# Patient Record
Sex: Male | Born: 1935 | Race: White | Hispanic: No | Marital: Married | Smoking: Former smoker
Health system: Southern US, Community
[De-identification: ages and names within clinical notes are randomized; demographics above are authoritative.]

## PROBLEM LIST (undated history)

## (undated) DIAGNOSIS — R1310 Dysphagia, unspecified: Secondary | ICD-10-CM

## (undated) DIAGNOSIS — I639 Cerebral infarction, unspecified: Secondary | ICD-10-CM

## (undated) DIAGNOSIS — J309 Allergic rhinitis, unspecified: Secondary | ICD-10-CM

## (undated) DIAGNOSIS — C801 Malignant (primary) neoplasm, unspecified: Secondary | ICD-10-CM

## (undated) DIAGNOSIS — Z87442 Personal history of urinary calculi: Secondary | ICD-10-CM

## (undated) DIAGNOSIS — I10 Essential (primary) hypertension: Secondary | ICD-10-CM

## (undated) DIAGNOSIS — K5790 Diverticulosis of intestine, part unspecified, without perforation or abscess without bleeding: Secondary | ICD-10-CM

## (undated) DIAGNOSIS — E119 Type 2 diabetes mellitus without complications: Secondary | ICD-10-CM

## (undated) DIAGNOSIS — M1990 Unspecified osteoarthritis, unspecified site: Secondary | ICD-10-CM

## (undated) DIAGNOSIS — K219 Gastro-esophageal reflux disease without esophagitis: Secondary | ICD-10-CM

## (undated) HISTORY — PX: TONSILLECTOMY: SUR1361

## (undated) HISTORY — DX: Cerebral infarction, unspecified: I63.9

## (undated) HISTORY — PX: LITHOTRIPSY: SUR834

## (undated) HISTORY — PX: STAPEDES SURGERY: SHX789

---

## 2004-11-25 DIAGNOSIS — I639 Cerebral infarction, unspecified: Secondary | ICD-10-CM

## 2004-11-25 HISTORY — DX: Cerebral infarction, unspecified: I63.9

## 2005-06-25 ENCOUNTER — Ambulatory Visit: Payer: Self-pay | Admitting: Gastroenterology

## 2006-07-15 ENCOUNTER — Ambulatory Visit: Payer: Self-pay | Admitting: Gastroenterology

## 2006-11-25 HISTORY — PX: SINUS SURGERY WITH INSTATRAK: SHX5215

## 2009-09-19 ENCOUNTER — Ambulatory Visit: Payer: Self-pay | Admitting: Urology

## 2010-03-13 ENCOUNTER — Ambulatory Visit: Payer: Self-pay | Admitting: General Practice

## 2010-09-14 ENCOUNTER — Ambulatory Visit: Payer: Self-pay | Admitting: Urology

## 2012-01-15 ENCOUNTER — Ambulatory Visit: Payer: Self-pay | Admitting: Gastroenterology

## 2012-01-29 ENCOUNTER — Ambulatory Visit: Payer: Self-pay | Admitting: Urology

## 2012-02-03 ENCOUNTER — Ambulatory Visit: Payer: Self-pay | Admitting: Urology

## 2012-02-05 ENCOUNTER — Ambulatory Visit: Payer: Self-pay | Admitting: Urology

## 2012-02-05 LAB — CREATININE, SERUM
Creatinine: 0.79 mg/dL (ref 0.60–1.30)
EGFR (Non-African Amer.): >60

## 2012-02-11 ENCOUNTER — Ambulatory Visit: Payer: Self-pay | Admitting: Urology

## 2012-02-14 LAB — PATHOLOGY REPORT

## 2012-05-29 ENCOUNTER — Ambulatory Visit: Payer: Self-pay | Admitting: Urology

## 2012-06-04 ENCOUNTER — Ambulatory Visit: Payer: Self-pay | Admitting: Urology

## 2012-06-18 ENCOUNTER — Ambulatory Visit: Payer: Self-pay | Admitting: Urology

## 2012-09-09 ENCOUNTER — Ambulatory Visit: Payer: Self-pay | Admitting: Urology

## 2012-09-09 DIAGNOSIS — N200 Calculus of kidney: Secondary | ICD-10-CM | POA: Insufficient documentation

## 2012-09-09 DIAGNOSIS — C672 Malignant neoplasm of lateral wall of bladder: Secondary | ICD-10-CM | POA: Insufficient documentation

## 2012-09-09 DIAGNOSIS — N401 Enlarged prostate with lower urinary tract symptoms: Secondary | ICD-10-CM | POA: Insufficient documentation

## 2012-09-09 DIAGNOSIS — D075 Carcinoma in situ of prostate: Secondary | ICD-10-CM | POA: Insufficient documentation

## 2012-09-09 DIAGNOSIS — N411 Chronic prostatitis: Secondary | ICD-10-CM | POA: Insufficient documentation

## 2012-09-09 DIAGNOSIS — R339 Retention of urine, unspecified: Secondary | ICD-10-CM | POA: Insufficient documentation

## 2012-09-09 DIAGNOSIS — R972 Elevated prostate specific antigen [PSA]: Secondary | ICD-10-CM | POA: Insufficient documentation

## 2012-12-09 ENCOUNTER — Ambulatory Visit: Payer: Self-pay | Admitting: Urology

## 2013-07-30 ENCOUNTER — Ambulatory Visit: Payer: Self-pay | Admitting: Urology

## 2013-12-29 DIAGNOSIS — N3941 Urge incontinence: Secondary | ICD-10-CM | POA: Insufficient documentation

## 2014-07-04 DIAGNOSIS — C679 Malignant neoplasm of bladder, unspecified: Secondary | ICD-10-CM | POA: Insufficient documentation

## 2014-07-04 DIAGNOSIS — E119 Type 2 diabetes mellitus without complications: Secondary | ICD-10-CM | POA: Insufficient documentation

## 2014-07-04 DIAGNOSIS — I10 Essential (primary) hypertension: Secondary | ICD-10-CM | POA: Insufficient documentation

## 2014-07-04 DIAGNOSIS — E1129 Type 2 diabetes mellitus with other diabetic kidney complication: Secondary | ICD-10-CM | POA: Insufficient documentation

## 2014-07-20 DIAGNOSIS — N029 Recurrent and persistent hematuria with unspecified morphologic changes: Secondary | ICD-10-CM | POA: Insufficient documentation

## 2014-08-23 DIAGNOSIS — K5730 Diverticulosis of large intestine without perforation or abscess without bleeding: Secondary | ICD-10-CM | POA: Insufficient documentation

## 2014-08-23 DIAGNOSIS — Z86010 Personal history of colonic polyps: Secondary | ICD-10-CM | POA: Insufficient documentation

## 2014-09-16 DIAGNOSIS — Z85828 Personal history of other malignant neoplasm of skin: Secondary | ICD-10-CM | POA: Insufficient documentation

## 2015-04-11 DIAGNOSIS — D075 Carcinoma in situ of prostate: Secondary | ICD-10-CM | POA: Insufficient documentation

## 2015-04-20 ENCOUNTER — Encounter: Payer: Self-pay | Admitting: *Deleted

## 2015-04-21 ENCOUNTER — Ambulatory Visit: Payer: Medicare Other | Admitting: Anesthesiology

## 2015-04-21 ENCOUNTER — Encounter
Admission: RE | Disposition: A | Discharge status: Home / Self Care | Payer: Self-pay | Source: Ambulatory Visit | Attending: Gastroenterology

## 2015-04-21 ENCOUNTER — Encounter: Payer: Self-pay | Admitting: *Deleted

## 2015-04-21 ENCOUNTER — Ambulatory Visit
Admission: RE | Admit: 2015-04-21 | Discharge: 2015-04-21 | Disposition: A | Discharge status: Home / Self Care | Payer: Medicare Other | Source: Ambulatory Visit | Attending: Gastroenterology | Admitting: Gastroenterology

## 2015-04-21 DIAGNOSIS — Z79899 Other long term (current) drug therapy: Secondary | ICD-10-CM | POA: Insufficient documentation

## 2015-04-21 DIAGNOSIS — Z8551 Personal history of malignant neoplasm of bladder: Secondary | ICD-10-CM | POA: Diagnosis not present

## 2015-04-21 DIAGNOSIS — Z8546 Personal history of malignant neoplasm of prostate: Secondary | ICD-10-CM | POA: Insufficient documentation

## 2015-04-21 DIAGNOSIS — E119 Type 2 diabetes mellitus without complications: Secondary | ICD-10-CM | POA: Insufficient documentation

## 2015-04-21 DIAGNOSIS — K3189 Other diseases of stomach and duodenum: Secondary | ICD-10-CM | POA: Diagnosis not present

## 2015-04-21 DIAGNOSIS — K5790 Diverticulosis of intestine, part unspecified, without perforation or abscess without bleeding: Secondary | ICD-10-CM | POA: Insufficient documentation

## 2015-04-21 DIAGNOSIS — I10 Essential (primary) hypertension: Secondary | ICD-10-CM | POA: Insufficient documentation

## 2015-04-21 DIAGNOSIS — Z87891 Personal history of nicotine dependence: Secondary | ICD-10-CM | POA: Insufficient documentation

## 2015-04-21 DIAGNOSIS — K219 Gastro-esophageal reflux disease without esophagitis: Secondary | ICD-10-CM | POA: Diagnosis not present

## 2015-04-21 DIAGNOSIS — R1310 Dysphagia, unspecified: Secondary | ICD-10-CM | POA: Insufficient documentation

## 2015-04-21 HISTORY — DX: Allergic rhinitis, unspecified: J30.9

## 2015-04-21 HISTORY — DX: Dysphagia, unspecified: R13.10

## 2015-04-21 HISTORY — PX: ESOPHAGOGASTRODUODENOSCOPY: SHX5428

## 2015-04-21 HISTORY — DX: Malignant (primary) neoplasm, unspecified: C80.1

## 2015-04-21 HISTORY — DX: Type 2 diabetes mellitus without complications: E11.9

## 2015-04-21 HISTORY — DX: Gastro-esophageal reflux disease without esophagitis: K21.9

## 2015-04-21 HISTORY — DX: Diverticulosis of intestine, part unspecified, without perforation or abscess without bleeding: K57.90

## 2015-04-21 HISTORY — DX: Essential (primary) hypertension: I10

## 2015-04-21 LAB — GLUCOSE, CAPILLARY: Glucose-Capillary: 169 mg/dL — ABNORMAL HIGH (ref 65–99)

## 2015-04-21 SURGERY — EGD (ESOPHAGOGASTRODUODENOSCOPY)
Anesthesia: General

## 2015-04-21 MED ORDER — FENTANYL CITRATE (PF) 100 MCG/2ML IJ SOLN
INTRAMUSCULAR | Status: DC | PRN
Start: 2015-04-21 — End: 2015-04-21
  Administered 2015-04-21: 50 ug via INTRAVENOUS

## 2015-04-21 MED ORDER — SODIUM CHLORIDE 0.9 % IV SOLN: INTRAVENOUS | Status: DC

## 2015-04-21 MED ORDER — SODIUM CHLORIDE 0.9 % IV SOLN
INTRAVENOUS | Status: DC
  Administered 2015-04-21: 08:00:00 via INTRAVENOUS

## 2015-04-21 MED ORDER — PROPOFOL INFUSION 10 MG/ML OPTIME
INTRAVENOUS | Status: DC | PRN
  Administered 2015-04-21: 100 ug/kg/min via INTRAVENOUS

## 2015-04-21 NOTE — Anesthesia Procedure Notes (Signed)
Performed by: Jonna Clark Pre-anesthesia Checklist: Patient identified, Emergency Drugs available, Suction available, Patient being monitored and Timeout performed Patient Re-evaluated:Patient Re-evaluated prior to inductionOxygen Delivery Method: Nasal cannula

## 2015-04-21 NOTE — Op Note (Signed)
[facility] Gastroenterology Patient Name: Casey Adkins Procedure Date: 04/21/2015 8:00 AM MRN: [id number] Account #: [id number] Date of Birth: 25-Apr-1934 Admit Type: Outpatient Age: 81 Room: [facility] ENDO ROOM 4 Gender: Male Note Status: Finalized Procedure:         Upper GI endoscopy Indications:       Dysphagia Providers:         Lupita Dawn. Candace Cruise, MD Referring MD:      Rusty Aus, MD (Referring MD) Medicines:         Monitored Anesthesia Care Complications:     No immediate complications. Procedure:         Pre-Anesthesia Assessment:                    - Prior to the procedure, a History and Physical was                     performed, and patient medications, allergies and                     sensitivities were reviewed. The patient's tolerance of                     previous anesthesia was reviewed.                    - The risks and benefits of the procedure and the sedation                     options and risks were discussed with the patient. All                     questions were answered and informed consent was obtained.                    - After reviewing the risks and benefits, the patient was                     deemed in satisfactory condition to undergo the procedure.                    After obtaining informed consent, the endoscope was passed                     under direct vision. Throughout the procedure, the                     patient's blood pressure, pulse, and oxygen saturations                     were monitored continuously. The Olympus GIF-160 endoscope                     (S#. [id number]) was introduced through the mouth, and                     advanced to the second part of duodenum. The upper GI                     endoscopy was accomplished without difficulty. The patient                     tolerated the procedure well. Findings:      No endoscopic abnormality was evident in the esophagus to explain the  patient's complaint of  dysphagia. It was decided, however, to proceed       with dilation of the entire esophagus. The scope was withdrawn. Dilation       was performed with a Maloney dilator with mild resistance at 79 Fr.      A single umbilicated lesion measuring small in diameter was found in the       gastric antrum. Biopsies were taken with a cold forceps for histology.      The exam was otherwise without abnormality.      The examined duodenum was normal. Impression:        - No endoscopic esophageal abnormality to explain                     patient's dysphagia. Esophagus dilated. Dilated.                    - A single lesion suspicious for aberrant pancreas was                     found in the stomach. Biopsied.                    - The examination was otherwise normal.                    - Normal examined duodenum. Recommendation:    - Discharge patient to home.                    - Observe patient's clinical course.                    - Await pathology results.                    - The findings and recommendations were discussed with the                     patient. Procedure Code(s): --- Professional ---                    [phone], Esophagogastroduodenoscopy, flexible, transoral;                     with biopsy, single or multiple                    43450, Dilation of esophagus, by unguided sound or bougie,                     single or multiple passes Diagnosis Code(s): --- Professional ---                    R13.10, Dysphagia, unspecified                    K31.9, Disease of stomach and duodenum, unspecified CPT copyright 2014 American Medical Association. All rights reserved. The codes documented in this report are preliminary and upon coder review may  be revised to meet current compliance requirements. Hulen Luster, MD 04/21/2015 8:22:25 AM This report has been signed electronically. Number of Addenda: 0 Note Initiated On: 04/21/2015 8:00 AM      [facility]

## 2015-04-21 NOTE — H&P (Signed)
Primary Care Physician:  Rusty Aus., MD Primary Gastroenterologist:  Dr. Candace Cruise  Pre-Procedure History & Physical: HPI:  Casey Adkins is a 81 y.o. male is here for an EGD with dilation.   Past Medical History  Diagnosis Date  . Hypertension   . Diabetes mellitus without complication   . GERD (gastroesophageal reflux disease)   . Diverticulosis   . Cancer     Bladder Cancer  . Cancer     Prostate Cancer  . Dysphagia   . Allergic rhinitis     Past Surgical History  Procedure Laterality Date  . Tonsillectomy      Prior to Admission medications   Medication Sig Start Date End Date Taking? Authorizing Provider  acetaminophen (TYLENOL) 500 MG tablet Take 500 mg by mouth every 6 (six) hours as needed for moderate pain.   Yes Historical Provider, MD  Calcium Carbonate-Vitamin D (CALCIUM-D) 600-400 MG-UNIT TABS Take 1 tablet by mouth daily.   Yes Historical Provider, MD  finasteride (PROSCAR) 5 MG tablet Take 1 tablet by mouth daily. 02/16/15  Yes Historical Provider, MD  glimepiride (AMARYL) 4 MG tablet Take 1 tablet by mouth daily before breakfast. 03/24/15  Yes Historical Provider, MD  lisinopril (PRINIVIL,ZESTRIL) 10 MG tablet Take 10 mg by mouth 2 (two) times daily.   Yes Historical Provider, MD  magnesium gluconate (MAGONATE) 500 MG tablet Take 500 mg by mouth daily.   Yes Historical Provider, MD  metFORMIN (GLUCOPHAGE) 500 MG tablet Take 1,000 mg by mouth 2 (two) times daily. 02/10/15  Yes Historical Provider, MD  Multiple Vitamins-Minerals (PRESERVISION AREDS PO) Take 1 tablet by mouth 2 (two) times daily.   Yes Historical Provider, MD  tamsulosin (FLOMAX) 0.4 MG CAPS capsule Take 1 capsule by mouth daily. 01/25/15  Yes Historical Provider, MD  fluticasone (FLONASE) 50 MCG/ACT nasal spray Place 2 sprays into both nostrils daily.    Historical Provider, MD  propranolol (INDERAL) 20 MG tablet Take 1 tablet by mouth 2 (two) times daily. 01/06/15   Historical Provider, MD     Allergies as of 04/07/2015 - Review Complete 04/07/2015  Allergen Reaction Noted  . Sulfur Swelling 04/07/2015    History reviewed. No pertinent family history.  History   Social History  . Marital Status: Married    Spouse Name: N/A  . Number of Children: N/A  . Years of Education: N/A   Occupational History  . Not on file.   Social History Main Topics  . Smoking status: Former Research scientist (life sciences)  . Smokeless tobacco: Never Used  . Alcohol Use: No  . Drug Use: No  . Sexual Activity: Not on file   Other Topics Concern  . Not on file   Social History Narrative    Review of Systems: See HPI, otherwise negative ROS  Physical Exam: BP 178/98 mmHg  Pulse 64  Temp(Src) 98.9 F (37.2 C) (Tympanic)  Resp 20  Ht 5\' 10"  (1.778 m)  Wt 95.255 kg (210 lb)  BMI 30.13 kg/m2  SpO2 98% General:   Alert,  pleasant and cooperative in NAD Head:  Normocephalic and atraumatic. Neck:  Supple; no masses or thyromegaly. Lungs:  Clear throughout to auscultation.    Heart:  Regular rate and rhythm. Abdomen:  Soft, nontender and nondistended. Normal bowel sounds, without guarding, and without rebound.   Neurologic:  Alert and  oriented x4;  grossly normal neurologically.  Impression/Plan: Casey Adkins is here for an EGD to be performed for dysphagia.  Risks,  benefits, limitations, and alternatives regarding EGD have been reviewed with the patient.  Questions have been answered.  All parties agreeable.   Kemontae Dunklee, Lupita Dawn, MD  04/21/2015, 8:02 AM

## 2015-04-21 NOTE — Transfer of Care (Signed)
Immediate Anesthesia Transfer of Care Note  Patient: Casey Adkins  Procedure(s) Performed: Procedure(s): ESOPHAGOGASTRODUODENOSCOPY (EGD) (N/A)  Patient Location: PACU and Endoscopy Unit  Anesthesia Type:General  Level of Consciousness: awake, alert  and oriented  Airway & Oxygen Therapy: Patient Spontanous Breathing and Patient connected to nasal cannula oxygen  Post-op Assessment: Report given to RN and Post -op Vital signs reviewed and stable  Post vital signs: Reviewed and stable  Last Vitals:  Filed Vitals:   04/21/15 0825  BP: 123/68  Pulse: 67  Temp: 36.8 C  Resp: 13    Complications: No apparent anesthesia complications

## 2015-04-21 NOTE — Anesthesia Postprocedure Evaluation (Signed)
  Anesthesia Post-op Note  Patient: Casey Adkins  Procedure(s) Performed: Procedure(s): ESOPHAGOGASTRODUODENOSCOPY (EGD) (N/A)  Anesthesia type:General  Patient location: PACU  Post pain: Pain level controlled  Post assessment: Post-op Vital signs reviewed, Patient's Cardiovascular Status Stable, Respiratory Function Stable, Patent Airway and No signs of Nausea or vomiting  Post vital signs: Reviewed and stable  Last Vitals:  Filed Vitals:   04/21/15 0825  BP: 123/68  Pulse: 67  Temp: 36.8 C  Resp: 13    Level of consciousness: awake, alert  and patient cooperative  Complications: No apparent anesthesia complications

## 2015-04-21 NOTE — Anesthesia Preprocedure Evaluation (Signed)
Anesthesia Evaluation  Patient identified by MRN, date of birth, ID band Patient awake    Reviewed: Allergy & Precautions, H&P , NPO status , Patient's Chart, lab work & pertinent test results, reviewed documented beta blocker date and time   Airway Mallampati: III  TM Distance: >3 FB Neck ROM: full    Dental no notable dental hx.    Pulmonary neg pulmonary ROS, former smoker,  breath sounds clear to auscultation  Pulmonary exam normal       Cardiovascular Exercise Tolerance: Good hypertension, negative cardio ROS  Rhythm:regular Rate:Normal     Neuro/Psych negative neurological ROS  negative psych ROS   GI/Hepatic negative GI ROS, Neg liver ROS, GERD-  ,  Endo/Other  negative endocrine ROSdiabetes  Renal/GU negative Renal ROS  negative genitourinary   Musculoskeletal   Abdominal   Peds  Hematology negative hematology ROS (+)   Anesthesia Other Findings   Reproductive/Obstetrics negative OB ROS                             Anesthesia Physical Anesthesia Plan  ASA: III  Anesthesia Plan: General   Post-op Pain Management:    Induction:   Airway Management Planned:   Additional Equipment:   Intra-op Plan:   Post-operative Plan:   Informed Consent: I have reviewed the patients History and Physical, chart, labs and discussed the procedure including the risks, benefits and alternatives for the proposed anesthesia with the patient or authorized representative who has indicated his/her understanding and acceptance.   Dental Advisory Given  Plan Discussed with: CRNA  Anesthesia Plan Comments:         Anesthesia Quick Evaluation

## 2015-04-25 ENCOUNTER — Encounter: Payer: Self-pay | Admitting: Gastroenterology

## 2015-04-25 LAB — SURGICAL PATHOLOGY

## 2016-05-10 ENCOUNTER — Encounter: Payer: Self-pay | Admitting: Emergency Medicine

## 2016-05-10 ENCOUNTER — Emergency Department
Admission: EM | Admit: 2016-05-10 | Discharge: 2016-05-10 | Disposition: A | Discharge status: Home / Self Care | Payer: Medicare Other | Attending: Emergency Medicine | Admitting: Emergency Medicine

## 2016-05-10 ENCOUNTER — Emergency Department: Payer: Medicare Other

## 2016-05-10 DIAGNOSIS — Z79899 Other long term (current) drug therapy: Secondary | ICD-10-CM | POA: Diagnosis not present

## 2016-05-10 DIAGNOSIS — E119 Type 2 diabetes mellitus without complications: Secondary | ICD-10-CM | POA: Diagnosis not present

## 2016-05-10 DIAGNOSIS — Z7984 Long term (current) use of oral hypoglycemic drugs: Secondary | ICD-10-CM | POA: Insufficient documentation

## 2016-05-10 DIAGNOSIS — M6289 Other specified disorders of muscle: Secondary | ICD-10-CM

## 2016-05-10 DIAGNOSIS — Z8551 Personal history of malignant neoplasm of bladder: Secondary | ICD-10-CM | POA: Diagnosis not present

## 2016-05-10 DIAGNOSIS — I10 Essential (primary) hypertension: Secondary | ICD-10-CM | POA: Insufficient documentation

## 2016-05-10 DIAGNOSIS — Z5181 Encounter for therapeutic drug level monitoring: Secondary | ICD-10-CM | POA: Diagnosis not present

## 2016-05-10 DIAGNOSIS — Z8546 Personal history of malignant neoplasm of prostate: Secondary | ICD-10-CM | POA: Diagnosis not present

## 2016-05-10 DIAGNOSIS — Z87891 Personal history of nicotine dependence: Secondary | ICD-10-CM | POA: Insufficient documentation

## 2016-05-10 DIAGNOSIS — R202 Paresthesia of skin: Secondary | ICD-10-CM | POA: Diagnosis present

## 2016-05-10 DIAGNOSIS — R531 Weakness: Secondary | ICD-10-CM | POA: Diagnosis not present

## 2016-05-10 DIAGNOSIS — R29898 Other symptoms and signs involving the musculoskeletal system: Secondary | ICD-10-CM

## 2016-05-10 LAB — CBC
HEMATOCRIT: 42.5 % (ref 40.0–52.0)
HEMOGLOBIN: 14.3 g/dL (ref 13.0–18.0)
MCH: 31.1 pg (ref 26.0–34.0)
MCHC: 33.7 g/dL (ref 32.0–36.0)
MCV: 92.2 fL (ref 80.0–100.0)
Platelets: 150 10*3/uL (ref 150–440)
RBC: 4.61 MIL/uL (ref 4.40–5.90)
RDW: 14.7 % — ABNORMAL HIGH (ref 11.5–14.5)
WBC: 7.4 10*3/uL (ref 3.8–10.6)

## 2016-05-10 LAB — DIFFERENTIAL
BASOS ABS: 0.1 10*3/uL (ref 0–0.1)
Basophils Relative: 1 %
EOS ABS: 0.2 10*3/uL (ref 0–0.7)
Eosinophils Relative: 3 %
LYMPHS ABS: 1.9 10*3/uL (ref 1.0–3.6)
LYMPHS PCT: 26 %
MONOS PCT: 5 %
Monocytes Absolute: 0.4 10*3/uL (ref 0.2–1.0)
NEUTROS ABS: 4.8 10*3/uL (ref 1.4–6.5)
NEUTROS PCT: 65 %

## 2016-05-10 LAB — COMPREHENSIVE METABOLIC PANEL
ALBUMIN: 3.9 g/dL (ref 3.5–5.0)
ALT: 23 U/L (ref 17–63)
AST: 32 U/L (ref 15–41)
Alkaline Phosphatase: 53 U/L (ref 38–126)
Anion gap: 10 (ref 5–15)
BILIRUBIN TOTAL: 0.7 mg/dL (ref 0.3–1.2)
BUN: 15 mg/dL (ref 6–20)
CO2: 29 mmol/L (ref 22–32)
CREATININE: 0.89 mg/dL (ref 0.61–1.24)
Calcium: 9.2 mg/dL (ref 8.9–10.3)
Chloride: 100 mmol/L — ABNORMAL LOW (ref 101–111)
GFR calc Af Amer: >60 mL/min (ref >60)
GLUCOSE: 278 mg/dL — AB (ref 65–99)
POTASSIUM: 4.9 mmol/L (ref 3.5–5.1)
Sodium: 139 mmol/L (ref 135–145)
TOTAL PROTEIN: 7.6 g/dL (ref 6.5–8.1)

## 2016-05-10 LAB — APTT: APTT: 31 s (ref 24–36)

## 2016-05-10 LAB — URINE DRUG SCREEN, QUALITATIVE (ARMC ONLY)
AMPHETAMINES, UR SCREEN: NOT DETECTED
BENZODIAZEPINE, UR SCRN: NOT DETECTED
Barbiturates, Ur Screen: NOT DETECTED
CANNABINOID 50 NG, UR ~~LOC~~: NOT DETECTED
Cocaine Metabolite,Ur ~~LOC~~: NOT DETECTED
MDMA (ECSTASY) UR SCREEN: NOT DETECTED
Methadone Scn, Ur: NOT DETECTED
OPIATE, UR SCREEN: NOT DETECTED
PHENCYCLIDINE (PCP) UR S: NOT DETECTED
Tricyclic, Ur Screen: NOT DETECTED

## 2016-05-10 LAB — PROTIME-INR
INR: 1
Prothrombin Time: 13.4 seconds (ref 11.4–15.0)

## 2016-05-10 LAB — TROPONIN I

## 2016-05-10 MED ORDER — ASPIRIN 81 MG PO CHEW
324.0000 mg | CHEWABLE_TABLET | Freq: Once | ORAL | Status: AC
  Administered 2016-05-10: 324 mg via ORAL
  Filled 2016-05-10: qty 4

## 2016-05-10 NOTE — ED Notes (Signed)
NAD noted at time of D/C. Pt denies questions or concerns. Pt ambulatory to the lobby at this time.  

## 2016-05-10 NOTE — ED Provider Notes (Signed)
[facility] Emergency Department Provider Note  ____________________________________________  Time seen: Approximately 10:42 AM  I have reviewed the triage vital signs and the nursing notes.   HISTORY  Chief Complaint Numbness    HPI Casey Adkins is a 82 y.o. male presents for evaluation of weakness in his left hand. This reports yesterday and possibly on Wednesday he noticed that his left hand fingers felt weak. Driving the car felt very strange, as though he can't fully use his left hand and wrist. He has not noticed any numbness, but he does report that the hand is not working correctly. He is also felt a little "off balance" the last couple of days. No headache or neck pain. No chest pain or shortness of breath. He otherwise feels okay, but is concerned because his wife told him she thinks she may have had a "stroke".  He is a history of high blood pressure and diabetes. He does not take any blood thinners or aspirin.   Past Medical History  Diagnosis Date  . Hypertension   . Diabetes mellitus without complication ([place])   . GERD (gastroesophageal reflux disease)   . Diverticulosis   . Cancer [facility])     Bladder Cancer  . Cancer [facility])     Prostate Cancer  . Dysphagia   . Allergic rhinitis     There are no active problems to display for this patient.   Past Surgical History  Procedure Laterality Date  . Tonsillectomy    . Esophagogastroduodenoscopy N/A 04/21/2015    Procedure: ESOPHAGOGASTRODUODENOSCOPY (EGD);  Surgeon: Hulen Luster, MD;  Location: [facility] ENDOSCOPY;  Service: Gastroenterology;  Laterality: N/A;    Current Outpatient Rx  Name  Route  Sig  Dispense  Refill  . acetaminophen (TYLENOL) 500 MG tablet   Oral   Take 500 mg by mouth every 6 (six) hours as needed for moderate pain.         . Calcium Carbonate-Vitamin D (CALCIUM-D) 600-400 MG-UNIT TABS   Oral   Take 1 tablet by mouth daily.         . finasteride (PROSCAR) 5 MG  tablet   Oral   Take 1 tablet by mouth daily.      3   . fluticasone (FLONASE) 50 MCG/ACT nasal spray   Each Nare   Place 2 sprays into both nostrils daily.         Marland Kitchen glimepiride (AMARYL) 4 MG tablet   Oral   Take 1 tablet by mouth daily before breakfast.      3   . lisinopril (PRINIVIL,ZESTRIL) 10 MG tablet   Oral   Take 10 mg by mouth 2 (two) times daily.         . magnesium gluconate (MAGONATE) 500 MG tablet   Oral   Take 500 mg by mouth daily.         . metFORMIN (GLUCOPHAGE) 500 MG tablet   Oral   Take 1,000 mg by mouth 2 (two) times daily.      12   . Multiple Vitamins-Minerals (PRESERVISION AREDS PO)   Oral   Take 1 tablet by mouth 2 (two) times daily.         . propranolol (INDERAL) 20 MG tablet   Oral   Take 1 tablet by mouth 2 (two) times daily.      3   . tamsulosin (FLOMAX) 0.4 MG CAPS capsule   Oral   Take 1 capsule by mouth daily.  3     Allergies Prilosec and Sulfur  History reviewed. No pertinent family history.  Social History Social History  Substance Use Topics  . Smoking status: Former Research scientist (life sciences)  . Smokeless tobacco: Never Used  . Alcohol Use: No    Review of Systems Constitutional: No fever/chills Eyes: No visual changes. ENT: No sore throat. Cardiovascular: Denies chest pain. Respiratory: Denies shortness of breath. Gastrointestinal: No abdominal pain.  No nausea, no vomiting.  No diarrhea.  No constipation. Genitourinary: Negative for dysuria. Musculoskeletal: Negative for back pain.See history of present illness regarding left arm. Skin: Negative for rash. Neurological: Negative for headaches or numbness.  10-point ROS otherwise negative.  ____________________________________________   PHYSICAL EXAM:  VITAL SIGNS: ED Triage Vitals  Enc Vitals Group     BP 05/10/16 1022 150/70 mmHg     Pulse Rate 05/10/16 1022 64     Resp 05/10/16 1022 18     Temp --      Temp src --      SpO2 05/10/16 1022 95 %      Weight 05/10/16 1022 215 lb (97.523 kg)     Height 05/10/16 1022 5\' 10"  (1.778 m)     Head Cir --      Peak Flow --      Pain Score 05/10/16 1023 0     Pain Loc --      Pain Edu? --      Excl. in [place]? --    Constitutional: Alert and oriented. Well appearing and in no acute distress. Eyes: Conjunctivae are normal. PERRL. EOMI. Head: Atraumatic. Nose: No congestion/rhinnorhea. Mouth/Throat: Mucous membranes are moist.  Oropharynx non-erythematous. Neck: No stridor.   Cardiovascular: Normal rate, regular rhythm. Grossly normal heart sounds.  Good peripheral circulation. Respiratory: Normal respiratory effort.  No retractions. Lungs CTAB. Gastrointestinal: Soft and nontender. No distention. No abdominal bruits. No CVA tenderness. Musculoskeletal: No lower extremity tenderness nor edema.  No joint effusions. Neurologic:  Normal speech and language.   NIH score equals 2, performed by me at bedside. The patient has mild left pronator drift. The patient has normal cranial nerve exam. Extraocular movements are normal. Visual fields are normal. Patient has 5 out of 5 strength in all extremities except minimal weakness in the left hand. There is no numbness or gross, acute sensory abnormality in the extremities bilaterally. No speech disturbance. No dysarthria. No aphasia. Mild left upper extremity ataxia.. Normal finger nose finger bilat. Patient speaking in full and clear sentences.   Skin:  Skin is warm, dry and intact. No rash noted. Psychiatric: Mood and affect are normal. Speech and behavior are normal.  ____________________________________________   LABS (all labs ordered are listed, but only abnormal results are displayed)  Labs Reviewed  CBC - Abnormal; Notable for the following:    RDW 14.7 (*)    All other components within normal limits  COMPREHENSIVE METABOLIC PANEL - Abnormal; Notable for the following:    Chloride 100 (*)    Glucose, Bld 278 (*)    All other  components within normal limits  PROTIME-INR  APTT  DIFFERENTIAL  TROPONIN I  URINE DRUG SCREEN, QUALITATIVE ([facility] ONLY)   ____________________________________________  EKG  ED ECG REPORT I, Mylin Hirano, the attending physician, personally viewed and interpreted this ECG.  Date: 05/10/2016 EKG Time: 10:30 Rate: 70 Rhythm: normal sinus rhythm QRS Axis: normal Intervals: normal ST/T Wave abnormalities: There is J-point elevation in V2 with no evidence of convexity Conduction Disturbances: none Narrative Interpretation:  unremarkable  Repeat EKG performed at 1430 Normal sinus rhythm, heart rate 70 Care is 100 PR 175 QTC 420 Again seen J-point elevation in V2, stable from previous. Patient complains of no chest pain. He has negative troponin and symptoms of numbness and weakness in the left handed been ongoing for greater than 2 days. I doubt cardiac disease is acute cause of today's symptoms  ____________________________________________  RADIOLOGY  CT Head Wo Contrast (Final result) Result time: 05/10/16 11:06:29   Final result by Rad Results In Interface (05/10/16 11:06:29)   Narrative:   CLINICAL DATA: Left hand numbness for 2 days, initial encounter  EXAM: CT HEAD WITHOUT CONTRAST  TECHNIQUE: Contiguous axial images were obtained from the base of the skull through the vertex without intravenous contrast.  COMPARISON: None.  FINDINGS: Bony calvarium is intact. Mild atrophic changes and chronic white matter ischemic change is noted. No findings to suggest acute hemorrhage, acute infarction or space occupying mass lesion are seen.  IMPRESSION: Chronic atrophic and ischemic changes. No acute abnormality noted.   Electronically Signed By: Inez Catalina M.D. On: 05/10/2016 11:06      DG Chest 2 View (Final result) Result time: 05/10/16 13:24:35   Final result by Rad Results In Interface (05/10/16 13:24:35)   Narrative:   CLINICAL DATA: Left  hand weakness for 2-3 days. History of bladder or prostate cancer. Evaluate for mass in left upper chest.  EXAM: CHEST 2 VIEW  COMPARISON: None.  FINDINGS: Lungs are clear without focal airspace disease or pulmonary edema. No evidence for a large mass. Heart and mediastinum are within normal limits. Atherosclerotic calcifications at the aortic arch. Multilevel degenerative changes in the thoracic spine. No large pleural effusions.  IMPRESSION: No active cardiopulmonary disease.   Electronically Signed By: Markus Daft M.D. On: 05/10/2016 13:24        ____________________________________________   PROCEDURES  Procedure(s) performed: None  Critical Care performed: No  Patient's presentation is well outside of TPA window for possible stroke. ____________________________________________   INITIAL IMPRESSION / ASSESSMENT AND PLAN / ED COURSE  Pertinent labs & imaging results that were available during my care of the patient were reviewed by me and considered in my medical decision making (see chart for details).  ----------------------------------------- 11:50 AM on 05/10/2016 -----------------------------------------  Consult placed to Dr. Doy Mince. Dr. Doy Mince advises that the patient could be discharged on baby aspirin to follow up closely in the clinic. The patient was offered admission for further evaluation versus discharge, and he strongly indicated that he wishes to follow-up with Dr. Sabra Heck and we'll start a baby aspirin daily. It is still unclear if he may have had a small infarct which is the present working diagnosis, or as the other possibility being perhaps a peripheral neuropathy of some type.  Patient is wife are both agreeable with the plan. He will not be driving, and he'll be following up closely with Dr. Sabra Heck and also provide neurology follow-up information.  Return precautions and treatment recommendations and follow-up discussed with the  patient who is agreeable with the plan.  ____________________________________________   FINAL CLINICAL IMPRESSION(S) / ED DIAGNOSES  Final diagnoses:  Weakness of left hand      Delman Kitten, MD 05/10/16 [phone]

## 2016-05-10 NOTE — ED Notes (Signed)
Pt reports feeling numbness in his left hand states that started yesterday.  Pt states he has feeling in his hand but sometimes feels like he has trouble grabbing things and using his hand.  Pt also states on Wednesday he wasn't feeling good and was little off balance.

## 2016-05-10 NOTE — ED Notes (Signed)
Dr. Doy Mince at bedside. Pt sitting up on end up bed, NAD noted.

## 2016-05-10 NOTE — Consult Note (Addendum)
Referring Physician: Quale    Chief Complaint: Left arm weakness  HPI: Casey Adkins is an 82 y.o. male with a history of DM, HTN who reports that on Wednesday his left hand just did not feel right.  By yesterday he noted that he had difficulty putting on his pants and using his left hand for fine motor movements.  Has been stable since that time.  Reports only minor sensory changes in the hand.  With no improvement today presented for evaluation.  Initial NIHSS of 1.    Date last known well: 05/07/2016 Time last known well: Unable to determine tPA Given: No: Outside time window  Past Medical History  Diagnosis Date  . Hypertension   . Diabetes mellitus without complication ([place])   . GERD (gastroesophageal reflux disease)   . Diverticulosis   . Cancer [facility])     Bladder Cancer  . Cancer [facility])     Prostate Cancer  . Dysphagia   . Allergic rhinitis     Past Surgical History  Procedure Laterality Date  . Tonsillectomy    . Esophagogastroduodenoscopy N/A 04/21/2015    Procedure: ESOPHAGOGASTRODUODENOSCOPY (EGD);  Surgeon: Hulen Luster, MD;  Location: [facility] ENDOSCOPY;  Service: Gastroenterology;  Laterality: N/A;    Family history:  Father with alcohol abuse.  Mother with uterine cancer.  Social History:  reports that he has quit smoking. He has never used smokeless tobacco. He reports that he does not drink alcohol or use illicit drugs.  Allergies:  Allergies  Allergen Reactions  . Prilosec [Omeprazole]     GI sxs  . Sulfur Swelling    In throat and tongue     Medications: I have reviewed the patient's current medications. Prior to Admission:  Prior to Admission medications   Medication Sig Start Date End Date Taking? Authorizing Provider  acetaminophen (TYLENOL) 500 MG tablet Take 500 mg by mouth every 6 (six) hours as needed for moderate pain.    Historical Provider, MD  Calcium Carbonate-Vitamin D (CALCIUM-D) 600-400 MG-UNIT TABS Take 1 tablet by mouth daily.    Historical  Provider, MD  finasteride (PROSCAR) 5 MG tablet Take 1 tablet by mouth daily. 02/16/15   Historical Provider, MD  fluticasone (FLONASE) 50 MCG/ACT nasal spray Place 2 sprays into both nostrils daily.    Historical Provider, MD  glimepiride (AMARYL) 4 MG tablet Take 1 tablet by mouth daily before breakfast. 03/24/15   Historical Provider, MD  lisinopril (PRINIVIL,ZESTRIL) 10 MG tablet Take 10 mg by mouth 2 (two) times daily.    Historical Provider, MD  magnesium gluconate (MAGONATE) 500 MG tablet Take 500 mg by mouth daily.    Historical Provider, MD  metFORMIN (GLUCOPHAGE) 500 MG tablet Take 1,000 mg by mouth 2 (two) times daily. 02/10/15   Historical Provider, MD  Multiple Vitamins-Minerals (PRESERVISION AREDS PO) Take 1 tablet by mouth 2 (two) times daily.    Historical Provider, MD  propranolol (INDERAL) 20 MG tablet Take 1 tablet by mouth 2 (two) times daily. 01/06/15   Historical Provider, MD  tamsulosin (FLOMAX) 0.4 MG CAPS capsule Take 1 capsule by mouth daily. 01/25/15   Historical Provider, MD    ROS: History obtained from the patient  General ROS:  fatigue Psychological ROS: negative for - behavioral disorder, hallucinations, memory difficulties, mood swings or suicidal ideation Ophthalmic ROS: negative for - blurry vision, double vision, eye pain or loss of vision ENT ROS: negative for - epistaxis, nasal discharge, oral lesions, sore throat, tinnitus or  vertigo Allergy and Immunology ROS: negative for - hives or itchy/watery eyes Hematological and Lymphatic ROS: negative for - bleeding problems, bruising or swollen lymph nodes Endocrine ROS: negative for - galactorrhea, hair pattern changes, polydipsia/polyuria or temperature intolerance Respiratory ROS: negative for - cough, hemoptysis, shortness of breath or wheezing Cardiovascular ROS: negative for - chest pain, dyspnea on exertion, edema or irregular heartbeat Gastrointestinal ROS: negative for - abdominal pain, diarrhea,  hematemesis, nausea/vomiting or stool incontinence Genito-Urinary ROS: negative for - dysuria, hematuria, incontinence or urinary frequency/urgency Musculoskeletal ROS: negative for - joint swelling or muscular weakness Neurological ROS: as noted in HPI Dermatological ROS: negative for rash and skin lesion changes  Physical Examination: Blood pressure 150/70, pulse 72, resp. rate 18, height 5\' 10"  (1.778 m), weight 97.523 kg (215 lb), SpO2 96 %.  HEENT-  Normocephalic, no lesions, without obvious abnormality.  Normal external eye and conjunctiva.  Normal TM's bilaterally.  Normal auditory canals and external ears. Normal external nose, mucus membranes and septum.  Normal pharynx. Cardiovascular- S1, S2 normal, pulses palpable throughout   Lungs- chest clear, no wheezing, rales, normal symmetric air entry Abdomen- soft, non-tender; bowel sounds normal; no masses,  no organomegaly Extremities- no edema Lymph-no adenopathy palpable Musculoskeletal-no joint tenderness, deformity or swelling Skin-warm and dry, no hyperpigmentation, vitiligo, or suspicious lesions  Neurological Examination Mental Status: Alert, oriented, thought content appropriate.  Speech fluent without evidence of aphasia.  Able to follow 3 step commands without difficulty. Cranial Nerves: II: Discs flat bilaterally; Visual fields grossly normal, pupils equal, round, reactive to light and accommodation III,IV, VI: ptosis not present, extra-ocular motions intact bilaterally V,VII: smile symmetric, facial light touch sensation normal bilaterally VIII: hearing normal bilaterally IX,X: gag reflex present XI: bilateral shoulder shrug XII: midline tongue extension Motor: Patient with 5-/5 strength in the deltoids bilaterally.  5/5 otherwise in the RUE.  5/5 in the LUE otherwise excluding the finger extensors at 3-4/5.    Sensory: Pinprick and light touch intact throughout, bilaterally Deep Tendon Reflexes: 1+ in the upper  extremities and absent in the lower extremities.   Plantars: Right: mute   Left: mute Cerebellar: Normal finger-to-nose and normal heel-to-shin testing bilaterally   Laboratory Studies:  Basic Metabolic Panel:  Recent Labs Lab 05/10/16 1043  NA 139  K 4.9  CL 100*  CO2 29  GLUCOSE 278*  BUN 15  CREATININE 0.89  CALCIUM 9.2    Liver Function Tests:  Recent Labs Lab 05/10/16 1043  AST 32  ALT 23  ALKPHOS 53  BILITOT 0.7  PROT 7.6  ALBUMIN 3.9   No results for input(s): LIPASE, AMYLASE in the last 168 hours. No results for input(s): AMMONIA in the last 168 hours.  CBC:  Recent Labs Lab 05/10/16 1043  WBC 7.4  NEUTROABS 4.8  HGB 14.3  HCT 42.5  MCV 92.2  PLT 150    Cardiac Enzymes:  Recent Labs Lab 05/10/16 1043  TROPONINI <0.03    BNP: Invalid input(s): POCBNP  CBG: No results for input(s): GLUCAP in the last 168 hours.  Microbiology: No results found for this or any previous visit.  Coagulation Studies:  Recent Labs  05/10/16 1043  LABPROT 13.4  INR 1.00    Urinalysis: No results for input(s): COLORURINE, LABSPEC, PHURINE, GLUCOSEU, HGBUR, BILIRUBINUR, KETONESUR, PROTEINUR, UROBILINOGEN, NITRITE, LEUKOCYTESUR in the last 168 hours.  Invalid input(s): APPERANCEUR  Lipid Panel: No results found for: CHOL, TRIG, HDL, CHOLHDL, VLDL, LDLCALC  HgbA1C: No results found for: HGBA1C  Urine  Drug Screen:     Component Value Date/Time   LABOPIA NONE DETECTED 05/10/2016 1043   COCAINSCRNUR NONE DETECTED 05/10/2016 1043   LABBENZ NONE DETECTED 05/10/2016 1043   AMPHETMU NONE DETECTED 05/10/2016 1043   THCU NONE DETECTED 05/10/2016 1043   LABBARB NONE DETECTED 05/10/2016 1043    Alcohol Level: No results for input(s): ETH in the last 168 hours.  Imaging: Ct Head Wo Contrast  05/10/2016  CLINICAL DATA:  Left hand numbness for 2 days, initial encounter EXAM: CT HEAD WITHOUT CONTRAST TECHNIQUE: Contiguous axial images were obtained from  the base of the skull through the vertex without intravenous contrast. COMPARISON:  None. FINDINGS: Bony calvarium is intact. Mild atrophic changes and chronic white matter ischemic change is noted. No findings to suggest acute hemorrhage, acute infarction or space occupying mass lesion are seen. IMPRESSION: Chronic atrophic and ischemic changes.  No acute abnormality noted. Electronically Signed   By: Inez Catalina M.D.   On: 05/10/2016 11:06    Assessment: 82 y.o. male presenting with left hand weakness and mild numbness.  Head CT personally reviewed and shows no acute changes.  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  Differential includes small infarct versus compressive nerve injury.  Patient with severe claustrophobia and reports that he is even unable to lie down on the stretcher for prolonged periods of time.  Option given for inpatient or outpatient work up and patient prefers this option.  On no antiplatelet therapy at home.   Last A1c (01/10/2016) was 9.5.  Stroke Risk Factors - diabetes mellitus and hypertension  Plan: 1. Patient to start ASA 325mg  daily 2. OT evaluation as an outpatient.  Patient to keep pressure off forearms. 3. Echocardiogram and carotid dopplers as an outpatient 4. Blood sugar management 5. Patient to return if worsening of symptoms Case discussed with Dr. Fortino Sic, MD Neurology [phone] 05/10/2016, 12:17 PM

## 2016-05-10 NOTE — Discharge Instructions (Signed)
Please start a baby aspirin (81mg  )daily.  Follow-up closely with Dr. Sabra Heck next week, as you may have had a small stroke. We also recommend he follow up with neurology. Please call both the schedule upcoming appointments.  Call your doctor or return to the ED if you have a headache, sudden and severe headache, confusion, slurred speech, facial droop, weakness or numbness in any arm or leg, extreme fatigue, vision problems, or other symptoms that concern you.

## 2016-05-14 ENCOUNTER — Other Ambulatory Visit: Payer: Self-pay | Admitting: Internal Medicine

## 2016-05-14 DIAGNOSIS — I639 Cerebral infarction, unspecified: Secondary | ICD-10-CM

## 2016-05-14 DIAGNOSIS — Z8673 Personal history of transient ischemic attack (TIA), and cerebral infarction without residual deficits: Secondary | ICD-10-CM | POA: Insufficient documentation

## 2016-05-17 ENCOUNTER — Ambulatory Visit: Payer: Medicare Other

## 2016-05-17 ENCOUNTER — Ambulatory Visit
Admission: RE | Admit: 2016-05-17 | Discharge: 2016-05-17 | Disposition: A | Discharge status: Home / Self Care | Payer: Medicare Other | Source: Ambulatory Visit | Attending: Internal Medicine | Admitting: Internal Medicine

## 2016-05-17 DIAGNOSIS — I639 Cerebral infarction, unspecified: Secondary | ICD-10-CM | POA: Diagnosis not present

## 2016-07-17 DIAGNOSIS — E538 Deficiency of other specified B group vitamins: Secondary | ICD-10-CM | POA: Insufficient documentation

## 2016-07-17 DIAGNOSIS — Z0000 Encounter for general adult medical examination without abnormal findings: Secondary | ICD-10-CM | POA: Insufficient documentation

## 2016-11-20 DIAGNOSIS — D099 Carcinoma in situ, unspecified: Secondary | ICD-10-CM | POA: Insufficient documentation

## 2016-12-25 DIAGNOSIS — R8281 Pyuria: Secondary | ICD-10-CM | POA: Insufficient documentation

## 2016-12-31 ENCOUNTER — Encounter: Payer: Self-pay | Admitting: Urology

## 2016-12-31 ENCOUNTER — Ambulatory Visit: Payer: Medicare Other | Admitting: Urology

## 2016-12-31 VITALS — BP 153/74 | HR 75 | Ht 70.0 in | Wt 220.8 lb

## 2016-12-31 DIAGNOSIS — Z87442 Personal history of urinary calculi: Secondary | ICD-10-CM

## 2016-12-31 DIAGNOSIS — N401 Enlarged prostate with lower urinary tract symptoms: Secondary | ICD-10-CM | POA: Diagnosis not present

## 2016-12-31 DIAGNOSIS — N3000 Acute cystitis without hematuria: Secondary | ICD-10-CM

## 2016-12-31 DIAGNOSIS — D090 Carcinoma in situ of bladder: Secondary | ICD-10-CM

## 2016-12-31 DIAGNOSIS — R350 Frequency of micturition: Secondary | ICD-10-CM

## 2016-12-31 LAB — URINALYSIS, COMPLETE
BILIRUBIN UA: NEGATIVE
Glucose, UA: NEGATIVE
Nitrite, UA: NEGATIVE
SPEC GRAV UA: 1.025 (ref 1.005–1.030)
UUROB: 0.2 mg/dL (ref 0.2–1.0)
pH, UA: 6.5 (ref 5.0–7.5)

## 2016-12-31 LAB — MICROSCOPIC EXAMINATION

## 2016-12-31 NOTE — Patient Instructions (Signed)
Bacillus Calmette-Guerin Live, BCG intravesical solution What is this medicine? BACILLUS CALMETTE-GUERIN LIVE, BCG (ba SIL us KAL met gay RAYN) is a bacteria solution. This medicine stimulates the immune system to ward off cancer cells. It is used to treat bladder cancer. This medicine may be used for other purposes; ask your health care provider or pharmacist if you have questions. COMMON BRAND NAME(S): Theracys, TICE BCG What should I tell my health care provider before I take this medicine? They need to know if you have any of these conditions: -aneurysm -blood in the urine -bladder biopsy within 2 weeks -fever or infection -immune system problems -leukemia -lymphoma -myasthenia gravis -need organ transplant -prosthetic device like arterial graft, artificial joint, prosthetic heart valve -recent or ongoing radiation therapy -tuberculosis -an unusual or allergic reaction to Bacillus Calmette-Guerin Live, BCG, latex, other medicines, foods, dyes, or preservatives -pregnant or trying to get pregnant -breast-feeding How should I use this medicine? This drug is given as a catheter infusion into the bladder. It is administered in a hospital or clinic by a specially trained health care professional. You will be given directions to follow before the treatment. Follow your doctor's directions carefully. Try to hold this medicine in your bladder for 2 hours after treatment. Talk to your pediatrician regarding the use of this medicine in children. Special care may be needed. Overdosage: If you think you have taken too much of this medicine contact a poison control center or emergency room at once. NOTE: This medicine is only for you. Do not share this medicine with others. What if I miss a dose? It is important not to miss your dose. Call your doctor or health care professional if you are unable to keep an appointment. What may interact with this medicine? -antibiotics -medicines to suppress  your immune system like chemotherapy agents or corticosteroids -medicine to treat tuberculosis This list may not describe all possible interactions. Give your health care provider a list of all the medicines, herbs, non-prescription drugs, or dietary supplements you use. Also tell them if you smoke, drink alcohol, or use illegal drugs. Some items may interact with your medicine. What should I watch for while using this medicine? Visit your doctor for checks on your progress. This drug may make you feel generally unwell. Contact your doctor if your symptoms last more than 2 days or if they get worse. Call your doctor right away if you have a severe or unusual symptom. Infection can be spread to others through contact with this medicine. To prevent the spread of infection follow your doctor's directions carefully after treatment. For the first 6 hours after each treatment, sit down on the toilet to urinate. After urinating, add 2 cups of bleach to the toilet bowl and let set for 15 minutes before flushing. Wash your hands before and after using the restroom. Drink water or other fluids as directed after treatment with this medicine. Do not become pregnant while taking this medicine. Women should inform their doctor if they wish to become pregnant or think they might be pregnant. There is a potential for serious side effects to an unborn child. Talk to your health care professional or pharmacist for more information. Do not breast-feed an infant while taking this medicine. What side effects may I notice from receiving this medicine? Side effects that you should report to your doctor or health care professional as soon as possible: -allergic reactions like skin rash, itching or hives, swelling of the face, lips, or tongue -signs of   infection - fever or chills, cough, sore throat, pain or difficulty passing urine -signs of decreased red blood cells - unusually weak or tired, fainting spells,  lightheadedness -blood in urine -breathing problems -cough -eye pain, redness -flu-like symptoms -joint pain -bladder-area pain for more than 2 days after treatment -trouble passing urine or change in the amount of urine -vomiting -yellowing of the eyes or skin Side effects that usually do not require medical attention (report to your doctor or health care professional if they continue or are bothersome): -bladder spasm -burning when passing urine within 2 days of treatment -feel need to pass urine often or wake up at night to pass urine -loss of appetite This list may not describe all possible side effects. Call your doctor for medical advice about side effects. You may report side effects to FDA at 1-800-FDA-1088. Where should I keep my medicine? This drug is given in a hospital or clinic and will not be stored at home. NOTE: This sheet is a summary. It may not cover all possible information. If you have questions about this medicine, talk to your doctor, pharmacist, or health care provider.  2017 Elsevier/Gold Standard (2015-12-14 10:33:35)

## 2016-12-31 NOTE — Progress Notes (Signed)
12/31/2016 5:50 PM   Casey Adkins 20-Sep-1934 [id number]  Referring provider: Rusty Aus, MD [place] [facility] [place], [place] [address]  Chief Complaint  Patient presents with  . New Patient (Initial Visit)    Bladder cancer    HPI: 83 year old male with a history of bladder cancer who presents today transferring his care from Dr. Jacqlyn Larsen due to transportation issues.    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  Most recent recurrence of CIS diagnosed on office biopsy on 09/24/2016. He underwent follow-up fulguration of this lesion in the office on 11/20/2016 with mitomycin.    He has since started BCG and has had 2 instillations thus far.  This course was started on 12/04/2016 and a second dose was administered on 12/18/2016.  His subsequent dose was held in the setting of urinary frequency, burning with urination, temp to 99, and malaise.  He was treated for urinary tract infection and further treatments were deferred.  He is currently on Cipro started on 12/30/2016.  Despite being in the middle of a course of BCG, he is interested in transferring his care to [facility]. He reports that he is frustrated with atrophic and driving on the highways and no longer willing to drive to [place].  No recent upper tract imaging.  History of kidney stones ESWL x 1 several years ago.   No recent flank pain or stone episodes.    BPH Most recent PSA 1.08 on 2015.   Managed on Flomax/finasteride  Symptoms primarily urgency/frequency.   PMH: Past Medical History:  Diagnosis Date  . Allergic rhinitis   . Cancer [facility])    Bladder Cancer  . Cancer [facility])    Prostate Cancer  . Diabetes mellitus without complication ([place])   . Diverticulosis   . Dysphagia   . GERD (gastroesophageal reflux disease)   . Hypertension   . Stroke [facility])      Surgical History: Past Surgical History:  Procedure Laterality Date  . ESOPHAGOGASTRODUODENOSCOPY N/A 04/21/2015   Procedure: ESOPHAGOGASTRODUODENOSCOPY (EGD);  Surgeon: Hulen Luster, MD;  Location: [facility] ENDOSCOPY;  Service: Gastroenterology;  Laterality: N/A;  . TONSILLECTOMY      Home Medications:  Allergies as of 12/31/2016      Reactions   Prilosec [omeprazole]    GI sxs   Sulfur Swelling   In throat and tongue       Medication List       Accurate as of 12/31/16 11:59 PM. Always use your most recent med list.          acetaminophen 500 MG tablet Commonly known as:  TYLENOL Take 500 mg by mouth every 6 (six) hours as needed for moderate pain.   Calcium-D 600-400 MG-UNIT Tabs Take 1 tablet by mouth daily.   ciprofloxacin 500 MG tablet Commonly known as:  CIPRO Take 500 mg by mouth 2 (two) times daily.   finasteride 5 MG tablet Commonly known as:  PROSCAR Take 1 tablet by mouth daily.   fluticasone 50 MCG/ACT nasal spray Commonly known as:  FLONASE Place 2 sprays into both nostrils daily.   glimepiride 4 MG tablet Commonly known as:  AMARYL Take 1 tablet by mouth daily before breakfast.   lisinopril 10 MG tablet Commonly known as:  PRINIVIL,ZESTRIL Take 10 mg by mouth 2 (two) times daily.   magnesium gluconate 500 MG tablet Commonly known as:  MAGONATE  Take 500 mg by mouth daily.   metFORMIN 500 MG tablet Commonly known as:  GLUCOPHAGE Take 1,000 mg by mouth 2 (two) times daily.   PRESERVISION AREDS PO Take 1 tablet by mouth 2 (two) times daily.   propranolol 20 MG tablet Commonly known as:  INDERAL Take 1 tablet by mouth 2 (two) times daily.   tamsulosin 0.4 MG Caps capsule Commonly known as:  FLOMAX Take 1 capsule by mouth daily.   VITAMIN B 12 PO Take by mouth.       Allergies:  Allergies  Allergen Reactions  . Prilosec [Omeprazole]     GI sxs  . Sulfur Swelling    In throat and tongue     Family History: Family History   Problem Relation Age of Onset  . Prostate cancer Neg Hx   . Bladder Cancer Neg Hx   . Kidney cancer Neg Hx     Social History:  reports that he has quit smoking. He has never used smokeless tobacco. He reports that he does not drink alcohol or use drugs.  ROS: UROLOGY Frequent Urination?: Yes Hard to postpone urination?: Yes Burning/pain with urination?: No Get up at night to urinate?: Yes Leakage of urine?: No Urine stream starts and stops?: No Trouble starting stream?: No Do you have to strain to urinate?: No Blood in urine?: No Urinary tract infection?: No Sexually transmitted disease?: No Injury to kidneys or bladder?: No Painful intercourse?: No Weak stream?: No Erection problems?: Yes Penile pain?: No  Gastrointestinal Nausea?: No Vomiting?: No Indigestion/heartburn?: No Diarrhea?: No Constipation?: No  Constitutional Fever: No Night sweats?: No Weight loss?: No Fatigue?: No  Skin Skin rash/lesions?: No Itching?: No  Eyes Blurred vision?: No Double vision?: No  Ears/Nose/Throat Sore throat?: No Sinus problems?: Yes  Hematologic/Lymphatic Swollen glands?: No Easy bruising?: No  Cardiovascular Leg swelling?: No Chest pain?: No  Respiratory Cough?: No Shortness of breath?: No  Endocrine Excessive thirst?: No  Musculoskeletal Back pain?: No Joint pain?: Yes  Neurological Headaches?: No Dizziness?: No  Psychologic Depression?: No Anxiety?: No  Physical Exam: BP (!) 153/74 (BP Location: Left Arm, Patient Position: Sitting, Cuff Size: Large)   Pulse 75   Ht 5\' 10"  (1.778 m)   Wt 220 lb 12.8 oz (100.2 kg)   BMI 31.68 kg/m   Constitutional:  Alert and oriented, No acute distress.  Accompanied by wife today. Mildly agitated. HEENT: [place] AT, moist mucus membranes.  Trachea midline, no masses.  Severely hard of hearing. Cardiovascular: No clubbing, cyanosis, or edema. Respiratory: Normal respiratory effort, no increased work of  breathing. GI: Abdomen is soft, nontender, nondistended, no abdominal masses GU: No CVA tenderness. Skin: No rashes, bruises or suspicious lesions. Neurologic: Grossly intact, no focal deficits, moving all 4 extremities. Psychiatric: Normal mood and affect.  Laboratory Data: Lab Results  Component Value Date   WBC 7.4 05/10/2016   HGB 14.3 05/10/2016   HCT 42.5 05/10/2016   MCV 92.2 05/10/2016   PLT 150 05/10/2016    Lab Results  Component Value Date   CREATININE 0.89 05/10/2016   Urinalysis Results for orders placed or performed in visit on 12/31/16  Microscopic Examination  Result Value Ref Range   WBC, UA 11-30 (A) 0 - 5 /hpf   RBC, UA 11-30 (A) 0 - 2 /hpf   Epithelial Cells (non renal) 0-10 0 - 10 /hpf   Bacteria, UA Few None seen/Few  Urinalysis, Complete  Result Value Ref Range   Specific Gravity, UA  1.025 1.005 - 1.030   pH, UA 6.5 5.0 - 7.5   Color, UA Yellow Yellow   Appearance Ur Cloudy (A) Clear   Leukocytes, UA Trace (A) Negative   Protein, UA 1+ (A) Negative/Trace   Glucose, UA Negative Negative   Ketones, UA Trace (A) Negative   RBC, UA 1+ (A) Negative   Bilirubin, UA Negative Negative   Urobilinogen, Ur 0.2 0.2 - 1.0 mg/dL   Nitrite, UA Negative Negative   Microscopic Examination See below:     Pertinent Imaging: No recent imaging  Assessment & Plan:    1. CIS (carcinoma in situ of bladder) Pathology was reviewed today along with the patient's history. Ideally, the patient would complete his current course of BCG with his current urologist but the patient adamantly refuses. In order to not further delay his induction course, I agreed to give him his 4 remaining treatments. He be placed on maintenance thereafter in this course was discussed. BCG risk reviewed in detail, info given. After his treatments, we will follow-up with office cystoscopy, cytology and upper tract imaging. - Urinalysis, Complete - CULTURE, URINE COMPREHENSIVE  2. History  of kidney stones Currently asymptomatic, no recent stone episodes We'll continue to follow  3. Benign prostatic hyperplasia with urinary frequency Continue Flomax/finasteride  4. Acute cystitis without hematuria Recent UTI, currently on Cipro UA appears improved but not completely resolve. Urine culture repeated today Tentatively plan on initiating BCG on Friday pending UA/ UCx   Return for BCG x 4 then cysto in 3 months after course complete.  Hollice Espy, MD  [facility] [address], [place], [place] [address] [phone]

## 2017-01-01 ENCOUNTER — Telehealth: Payer: Self-pay

## 2017-01-01 NOTE — Telephone Encounter (Signed)
Spoke with pt wife in reference to urine being sent for culture and calling prior to appt on Friday. Pt voiced understanding.

## 2017-01-01 NOTE — Telephone Encounter (Signed)
-----   Message from Hollice Espy, MD sent at 12/31/2016  8:40 PM EST ----- Please let this patient know that his urinalysis was mildly suspicious with a small amount of red blood cells and white blood cells which may be a sign of infection versus irritation from previous BCG. As such, we have sent a urine culture which should be back by Friday before BCG.  I would recommend having him call on Friday morning before he drives in for his urine culture results to ensure that we would be willing to administer BCG.  Hollice Espy, MD

## 2017-01-03 ENCOUNTER — Encounter: Payer: Self-pay | Admitting: Urology

## 2017-01-03 ENCOUNTER — Telehealth: Payer: Self-pay

## 2017-01-03 ENCOUNTER — Ambulatory Visit: Payer: Medicare Other | Admitting: Urology

## 2017-01-03 VITALS — BP 172/80 | HR 68 | Ht 70.0 in | Wt 221.4 lb

## 2017-01-03 DIAGNOSIS — D090 Carcinoma in situ of bladder: Secondary | ICD-10-CM | POA: Diagnosis not present

## 2017-01-03 LAB — CULTURE, URINE COMPREHENSIVE

## 2017-01-03 LAB — MICROSCOPIC EXAMINATION: Bacteria, UA: NONE SEEN

## 2017-01-03 LAB — URINALYSIS, COMPLETE
Bilirubin, UA: NEGATIVE
KETONES UA: NEGATIVE
LEUKOCYTES UA: NEGATIVE
Nitrite, UA: NEGATIVE
Urobilinogen, Ur: 0.2 mg/dL (ref 0.2–1.0)
pH, UA: 5 (ref 5.0–7.5)

## 2017-01-03 MED ORDER — BCG LIVE 50 MG IS SUSR
3.2400 mL | Freq: Once | INTRAVESICAL | Status: AC
  Administered 2017-01-03: 81 mg via INTRAVESICAL

## 2017-01-03 NOTE — Telephone Encounter (Signed)
Pt wife called requesting urine culture results because the her husband have an appt scheduled for today for a BCG treatment. I informed her to keep the appt because urine culture was negative.

## 2017-01-03 NOTE — Progress Notes (Signed)
UA reviewed, 6-10 WBC, 11-30 RBC, nitrite negative, no bacteria.  UCx from 12/31/16 negative.  Currently on cipro.  No gross hematuria, asymptomatic.    OK to give BCG today, dose #3/6.  Instructions reviewed with patient today.   Assisted in installation of BCG with catheter placement and injection of medication.  Hollice Espy, MD

## 2017-01-03 NOTE — Progress Notes (Signed)
BCG Bladder Instillation  BCG #3  Due to Bladder Cancer patient is present today for a BCG treatment. Patient was cleaned and prepped in a sterile fashion with betadine and lidocaine 2% jelly was instilled into the urethra.  A 14 FR catheter was inserted, urine return was noted 20 ml, urine was yellow in color.  71ml of reconstituted BCG was instilled into the bladder. The catheter was then removed. Patient tolerated well, no complications were noted  Preformed by: [place]   Follow up/ Additional notes: 1 week

## 2017-01-09 ENCOUNTER — Ambulatory Visit: Payer: Medicare Other | Admitting: Urology

## 2017-01-09 ENCOUNTER — Encounter: Payer: Self-pay | Admitting: Urology

## 2017-01-09 VITALS — BP 144/78 | HR 65 | Ht 70.0 in | Wt 220.5 lb

## 2017-01-09 DIAGNOSIS — N401 Enlarged prostate with lower urinary tract symptoms: Secondary | ICD-10-CM

## 2017-01-09 DIAGNOSIS — R350 Frequency of micturition: Secondary | ICD-10-CM | POA: Diagnosis not present

## 2017-01-09 DIAGNOSIS — C679 Malignant neoplasm of bladder, unspecified: Secondary | ICD-10-CM | POA: Diagnosis not present

## 2017-01-09 DIAGNOSIS — Z87442 Personal history of urinary calculi: Secondary | ICD-10-CM

## 2017-01-09 LAB — URINALYSIS, COMPLETE
BILIRUBIN UA: NEGATIVE
KETONES UA: NEGATIVE
NITRITE UA: NEGATIVE
PH UA: 5 (ref 5.0–7.5)
Protein, UA: NEGATIVE
Specific Gravity, UA: 1.025 (ref 1.005–1.030)
UUROB: 0.2 mg/dL (ref 0.2–1.0)

## 2017-01-09 LAB — MICROSCOPIC EXAMINATION

## 2017-01-09 MED ORDER — LIDOCAINE HCL 2 % EX GEL
1.0000 "application " | Freq: Once | CUTANEOUS | Status: AC
  Administered 2017-01-09: 1 via URETHRAL

## 2017-01-09 MED ORDER — BCG LIVE 50 MG IS SUSR
3.2400 mL | Freq: Once | INTRAVESICAL | Status: AC
  Administered 2017-01-09: 81 mg via INTRAVESICAL

## 2017-01-09 NOTE — Progress Notes (Signed)
BCG Bladder Instillation  BCG # 4  Due to Bladder Cancer patient is present today for a BCG treatment. Patient was cleaned and prepped in a sterile fashion with betadine and lidocaine 2% jelly was instilled into the urethra.  A 14FR catheter was inserted, urine return was noted 20ml, urine was yellow in color.  50ml of reconstituted BCG was instilled into the bladder. The catheter was then removed. Patient tolerated well, no complications were noted.  Preformed by: Shannon McGowan PA-C and Ramona Williams CMA  Follow up/ Additional notes: One week   

## 2017-01-09 NOTE — Progress Notes (Signed)
01/09/2017 9:23 PM   Casey Adkins 1934-01-31 [id number]  Referring provider: Rusty Aus, MD [facility] [place], [place] [address]  Chief Complaint  Patient presents with  . Bladder Cancer    BCG  # 71    HPI: 83 year old male with a history of bladder cancer who presents for # 4/6 BCG treatment.  Bladder cancer Patient is transferring his care from Dr. Jacqlyn Larsen due to transportation issues.     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  Most recent recurrence of CIS diagnosed on office biopsy on 09/24/2016. He underwent follow-up fulguration of this lesion in the office on 11/20/2016 with mitomycin.    He has since started BCG and has had 2 instillations thus far.  This course was started on 12/04/2016 and a second dose was administered on 12/18/2016.  His subsequent dose was held in the setting of urinary frequency, burning with urination, temp to 99, and malaise.  He was treated for urinary tract infection and further treatments were deferred.  He is currently on Cipro started on 12/30/2016.  Despite being in the middle of a course of BCG, he is interested in transferring his care to [facility]. He reports that he is frustrated with traffic and driving on the highways and no longer willing to drive to [place].  No recent upper tract imaging.  History of kidney stones ESWL x 1 several years ago.   No recent flank pain or stone episodes.    BPH Most recent PSA 1.08 on 2015.   Managed on Flomax/finasteride  Symptoms primarily urgency/frequency.  Today, he states he is having urinary frequency, urgency and nocturia.  He held his last instillation for one hour and one half.  He denied a new cough. He denied fevers, chills, nausea or vomiting. His UA today was positive for 6-10 WBCs and 3-10 rbc's per high-power field.  PMH: Past Medical  History:  Diagnosis Date  . Allergic rhinitis   . Cancer [facility])    Bladder Cancer  . Cancer [facility])    Prostate Cancer  . Diabetes mellitus without complication ([place])   . Diverticulosis   . Dysphagia   . GERD (gastroesophageal reflux disease)   . Hypertension   . Stroke [facility])     Surgical History: Past Surgical History:  Procedure Laterality Date  . ESOPHAGOGASTRODUODENOSCOPY N/A 04/21/2015   Procedure: ESOPHAGOGASTRODUODENOSCOPY (EGD);  Surgeon: Hulen Luster, MD;  Location: [facility] ENDOSCOPY;  Service: Gastroenterology;  Laterality: N/A;  . SINUS SURGERY WITH INSTATRAK  2008  . STAPEDES SURGERY    . TONSILLECTOMY      Home Medications:  Allergies as of 01/09/2017      Reactions   Prilosec [omeprazole]    GI sxs   Sulfur Swelling   In throat and tongue       Medication List       Accurate as of 01/09/17 11:59 PM. Always use your most recent med list.          acetaminophen 500 MG tablet Commonly known as:  TYLENOL Take 500 mg by mouth every 6 (six) hours as needed for moderate pain.   Calcium-D 600-400 MG-UNIT Tabs Take 1 tablet by mouth daily.   ciprofloxacin 500 MG tablet Commonly known as:  CIPRO Take 500 mg by mouth 2 (two) times daily.   finasteride 5 MG tablet Commonly known as:  PROSCAR Take 1 tablet by  mouth daily.   fluticasone 50 MCG/ACT nasal spray Commonly known as:  FLONASE Place 2 sprays into both nostrils daily.   glimepiride 4 MG tablet Commonly known as:  AMARYL Take 1 tablet by mouth daily before breakfast.   lisinopril 10 MG tablet Commonly known as:  PRINIVIL,ZESTRIL Take 10 mg by mouth 2 (two) times daily.   magnesium gluconate 500 MG tablet Commonly known as:  MAGONATE Take 500 mg by mouth daily.   metFORMIN 500 MG tablet Commonly known as:  GLUCOPHAGE Take 1,000 mg by mouth 2 (two) times daily.   PRESERVISION AREDS PO Take 1 tablet by mouth 2 (two) times daily.   propranolol 20 MG tablet Commonly known as:  INDERAL Take 1  tablet by mouth 2 (two) times daily.   tamsulosin 0.4 MG Caps capsule Commonly known as:  FLOMAX Take 1 capsule by mouth daily.   VITAMIN B 12 PO Take by mouth.       Allergies:  Allergies  Allergen Reactions  . Prilosec [Omeprazole]     GI sxs  . Sulfur Swelling    In throat and tongue     Family History: Family History  Problem Relation Age of Onset  . Prostate cancer Neg Hx   . Bladder Cancer Neg Hx   . Kidney cancer Neg Hx     Social History:  reports that he has quit smoking. He has never used smokeless tobacco. He reports that he does not drink alcohol or use drugs.  ROS: UROLOGY Frequent Urination?: Yes Hard to postpone urination?: Yes Burning/pain with urination?: No Get up at night to urinate?: Yes Leakage of urine?: No Urine stream starts and stops?: No Trouble starting stream?: No Do you have to strain to urinate?: No Blood in urine?: No Urinary tract infection?: No Sexually transmitted disease?: No Injury to kidneys or bladder?: No Painful intercourse?: No Weak stream?: No Erection problems?: Yes Penile pain?: No  Gastrointestinal Nausea?: No Vomiting?: No Indigestion/heartburn?: No Diarrhea?: No Constipation?: No  Constitutional Fever: No Night sweats?: No Weight loss?: No Fatigue?: No  Skin Skin rash/lesions?: No Itching?: No  Eyes Blurred vision?: No Double vision?: No  Ears/Nose/Throat Sore throat?: No Sinus problems?: Yes  Hematologic/Lymphatic Swollen glands?: No Easy bruising?: No  Cardiovascular Leg swelling?: No Chest pain?: No  Respiratory Cough?: No Shortness of breath?: No  Endocrine Excessive thirst?: No  Musculoskeletal Back pain?: No Joint pain?: No  Neurological Headaches?: No Dizziness?: No  Psychologic Depression?: No Anxiety?: No  Physical Exam: BP (!) 144/78   Pulse 65   Ht 5\' 10"  (1.778 m)   Wt 220 lb 8 oz (100 kg)   BMI 31.64 kg/m   Constitutional:  Alert and oriented, No  acute distress.  Accompanied by wife today. Mildly agitated. HEENT: [place] AT, moist mucus membranes.  Trachea midline, no masses.  Severely hard of hearing. Cardiovascular: No clubbing, cyanosis, or edema. Respiratory: Normal respiratory effort, no increased work of breathing. GI: Abdomen is soft, nontender, nondistended, no abdominal masses GU: No CVA tenderness. Skin: No rashes, bruises or suspicious lesions. Neurologic: Grossly intact, no focal deficits, moving all 4 extremities. Psychiatric: Normal mood and affect.  Laboratory Data: Lab Results  Component Value Date   WBC 7.4 05/10/2016   HGB 14.3 05/10/2016   HCT 42.5 05/10/2016   MCV 92.2 05/10/2016   PLT 150 05/10/2016    Lab Results  Component Value Date   CREATININE 0.89 05/10/2016   Urinalysis Results for orders placed or performed in  visit on 01/09/17  Microscopic Examination  Result Value Ref Range   WBC, UA 6-10 (A) 0 - 5 /hpf   RBC, UA 3-10 (A) 0 - 2 /hpf   Epithelial Cells (non renal) >10 (A) 0 - 10 /hpf   Bacteria, UA Few None seen/Few  Urinalysis, Complete  Result Value Ref Range   Specific Gravity, UA 1.025 1.005 - 1.030   pH, UA 5.0 5.0 - 7.5   Color, UA Yellow Yellow   Appearance Ur Hazy (A) Clear   Leukocytes, UA Trace (A) Negative   Protein, UA Negative Negative/Trace   Glucose, UA Trace (A) Negative   Ketones, UA Negative Negative   RBC, UA Trace (A) Negative   Bilirubin, UA Negative Negative   Urobilinogen, Ur 0.2 0.2 - 1.0 mg/dL   Nitrite, UA Negative Negative   Microscopic Examination See below:     Pertinent Imaging: No recent imaging  Assessment & Plan:    1. CIS (carcinoma in situ of bladder) Pathology was reviewed today along with the patient's history. Ideally, the patient would complete his current course of BCG with his current urologist but the patient adamantly refuses. In order to not further delay his induction course, I agreed to give him his 4 remaining treatments. He be  placed on maintenance thereafter in this course was discussed. BCG risk reviewed in detail, info given. After his treatments, we will follow-up with office cystoscopy, cytology and upper tract imaging. - Urinalysis, Complete - CULTURE, URINE COMPREHENSIVE  - Reviewed BCG treatment course, possible side effects including BCG sepsis, bladder irritation, worsening of her urinary symptoms  - # 4 of 6 BCG installed today  - Patient was instructed to pour bleach down his toilet for the next 6 hours  -  Instructed to call the office if she should experience fevers greater than 102, chills/rigors, onset of a new cough, night sweats or further bladder spasms or inability to urinate   - RTC in one week for # 5 BCG  - Surveillance protocol also discussed today including cystoscopy every 3 months for at least 2 years and then spread out thereafter  - bcg vaccine injection 81 mg; Instill 3.24 mLs (81 mg total) into the bladder once.  - lidocaine (XYLOCAINE) 2 % jelly 1 application; Place 1 application into the urethra once.  2. History of kidney stones Currently asymptomatic, no recent stone episodes We'll continue to follow  3. Benign prostatic hyperplasia with urinary frequency Continue Flomax/finasteride    Return in about 1 week (around 01/16/2017) for # 5 BCG.  Zara Council, [facility] [address], [place], [place] [address] [phone]

## 2017-01-16 ENCOUNTER — Ambulatory Visit (INDEPENDENT_AMBULATORY_CARE_PROVIDER_SITE_OTHER): Payer: Medicare Other | Admitting: Urology

## 2017-01-16 ENCOUNTER — Encounter: Payer: Self-pay | Admitting: Urology

## 2017-01-16 VITALS — BP 148/74 | HR 64 | Ht 70.0 in | Wt 219.7 lb

## 2017-01-16 DIAGNOSIS — Z87442 Personal history of urinary calculi: Secondary | ICD-10-CM | POA: Diagnosis not present

## 2017-01-16 DIAGNOSIS — N401 Enlarged prostate with lower urinary tract symptoms: Secondary | ICD-10-CM | POA: Diagnosis not present

## 2017-01-16 DIAGNOSIS — D0001 Carcinoma in situ of labial mucosa and vermilion border: Secondary | ICD-10-CM

## 2017-01-16 DIAGNOSIS — R350 Frequency of micturition: Secondary | ICD-10-CM | POA: Diagnosis not present

## 2017-01-16 LAB — URINALYSIS, COMPLETE
Bilirubin, UA: NEGATIVE
KETONES UA: NEGATIVE
LEUKOCYTES UA: NEGATIVE
Nitrite, UA: NEGATIVE
Protein, UA: NEGATIVE
SPEC GRAV UA: 1.025 (ref 1.005–1.030)
Urobilinogen, Ur: 0.2 mg/dL (ref 0.2–1.0)
pH, UA: 5 (ref 5.0–7.5)

## 2017-01-16 LAB — MICROSCOPIC EXAMINATION
Bacteria, UA: NONE SEEN
Epithelial Cells (non renal): >10 /hpf — AB (ref 0–10)

## 2017-01-16 MED ORDER — BCG LIVE 50 MG IS SUSR
3.2400 mL | Freq: Once | INTRAVESICAL | Status: AC
  Administered 2017-01-16: 81 mg via INTRAVESICAL

## 2017-01-16 MED ORDER — LIDOCAINE HCL 2 % EX GEL
1.0000 "application " | Freq: Once | CUTANEOUS | Status: AC
  Administered 2017-01-16: 1 via URETHRAL

## 2017-01-16 NOTE — Addendum Note (Signed)
Addended by: Orlene Erm on: 01/16/2017 11:20 AM   Modules accepted: Orders

## 2017-01-16 NOTE — Progress Notes (Signed)
01/16/2017 10:36 AM   Casey Adkins 31-Aug-1934 [id number]  Referring provider: Rusty Aus, MD [place] [facility] [place] [id number]  Chief Complaint  Patient presents with  . Bladder Cancer    BCG # 5     HPI: 83 year old male with a history of bladder cancer who presents for # 5/6 BCG treatment.  Bladder cancer Patient is transferring his care from Dr. Jacqlyn Larsen due to transportation issues.     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  Most recent recurrence of CIS diagnosed on office biopsy on 09/24/2016. He underwent follow-up fulguration of this lesion in the office on 11/20/2016 with mitomycin.    He has since started BCG and has had 2 instillations thus far.  This course was started on 12/04/2016 and a second dose was administered on 12/18/2016.  His subsequent dose was held in the setting of urinary frequency, burning with urination, temp to 99, and malaise.  He was treated for urinary tract infection and further treatments were deferred.  He is currently on Cipro started on 12/30/2016.  Despite being in the middle of a course of BCG, he is interested in transferring his care to [facility]. He reports that he is frustrated with traffic and driving on the highways and no longer willing to drive to [place].  No recent upper tract imaging.  History of kidney stones ESWL x 1 several years ago.   No recent flank pain or stone episodes.    BPH Most recent PSA 1.08 on 2015.   Managed on Flomax/finasteride  Symptoms primarily urgency/frequency.  Today, he states he is having urinary frequency, urgency and nocturia.  He held his last instillation for one hour and one half.  He denied a new cough. He denied fevers, chills, nausea or vomiting. His UA today was positive for 0-5 WBCs and 3-10 rbc's per high-power field.  PMH: Past Medical  History:  Diagnosis Date  . Allergic rhinitis   . Cancer [facility])    Bladder Cancer  . Cancer [facility])    Prostate Cancer  . Diabetes mellitus without complication ([place])   . Diverticulosis   . Dysphagia   . GERD (gastroesophageal reflux disease)   . Hypertension   . Stroke [facility])     Surgical History: Past Surgical History:  Procedure Laterality Date  . ESOPHAGOGASTRODUODENOSCOPY N/A 04/21/2015   Procedure: ESOPHAGOGASTRODUODENOSCOPY (EGD);  Surgeon: Hulen Luster, MD;  Location: [facility] ENDOSCOPY;  Service: Gastroenterology;  Laterality: N/A;  . SINUS SURGERY WITH INSTATRAK  2008  . STAPEDES SURGERY    . TONSILLECTOMY      Home Medications:  Allergies as of 01/16/2017      Reactions   Prilosec [omeprazole]    GI sxs   Sulfur Swelling   In throat and tongue       Medication List       Accurate as of 01/16/17 10:36 AM. Always use your most recent med list.          acetaminophen 500 MG tablet Commonly known as:  TYLENOL Take 500 mg by mouth every 6 (six) hours as needed for moderate pain.   Calcium Carbonate-Vitamin D3 600-400 MG-UNIT Tabs Take by mouth.   Calcium-D 600-400 MG-UNIT Tabs Take 1 tablet by mouth daily.   ciprofloxacin 500 MG tablet Commonly known as:  CIPRO Take 500 mg by mouth 2 (two) times daily.   fluticasone 50  MCG/ACT nasal spray Commonly known as:  FLONASE Place 2 sprays into both nostrils daily.   glimepiride 4 MG tablet Commonly known as:  AMARYL Take 1 tablet by mouth daily before breakfast.   lisinopril 10 MG tablet Commonly known as:  PRINIVIL,ZESTRIL Take 10 mg by mouth 2 (two) times daily.   magnesium gluconate 500 MG tablet Commonly known as:  MAGONATE Take 500 mg by mouth daily.   Magnesium Oxide 500 MG Tabs Take by mouth.   metFORMIN 500 MG tablet Commonly known as:  GLUCOPHAGE Take 1,000 mg by mouth 2 (two) times daily.   PRESERVISION AREDS PO Take 1 tablet by mouth 2 (two) times daily.   PRESERVISION/LUTEIN PO Take by  mouth.   propranolol 20 MG tablet Commonly known as:  INDERAL Take 1 tablet by mouth 2 (two) times daily.   tamsulosin 0.4 MG Caps capsule Commonly known as:  FLOMAX Take 1 capsule by mouth daily.   VITAMIN B 12 PO Take by mouth.       Allergies:  Allergies  Allergen Reactions  . Prilosec [Omeprazole]     GI sxs  . Sulfur Swelling    In throat and tongue     Family History: Family History  Problem Relation Age of Onset  . Prostate cancer Neg Hx   . Bladder Cancer Neg Hx   . Kidney cancer Neg Hx     Social History:  reports that he has quit smoking. He has never used smokeless tobacco. He reports that he does not drink alcohol or use drugs.  ROS: UROLOGY Frequent Urination?: Yes Hard to postpone urination?: Yes Burning/pain with urination?: No Get up at night to urinate?: Yes Leakage of urine?: No Urine stream starts and stops?: No Trouble starting stream?: No Do you have to strain to urinate?: No Blood in urine?: No Urinary tract infection?: No Sexually transmitted disease?: No Injury to kidneys or bladder?: No Painful intercourse?: No Weak stream?: No Erection problems?: Yes Penile pain?: No  Gastrointestinal Nausea?: No Vomiting?: No Indigestion/heartburn?: No Diarrhea?: No Constipation?: No  Constitutional Fever: No Night sweats?: No Weight loss?: No Fatigue?: No  Skin Skin rash/lesions?: No Itching?: No  Eyes Blurred vision?: No Double vision?: No  Ears/Nose/Throat Sore throat?: No Sinus problems?: Yes  Hematologic/Lymphatic Swollen glands?: No Easy bruising?: No  Cardiovascular Leg swelling?: No Chest pain?: No  Respiratory Cough?: No Shortness of breath?: No  Endocrine Excessive thirst?: No  Musculoskeletal Back pain?: No Joint pain?: No  Neurological Headaches?: No Dizziness?: No  Psychologic Depression?: No Anxiety?: No  Physical Exam: BP (!) 148/74   Pulse 64   Ht 5\' 10"  (1.778 m)   Wt 219 lb 11.2  oz (99.7 kg)   BMI 31.52 kg/m   Constitutional:  Alert and oriented, No acute distress.  Accompanied by wife today. Mildly agitated. HEENT: [place] Ad[place] AT, moist mucus membranes.  Trachea midline, no masses.  Severely hard of hearing. Cardiovascular: No clubbing, cyanosis, or edema. Respiratory: Normal respiratory effort, no increased work of breathing. GI: Abdomen is soft, nontender, nondistended, no abdominal masses GU: No CVA tenderness. Skin: No rashes, bruises or suspicious lesions. Neurologic: Grossly intact, no focal deficits, moving all 4 extremities. Psychiatric: Normal mood and affect.  Laboratory Data: Lab Results  Component Value Date   WBC 7.4 05/10/2016   HGB 14.3 05/10/2016   HCT 42.5 05/10/2016   MCV 92.2 05/10/2016   PLT 150 05/10/2016    Lab Results  Component Value Date   CREATININE 0.89  05/10/2016   Urinalysis 0-5 WBC.  3-10 RBC.  See EPIC.    Pertinent Imaging:  No recent imaging  Assessment & Plan:    1. CIS (carcinoma in situ of bladder) Pathology was reviewed today along with the patient's history. Ideally, the patient would complete his current course of BCG with his current urologist but the patient adamantly refuses. In order to not further delay his induction course, I agreed to give him his 4 remaining treatments. He be placed on maintenance thereafter in this course was discussed. BCG risk reviewed in detail, info given. After his treatments, we will follow-up with office cystoscopy, cytology and upper tract imaging. - Urinalysis, Complete - CULTURE, URINE COMPREHENSIVE  - Reviewed BCG treatment course, possible side effects including BCG sepsis, bladder irritation, worsening of her urinary symptoms  - # 5 of 6 BCG installed today  - Patient was instructed to pour bleach down his toilet for the next 6 hours  -  Instructed to call the office if she should experience fevers greater than 102, chills/rigors, onset of a new cough, night sweats or further  bladder spasms or inability to urinate   - RTC in one week for # 6 BCG  - Surveillance protocol also discussed today including cystoscopy every 3 months for at least 2 years and then spread out thereafter  - bcg vaccine injection 81 mg; Instill 3.24 mLs (81 mg total) into the bladder once.  - lidocaine (XYLOCAINE) 2 % jelly 1 application; Place 1 application into the urethra once.  2. History of kidney stones Currently asymptomatic, no recent stone episodes We'll continue to follow  3. Benign prostatic hyperplasia with urinary frequency Continue Flomax/finasteride  Return in about 1 week (around 01/23/2017) for # 6 BCG.  Zara Council, [facility] [address], [place], [place] [address] [phone]

## 2017-01-16 NOTE — Progress Notes (Signed)
BCG Bladder Instillation  BCG # 5  Due to Bladder Cancer patient is present today for a BCG treatment. Patient was cleaned and prepped in a sterile fashion with betadine and lidocaine 2% jelly was instilled into the urethra.  A 14FR catheter was inserted, urine return was noted 41ml, urine was yellow in color.  86ml of reconstituted BCG was instilled into the bladder. The catheter was then removed. Patient tolerated well, no complications were noted.  Preformed by: Zara Council PA-C and Lyndee Hensen CMA  Follow up/ Additional notes: One week

## 2017-01-22 ENCOUNTER — Encounter: Payer: Self-pay | Admitting: Urology

## 2017-01-22 ENCOUNTER — Ambulatory Visit: Payer: Medicare Other | Admitting: Urology

## 2017-01-22 VITALS — BP 147/75 | HR 73 | Ht 70.0 in | Wt 219.5 lb

## 2017-01-22 DIAGNOSIS — Z87442 Personal history of urinary calculi: Secondary | ICD-10-CM | POA: Diagnosis not present

## 2017-01-22 DIAGNOSIS — R350 Frequency of micturition: Secondary | ICD-10-CM | POA: Diagnosis not present

## 2017-01-22 DIAGNOSIS — N401 Enlarged prostate with lower urinary tract symptoms: Secondary | ICD-10-CM | POA: Diagnosis not present

## 2017-01-22 DIAGNOSIS — C679 Malignant neoplasm of bladder, unspecified: Secondary | ICD-10-CM | POA: Diagnosis not present

## 2017-01-22 LAB — URINALYSIS, COMPLETE
BILIRUBIN UA: NEGATIVE
Glucose, UA: NEGATIVE
Ketones, UA: NEGATIVE
LEUKOCYTES UA: NEGATIVE
NITRITE UA: NEGATIVE
PH UA: 5 (ref 5.0–7.5)
Protein, UA: NEGATIVE
Specific Gravity, UA: >1.03 — ABNORMAL HIGH (ref 1.005–1.030)
Urobilinogen, Ur: 0.2 mg/dL (ref 0.2–1.0)

## 2017-01-22 LAB — MICROSCOPIC EXAMINATION: BACTERIA UA: NONE SEEN

## 2017-01-22 MED ORDER — TAMSULOSIN HCL 0.4 MG PO CAPS: 0.4000 mg | ORAL_CAPSULE | Freq: Every day | ORAL | 3 refills | Status: DC

## 2017-01-22 MED ORDER — BCG LIVE 50 MG IS SUSR
3.2400 mL | Freq: Once | INTRAVESICAL | Status: AC
  Administered 2017-01-22: 81 mg via INTRAVESICAL

## 2017-01-22 MED ORDER — LIDOCAINE HCL 2 % EX GEL
1.0000 "application " | Freq: Once | CUTANEOUS | Status: AC
  Administered 2017-01-22: 1 via URETHRAL

## 2017-01-22 MED ORDER — FINASTERIDE 5 MG PO TABS: 5.0000 mg | ORAL_TABLET | Freq: Every day | ORAL | 3 refills | Status: DC

## 2017-01-22 NOTE — Progress Notes (Signed)
BCG Bladder Instillation  BCG # 6  Due to Bladder Cancer patient is present today for a BCG treatment. Patient was cleaned and prepped in a sterile fashion with betadine and lidocaine 2% jelly was instilled into the urethra.  A 14FR catheter was inserted, urine return was noted 24ml, urine was yellow in color.  8ml of reconstituted BCG was instilled into the bladder. The catheter was then removed. Patient tolerated well, no complications were noted.  Preformed [id number] McGowan PA-C and Ramona Williams CMA  Follow up/ Additional notes: 3 months for Cysto

## 2017-01-22 NOTE — Progress Notes (Signed)
01/22/2017 2:33 PM   Cherre Blanc May 19, 1934 [id number]  Referring provider: Rusty Aus, MD [facility] [place], [place] [address]  Chief Complaint  Patient presents with  . Bladder Cancer    BCG  6     HPI: 83 year old male with a history of bladder cancer who presents for # 6/6 BCG treatment.  Bladder cancer Patient is transferring his care from Dr. Jacqlyn Larsen due to transportation issues.     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  Most recent recurrence of CIS diagnosed on office biopsy on 09/24/2016. He underwent follow-up fulguration of this lesion in the office on 11/20/2016 with mitomycin.    He has since started BCG and has had 2 instillations thus far.  This course was started on 12/04/2016 and a second dose was administered on 12/18/2016.  His subsequent dose was held in the setting of urinary frequency, burning with urination, temp to 99, and malaise.  He was treated for urinary tract infection and further treatments were deferred.  He is currently on Cipro started on 12/30/2016.  Despite being in the middle of a course of BCG, he is interested in transferring his care to [facility]. He reports that he is frustrated with traffic and driving on the highways and no longer willing to drive to [place].  No recent upper tract imaging.  History of kidney stones ESWL x 1 several years ago.   No recent flank pain or stone episodes.    BPH Most recent PSA 1.08 on 2015.   Managed on Flomax/finasteride  Symptoms primarily urgency/frequency.  Today, he states he is having urinary frequency, urgency and nocturia.  He held his last instillation for one hour and one half.  He denied a new cough. He denied fevers, chills, nausea or vomiting. His UA today was positive for 0-5 WBCs and 11/30 rbc's per high-power field.  PMH: Past Medical  History:  Diagnosis Date  . Allergic rhinitis   . Cancer [facility])    Bladder Cancer  . Cancer [facility])    Prostate Cancer  . Diabetes mellitus without complication ([place])   . Diverticulosis   . Dysphagia   . GERD (gastroesophageal reflux disease)   . Hypertension   . Stroke [facility])     Surgical History: Past Surgical History:  Procedure Laterality Date  . ESOPHAGOGASTRODUODENOSCOPY N/A 04/21/2015   Procedure: ESOPHAGOGASTRODUODENOSCOPY (EGD);  Surgeon: Hulen Luster, MD;  Location: [facility] ENDOSCOPY;  Service: Gastroenterology;  Laterality: N/A;  . SINUS SURGERY WITH INSTATRAK  2008  . STAPEDES SURGERY    . TONSILLECTOMY      Home Medications:  Allergies as of 01/22/2017      Reactions   Prilosec [omeprazole]    GI sxs   Sulfur Swelling   In throat and tongue       Medication List       Accurate as of 01/22/17  2:33 PM. Always use your most recent med list.          acetaminophen 500 MG tablet Commonly known as:  TYLENOL Take 500 mg by mouth every 6 (six) hours as needed for moderate pain.   Calcium Carbonate-Vitamin D3 600-400 MG-UNIT Tabs Take by mouth.   Calcium-D 600-400 MG-UNIT Tabs Take 1 tablet by mouth daily.   ciprofloxacin 500 MG tablet Commonly known as:  CIPRO Take 500 mg by mouth 2 (two) times daily.   finasteride  5 MG tablet Commonly known as:  PROSCAR Take 1 tablet (5 mg total) by mouth daily.   fluticasone 50 MCG/ACT nasal spray Commonly known as:  FLONASE Place 2 sprays into both nostrils daily.   glimepiride 4 MG tablet Commonly known as:  AMARYL Take 1 tablet by mouth daily before breakfast.   lisinopril 10 MG tablet Commonly known as:  PRINIVIL,ZESTRIL Take 10 mg by mouth 2 (two) times daily.   magnesium gluconate 500 MG tablet Commonly known as:  MAGONATE Take 500 mg by mouth daily.   Magnesium Oxide 500 MG Tabs Take by mouth.   metFORMIN 500 MG tablet Commonly known as:  GLUCOPHAGE Take 1,000 mg by mouth 2 (two) times daily.     PRESERVISION AREDS PO Take 1 tablet by mouth 2 (two) times daily.   PRESERVISION/LUTEIN PO Take by mouth.   propranolol 20 MG tablet Commonly known as:  INDERAL Take 1 tablet by mouth 2 (two) times daily.   tamsulosin 0.4 MG Caps capsule Commonly known as:  FLOMAX Take 1 capsule (0.4 mg total) by mouth daily.   VITAMIN B 12 PO Take by mouth.       Allergies:  Allergies  Allergen Reactions  . Prilosec [Omeprazole]     GI sxs  . Sulfur Swelling    In throat and tongue     Family History: Family History  Problem Relation Age of Onset  . Prostate cancer Neg Hx   . Bladder Cancer Neg Hx   . Kidney cancer Neg Hx     Social History:  reports that he has quit smoking. He has never used smokeless tobacco. He reports that he does not drink alcohol or use drugs.  ROS: UROLOGY Frequent Urination?: Yes Hard to postpone urination?: Yes Burning/pain with urination?: No Get up at night to urinate?: Yes Leakage of urine?: No Urine stream starts and stops?: No Trouble starting stream?: No Do you have to strain to urinate?: No Blood in urine?: No Urinary tract infection?: No Sexually transmitted disease?: No Injury to kidneys or bladder?: No Painful intercourse?: No Weak stream?: No Erection problems?: Yes Penile pain?: No  Gastrointestinal Nausea?: No Vomiting?: No Indigestion/heartburn?: No Diarrhea?: No Constipation?: No  Constitutional Fever: No Night sweats?: No Weight loss?: No Fatigue?: No  Skin Skin rash/lesions?: No Itching?: No  Eyes Blurred vision?: No Double vision?: No  Ears/Nose/Throat Sore throat?: No Sinus problems?: Yes  Hematologic/Lymphatic Swollen glands?: No Easy bruising?: No  Cardiovascular Leg swelling?: No Chest pain?: No  Respiratory Cough?: No Shortness of breath?: No  Endocrine Excessive thirst?: No  Musculoskeletal Back pain?: No Joint pain?: No  Neurological Headaches?: No Dizziness?:  No  Psychologic Depression?: No Anxiety?: No  Physical Exam: BP (!) 147/75   Pulse 73   Ht 5\' 10"  (1.778 m)   Wt 219 lb 8 oz (99.6 kg)   BMI 31.49 kg/m   Constitutional:  Alert and oriented, No acute distress.  Accompanied by wife today. Mildly agitated. HEENT: [place] AT, moist mucus membranes.  Trachea midline, no masses.  Severely hard of hearing. Cardiovascular: No clubbing, cyanosis, or edema. Respiratory: Normal respiratory effort, no increased work of breathing. GI: Abdomen is soft, nontender, nondistended, no abdominal masses GU: No CVA tenderness. Skin: No rashes, bruises or suspicious lesions. Neurologic: Grossly intact, no focal deficits, moving all 4 extremities. Psychiatric: Normal mood and affect.  Laboratory Data: Lab Results  Component Value Date   WBC 7.4 05/10/2016   HGB 14.3 05/10/2016   HCT  42.5 05/10/2016   MCV 92.2 05/10/2016   PLT 150 05/10/2016    Lab Results  Component Value Date   CREATININE 0.89 05/10/2016   Urinalysis 0-5 WBC.  11-30 RBC.  See EPIC.    Pertinent Imaging:  No recent imaging  Assessment & Plan:    1. CIS (carcinoma in situ of bladder) Pathology was reviewed today along with the patient's history. Ideally, the patient would complete his current course of BCG with his current urologist but the patient adamantly refuses. In order to not further delay his induction course, I agreed to give him his 4 remaining treatments. He be placed on maintenance thereafter in this course was discussed. BCG risk reviewed in detail, info given. After his treatments, we will follow-up with office cystoscopy, cytology and upper tract imaging. - Urinalysis, Complete - CULTURE, URINE COMPREHENSIVE  - Reviewed BCG treatment course, possible side effects including BCG sepsis, bladder irritation, worsening of her urinary symptoms  - # 6 of 6 BCG installed today  - Patient was instructed to pour bleach down his toilet for the next 6 hours  -   Instructed to call the office if she should experience fevers greater than 102, chills/rigors, onset of a new cough, night sweats or further bladder spasms or inability to urinate  - RTC on 04/23/2017 for surveillance cystoscopy  - Surveillance protocol also discussed today including cystoscopy every 3 months for at least 2 years and then spread out thereafter  - bcg vaccine injection 81 mg; Instill 3.24 mLs (81 mg total) into the bladder once.  - lidocaine (XYLOCAINE) 2 % jelly 1 application; Place 1 application into the urethra once.  2. History of kidney stones Currently asymptomatic, no recent stone episodes We'll continue to follow  3. Benign prostatic hyperplasia with urinary frequency Continue Flomax/finasteride; refills given today  Return for RTC on 04/23/2017 for surveillance cystoscopy.  Zara Council, [facility] [address] [place], [place] [address] [phone]

## 2017-03-18 ENCOUNTER — Telehealth: Payer: Self-pay | Admitting: Urology

## 2017-03-18 NOTE — Telephone Encounter (Signed)
Pt called and wanted to know why he was charged a different amount for BCG on 2/22 than 2/28.  Can you please research and give pt a call at [phone].

## 2017-03-25 ENCOUNTER — Other Ambulatory Visit: Payer: Self-pay | Admitting: Urology

## 2017-04-23 ENCOUNTER — Ambulatory Visit (INDEPENDENT_AMBULATORY_CARE_PROVIDER_SITE_OTHER): Payer: Medicare Other | Admitting: Urology

## 2017-04-23 ENCOUNTER — Encounter: Payer: Self-pay | Admitting: Urology

## 2017-04-23 VITALS — BP 133/79 | HR 78 | Ht 70.0 in | Wt 220.0 lb

## 2017-04-23 DIAGNOSIS — Z8551 Personal history of malignant neoplasm of bladder: Secondary | ICD-10-CM

## 2017-04-23 LAB — URINALYSIS, COMPLETE
Bilirubin, UA: NEGATIVE
GLUCOSE, UA: NEGATIVE
Ketones, UA: NEGATIVE
Leukocytes, UA: NEGATIVE
Nitrite, UA: NEGATIVE
PH UA: 5 (ref 5.0–7.5)
PROTEIN UA: NEGATIVE
Specific Gravity, UA: 1.025 (ref 1.005–1.030)
Urobilinogen, Ur: 0.2 mg/dL (ref 0.2–1.0)

## 2017-04-23 LAB — MICROSCOPIC EXAMINATION

## 2017-04-23 MED ORDER — CIPROFLOXACIN HCL 500 MG PO TABS
500.0000 mg | ORAL_TABLET | Freq: Once | ORAL | Status: AC
  Administered 2017-04-23: 500 mg via ORAL

## 2017-04-23 MED ORDER — LIDOCAINE HCL 2 % EX GEL
1.0000 "application " | Freq: Once | CUTANEOUS | Status: AC
  Administered 2017-04-23: 1 via URETHRAL

## 2017-04-23 NOTE — Progress Notes (Signed)
04/23/2017 5:23 PM   Casey Adkins 1934-02-26 [id number]  Referring provider: Rusty Aus, MD [facility] [place], [place] [address]  Chief Complaint  Patient presents with  . Cysto    HPI: 83 year old male with a history of bladder cancer s/p BCG x 6 who returns today for surveillance cystoscopy.  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  Most recent recurrence of CIS diagnosed on office biopsy on 09/24/2016. He underwent follow-up fulguration of this lesion in the office on 11/20/2016 with mitomycin.    He has since started BCG and has had 2 instillations with Dr. Jacqlyn Larsen. This course was started on 12/04/2016 and a second dose was administered on 12/18/2016.  His subsequent dose was held in the setting of urinary frequency, burning with urination, temp to 99, and malaise.  He was treated for urinary tract infection and further treatments were deferred.  He then transferred his care to [facility] and complete the course which finished on 01/22/17.  The remained of the course was well tolerated.  No recent upper tract imaging.  History of kidney stones ESWL x 1 several years ago.   No recent flank pain or stone episodes.    BPH Most recent PSA 1.08 on 2015.   Managed on Flomax/finasteride  Symptoms primarily urgency/frequency, stable   PMH: Past Medical History:  Diagnosis Date  . Allergic rhinitis   . Cancer [facility])    Bladder Cancer  . Cancer [facility])    Prostate Cancer  . Diabetes mellitus without complication ([place])   . Diverticulosis   . Dysphagia   . GERD (gastroesophageal reflux disease)   . Hypertension   . Stroke [facility])     Surgical History: Past Surgical History:  Procedure Laterality Date  . ESOPHAGOGASTRODUODENOSCOPY N/A 04/21/2015   Procedure: ESOPHAGOGASTRODUODENOSCOPY (EGD);  Surgeon: Hulen Luster, MD;  Location: [facility]  ENDOSCOPY;  Service: Gastroenterology;  Laterality: N/A;  . SINUS SURGERY WITH INSTATRAK  2008  . STAPEDES SURGERY    . TONSILLECTOMY      Home Medications:  Allergies as of 04/23/2017      Reactions   Sulfa Antibiotics Anaphylaxis   Tongue swelling.   Prilosec [omeprazole]    GI sxs   Sulfur Swelling   In throat and tongue       Medication List       Accurate as of 04/23/17  5:23 PM. Always use your most recent med list.          acetaminophen 500 MG tablet Commonly known as:  TYLENOL Take 500 mg by mouth every 6 (six) hours as needed for moderate pain.   Calcium Carbonate-Vitamin D3 600-400 MG-UNIT Tabs Take by mouth.   Calcium-D 600-400 MG-UNIT Tabs Take 1 tablet by mouth daily.   finasteride 5 MG tablet Commonly known as:  PROSCAR TAKE 1 TABLET BY MOUTH DAILY.   fluticasone 50 MCG/ACT nasal spray Commonly known as:  FLONASE Place 2 sprays into both nostrils daily.   glimepiride 4 MG tablet Commonly known as:  AMARYL Take 1 tablet by mouth daily before breakfast.   lisinopril 10 MG tablet Commonly known as:  PRINIVIL,ZESTRIL Take 10 mg by mouth 2 (two) times daily.   magnesium gluconate 500 MG tablet Commonly known as:  MAGONATE Take 500 mg by mouth daily.   Magnesium Oxide 500 MG Tabs Take by mouth.   metFORMIN 500 MG tablet Commonly  known as:  GLUCOPHAGE Take 1,000 mg by mouth 2 (two) times daily.   PRESERVISION AREDS PO Take 1 tablet by mouth 2 (two) times daily.   PRESERVISION/LUTEIN PO Take by mouth.   primidone 50 MG tablet Commonly known as:  MYSOLINE Take 25 mg by mouth daily.   propranolol 20 MG tablet Commonly known as:  INDERAL Take 1 tablet by mouth 2 (two) times daily.   tamsulosin 0.4 MG Caps capsule Commonly known as:  FLOMAX TAKE 1 CAPSULE BY MOUTH DAILY.   VITAMIN B 12 PO Take by mouth.       Allergies:  Allergies  Allergen Reactions  . Sulfa Antibiotics Anaphylaxis    Tongue swelling.  . Prilosec  [Omeprazole]     GI sxs  . Sulfur Swelling    In throat and tongue     Family History: Family History  Problem Relation Age of Onset  . Prostate cancer Neg Hx   . Bladder Cancer Neg Hx   . Kidney cancer Neg Hx     Social History:  reports that he has quit smoking. He has never used smokeless tobacco. He reports that he does not drink alcohol or use drugs.   Physical Exam: BP 133/79   Pulse 78   Ht 5\' 10"  (1.778 m)   Wt 220 lb (99.8 kg)   BMI 31.57 kg/m   Constitutional:  Alert and oriented, No acute distress.   HEENT: [place] AT, moist mucus membranes.  Trachea midline, no masses.  Severely hard of hearing. Cardiovascular: No clubbing, cyanosis, or edema. Respiratory: Normal respiratory effort, no increased work of breathing. GI: Abdomen is soft, nontender, nondistended, no abdominal masses GU: Circumcised phallus with orthotopic meatus. Bilateral descended testicles. Skin: No rashes, bruises or suspicious lesions. Neurologic: Grossly intact, no focal deficits, moving all 4 extremities. Psychiatric: Normal mood and affect.  Laboratory Data: Lab Results  Component Value Date   WBC 7.4 05/10/2016   HGB 14.3 05/10/2016   HCT 42.5 05/10/2016   MCV 92.2 05/10/2016   PLT 150 05/10/2016    Lab Results  Component Value Date   CREATININE 0.89 05/10/2016   Urinalysis Results for orders placed or performed in visit on 04/23/17  Microscopic Examination  Result Value Ref Range   WBC, UA 0-5 0 - 5 /hpf   RBC, UA 0-2 0 - 2 /hpf   Epithelial Cells (non renal) 0-10 0 - 10 /hpf   Bacteria, UA Few (A) None seen/Few  Urinalysis, Complete  Result Value Ref Range   Specific Gravity, UA 1.025 1.005 - 1.030   pH, UA 5.0 5.0 - 7.5   Color, UA Yellow Yellow   Appearance Ur Clear Clear   Leukocytes, UA Negative Negative   Protein, UA Negative Negative/Trace   Glucose, UA Negative Negative   Ketones, UA Negative Negative   RBC, UA Trace (A) Negative   Bilirubin, UA Negative Negative    Urobilinogen, Ur 0.2 0.2 - 1.0 mg/dL   Nitrite, UA Negative Negative   Microscopic Examination See below:     Cystoscopy Procedure Note  Patient identification was confirmed, informed consent was obtained, and patient was prepped using Betadine solution.  Lidocaine jelly was administered per urethral meatus.    Preoperative abx where received prior to procedure.     Pre-Procedure: - Inspection reveals a normal caliber ureteral meatus.  Procedure: The flexible cystoscope was introduced without difficulty - No urethral strictures/lesions are present. - Enlarged prostate  - Normal bladder neck - Bilateral ureteral  orifices identified - Bladder mucosa  reveals no ulcers, tumors, or lesions.  Multiple previous stellate scars appreciated. - No bladder stones - No trabeculation  Retroflexion unremarkable.   Post-Procedure: - Patient tolerated the procedure well  Pertinent Imaging: No recent imaging  Assessment & Plan:    1. History of CIS (carcinoma in situ of bladder) Cystoscopy today unremarkable, reassuring Urine cytology sent, pending Consider repeat biopsy, bilateral retrograde pyelogram if cytology is suspicious or positive Recommend continuation of maintenance BCG on a schedule of 3 months, 6 months, 12 months, 18 months, 24 months, 30 months, and 36 months.   2. History of kidney stones Currently asymptomatic, no recent stone episodes We'll continue to follow  3. Benign prostatic hyperplasia with urinary frequency Continue Flomax/finasteride   Return in about 3 months (around 07/24/2017) for BCG x 3 starting next week, then cysto/ cytology in 3 months.  Hollice Espy, MD  [facility] [address] [place], [place] [address] [phone]

## 2017-04-28 ENCOUNTER — Other Ambulatory Visit: Payer: Self-pay | Admitting: Urology

## 2017-04-28 NOTE — Progress Notes (Signed)
04/30/2017 11:52 AM   Casey Adkins [birth year] [id number]  Referring provider: Rusty Aus, MD [facility] [place], [place] [address]  Chief Complaint  Patient presents with  . Bladder Cancer    BCG 1  of 3      HPI: 83 year old male with a history of bladder cancer s/p BCG x 6 and surveillance cystoscopy who presents today for #1/3 maintenance BCG.    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  Most recent recurrence of CIS diagnosed on office biopsy on 09/24/2016. He underwent follow-up fulguration of this lesion in the office on 11/20/2016 with mitomycin.    He has since started BCG and has had 2 instillations with Dr. Jacqlyn Larsen. This course was started on 12/04/2016 and a second dose was administered on 12/18/2016.  His subsequent dose was held in the setting of urinary frequency, burning with urination, temp to 99, and malaise.  He was treated for urinary tract infection and further treatments were deferred.  He then transferred his care to [facility] and complete the course which finished on 01/22/17.  The remained of the course was well tolerated.  No recent upper tract imaging.  Surveillance cystoscopy on 04/23/2017 was negative for recurrence. Urine cytology was negative for malignant cells.  History of kidney stones ESWL x 1 several years ago.   No recent flank pain or stone episodes.    BPH Most recent PSA 1.08 on 2015.   Managed on Flomax/finasteride  Symptoms primarily urgency/frequency, stable  Today, he is not experiencing fevers, chills, nausea or vomiting. He has no cough. He is not having dysuria, gross hematuria or suprapubic pain. He does state he has frequent urination and nocturia.  His UA today is positive for 3-10 rbc's per high-power field.     PMH: Past Medical History:  Diagnosis Date  . Allergic rhinitis   . Cancer  [facility])    Bladder Cancer  . Cancer [facility])    Prostate Cancer  . Diabetes mellitus without complication ([place])   . Diverticulosis   . Dysphagia   . GERD (gastroesophageal reflux disease)   . Hypertension   . Stroke [facility])     Surgical History: Past Surgical History:  Procedure Laterality Date  . ESOPHAGOGASTRODUODENOSCOPY N/A 04/21/2015   Procedure: ESOPHAGOGASTRODUODENOSCOPY (EGD);  Surgeon: Hulen Luster, MD;  Location: [facility] ENDOSCOPY;  Service: Gastroenterology;  Laterality: N/A;  . SINUS SURGERY WITH INSTATRAK  2008  . STAPEDES SURGERY    . TONSILLECTOMY      Home Medications:  Allergies as of 04/30/2017      Reactions   Sulfa Antibiotics Anaphylaxis   Tongue swelling.   Prilosec [omeprazole]    GI sxs   Sulfur Swelling   In throat and tongue       Medication List       Accurate as of 04/30/17 11:52 AM. Always use your most recent med list.          acetaminophen 500 MG tablet Commonly known as:  TYLENOL Take 500 mg by mouth every 6 (six) hours as needed for moderate pain.   Calcium Carbonate-Vitamin D3 600-400 MG-UNIT Tabs Take by mouth.   Calcium-D 600-400 MG-UNIT Tabs Take 1 tablet by mouth daily.   finasteride 5 MG tablet Commonly known as:  PROSCAR TAKE 1 TABLET BY MOUTH DAILY.   fluticasone 50 MCG/ACT nasal spray Commonly known as:  [place]  2 sprays into both nostrils daily.   glimepiride 4 MG tablet Commonly known as:  AMARYL Take 1 tablet by mouth daily before breakfast.   lisinopril 10 MG tablet Commonly known as:  PRINIVIL,ZESTRIL Take 10 mg by mouth 2 (two) times daily.   magnesium gluconate 500 MG tablet Commonly known as:  MAGONATE Take 500 mg by mouth daily.   Magnesium Oxide 500 MG Tabs Take by mouth.   metFORMIN 500 MG tablet Commonly known as:  GLUCOPHAGE Take 1,000 mg by mouth 2 (two) times daily.   PRESERVISION AREDS PO Take 1 tablet by mouth 2 (two) times daily.   PRESERVISION/LUTEIN PO Take by mouth.   primidone 50  MG tablet Commonly known as:  MYSOLINE Take 25 mg by mouth daily.   propranolol 20 MG tablet Commonly known as:  INDERAL Take 1 tablet by mouth 2 (two) times daily.   tamsulosin 0.4 MG Caps capsule Commonly known as:  FLOMAX TAKE 1 CAPSULE BY MOUTH DAILY.   VITAMIN B 12 PO Take by mouth.       Allergies:  Allergies  Allergen Reactions  . Sulfa Antibiotics Anaphylaxis    Tongue swelling.  . Prilosec [Omeprazole]     GI sxs  . Sulfur Swelling    In throat and tongue     Family History: Family History  Problem Relation Age of Onset  . Prostate cancer Neg Hx   . Bladder Cancer Neg Hx   . Kidney cancer Neg Hx     Social History:  reports that he has quit smoking. He has never used smokeless tobacco. He reports that he does not drink alcohol or use drugs.   Physical Exam: BP (!) 149/80   Pulse 69   Ht 5\' 10"  (1.778 m)   Wt 219 lb 12.8 oz (99.7 kg)   BMI 31.54 kg/m   Constitutional:  Alert and oriented, No acute distress.   HEENT: [place] AT, moist mucus membranes.  Trachea midline, no masses.  Severely hard of hearing. Cardiovascular: No clubbing, cyanosis, or edema. Respiratory: Normal respiratory effort, no increased work of breathing. GI: Abdomen is soft, nontender, nondistended, no abdominal masses GU: Circumcised phallus with orthotopic meatus. Bilateral descended testicles. Skin: No rashes, bruises or suspicious lesions. Neurologic: Grossly intact, no focal deficits, moving all 4 extremities. Psychiatric: Normal mood and affect.  Laboratory Data: Lab Results  Component Value Date   WBC 7.4 05/10/2016   HGB 14.3 05/10/2016   HCT 42.5 05/10/2016   MCV 92.2 05/10/2016   PLT 150 05/10/2016    Lab Results  Component Value Date   CREATININE 0.89 05/10/2016   Urinalysis 3-10 RBC's.  See EPIC.     Pertinent Imaging: No recent imaging  Assessment & Plan:    1. History of CIS (carcinoma in situ of bladder)  - Reviewed BCG treatment course, possible  side effects including BCG sepsis, bladder irritation, worsening of his urinary symptoms  - # 1 of 3 BCG installed today  - Patient was instructed to pour bleach down his/her toilet for the next 6 hours  -  Instructed to call the office if she should experience fevers greater than 102, chills/rigors, onset of a new cough, night sweats or further bladder spasms or inability to urinate   - RTC in one week for # 2 BCG  - Recommend continuation of maintenance BCG on a schedule of 3 months, 6 months, 12 months, 18 months, 24 months, 30 months, and 36 months.   - Surveillance  protocol also discussed today including cystoscopy every 3 months for at least 2 years and then spread out thereafter  - Urinalysis, Complete  - bcg vaccine injection 81 mg; Instill 3.24 mLs (81 mg total) into the bladder once.  - lidocaine (XYLOCAINE) 2 % jelly 1 application; Place 1 application into the urethra once.  2. History of kidney stones Currently asymptomatic, no recent stone episodes We'll continue to follow  3. Benign prostatic hyperplasia with urinary frequency Continue Flomax/finasteride   Return in about 1 week (around 05/07/2017) for # 2/3 BCG.  Zara Council, [facility] [address] [place], [place] [address] [phone]

## 2017-04-30 ENCOUNTER — Ambulatory Visit (INDEPENDENT_AMBULATORY_CARE_PROVIDER_SITE_OTHER): Payer: Medicare Other | Admitting: Urology

## 2017-04-30 ENCOUNTER — Encounter: Payer: Self-pay | Admitting: Urology

## 2017-04-30 VITALS — BP 149/80 | HR 69 | Ht 70.0 in | Wt 219.8 lb

## 2017-04-30 DIAGNOSIS — Z8551 Personal history of malignant neoplasm of bladder: Secondary | ICD-10-CM

## 2017-04-30 LAB — URINALYSIS, COMPLETE
Bilirubin, UA: NEGATIVE
Ketones, UA: NEGATIVE
Leukocytes, UA: NEGATIVE
NITRITE UA: NEGATIVE
PH UA: 7.5 (ref 5.0–7.5)
Specific Gravity, UA: 1.02 (ref 1.005–1.030)
Urobilinogen, Ur: 0.2 mg/dL (ref 0.2–1.0)

## 2017-04-30 LAB — MICROSCOPIC EXAMINATION
Bacteria, UA: NONE SEEN
WBC, UA: NONE SEEN /hpf (ref 0–?)

## 2017-04-30 MED ORDER — BCG LIVE 50 MG IS SUSR
3.2400 mL | Freq: Once | INTRAVESICAL | Status: AC
  Administered 2017-04-30: 81 mg via INTRAVESICAL

## 2017-04-30 MED ORDER — LIDOCAINE HCL 2 % EX GEL
1.0000 "application " | Freq: Once | CUTANEOUS | Status: AC
  Administered 2017-04-30: 1 via URETHRAL

## 2017-04-30 NOTE — Addendum Note (Signed)
Addended by: Orlene Erm on: 04/30/2017 12:19 PM   Modules accepted: Orders

## 2017-04-30 NOTE — Progress Notes (Signed)
BCG Bladder Instillation  BCG # 1 of 3  Due to Bladder Cancer patient is present today for a BCG treatment. Patient was cleaned and prepped in a sterile fashion with betadine and lidocaine 2% jelly was instilled into the urethra.  A 14FR catheter was inserted, urine return was noted 10ml, urine was yellow in color.  50ml of reconstituted BCG was instilled into the bladder. The catheter was then removed. Patient tolerated well, no complications were noted .  Preformed by: Shannon McGowan PA-C and Melvinia Ashby CMA  Follow up/ Additional notes: One week   

## 2017-05-06 ENCOUNTER — Encounter: Payer: Self-pay | Admitting: Urology

## 2017-05-06 ENCOUNTER — Ambulatory Visit: Payer: Medicare Other | Admitting: Urology

## 2017-05-06 VITALS — BP 161/69 | HR 65 | Ht 70.0 in | Wt 217.7 lb

## 2017-05-06 DIAGNOSIS — C679 Malignant neoplasm of bladder, unspecified: Secondary | ICD-10-CM

## 2017-05-06 LAB — URINALYSIS, COMPLETE
Bilirubin, UA: NEGATIVE
LEUKOCYTES UA: NEGATIVE
Nitrite, UA: NEGATIVE
PROTEIN UA: NEGATIVE
SPEC GRAV UA: 1.025 (ref 1.005–1.030)
Urobilinogen, Ur: 0.2 mg/dL (ref 0.2–1.0)
pH, UA: 5 (ref 5.0–7.5)

## 2017-05-06 LAB — MICROSCOPIC EXAMINATION

## 2017-05-06 MED ORDER — BCG LIVE 50 MG IS SUSR
3.2400 mL | Freq: Once | INTRAVESICAL | Status: AC
  Administered 2017-05-06: 81 mg via INTRAVESICAL

## 2017-05-06 MED ORDER — LIDOCAINE HCL 2 % EX GEL
1.0000 "application " | Freq: Once | CUTANEOUS | Status: AC
  Administered 2017-05-06: 1 via URETHRAL

## 2017-05-06 NOTE — Progress Notes (Signed)
BCG Bladder Instillation  BCG # 2 of 3   Pt here for BCG. He looks well. No dysuria or gross hematuria. Tolerating BCG.   Due to Bladder Cancer patient is present today for a BCG treatment. Patient was cleaned and prepped in a sterile fashion with betadine and lidocaine 2% jelly was instilled into the urethra.  A 14FR catheter was inserted, urine return was noted 59ml, urine was yellow in color.  3ml of reconstituted BCG was instilled into the bladder. The catheter was then removed. Patient tolerated well, no complications were noted.  Preformed by: Lyndee Hensen CMA and Fonnie Jarvis CMA  Follow up/ Additional notes: One week

## 2017-05-13 NOTE — Progress Notes (Signed)
05/14/2017 11:26 AM   Cherre Blanc November 07, 1934 [id number]  Referring provider: Rusty Aus, MD [facility] [place], [place] [address]  Chief Complaint  Patient presents with  . Bladder Cancer    BCG  3  of  28    HPI: 83 year old male with a history of bladder cancer s/p BCG x 6 and surveillance cystoscopy who presents today for #3/3 maintenance BCG.    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  Most recent recurrence of CIS diagnosed on office biopsy on 09/24/2016. He underwent follow-up fulguration of this lesion in the office on 11/20/2016 with mitomycin.    He has since started BCG and has had 2 instillations with Dr. Jacqlyn Larsen. This course was started on 12/04/2016 and a second dose was administered on 12/18/2016.  His subsequent dose was held in the setting of urinary frequency, burning with urination, temp to 99, and malaise.  He was treated for urinary tract infection and further treatments were deferred.  He then transferred his care to [facility] and complete the course which finished on 01/22/17.  The remained of the course was well tolerated.  No recent upper tract imaging.  Surveillance cystoscopy on 04/23/2017 was negative for recurrence. Urine cytology was negative for malignant cells.  History of kidney stones ESWL x 1 several years ago.   No recent flank pain or stone episodes.    BPH Most recent PSA 1.08 on 2015.   Managed on Flomax/finasteride  Symptoms primarily urgency/frequency, stable  Today, he is not experiencing fevers, chills, nausea or vomiting. He has no cough. He is not having dysuria, gross hematuria or suprapubic pain. He does state he has frequent urination and nocturia.  He did see some blood after his last treatment, but none recently.  He denies any new cough.  He was able to hold instillation for 2 hours. His UA today  is positive for 3-10 rbc's per high-power field.  PMH: Past Medical History:  Diagnosis Date  . Allergic rhinitis   . Cancer [facility])    Bladder Cancer  . Cancer [facility])    Prostate Cancer  . Diabetes mellitus without complication ([place])   . Diverticulosis   . Dysphagia   . GERD (gastroesophageal reflux disease)   . Hypertension   . Stroke [facility])     Surgical History: Past Surgical History:  Procedure Laterality Date  . ESOPHAGOGASTRODUODENOSCOPY N/A 04/21/2015   Procedure: ESOPHAGOGASTRODUODENOSCOPY (EGD);  Surgeon: Hulen Luster, MD;  [facility] END[facility]COPY;  Service: Gastroenterology;  Laterality: N/A;  . SINUS SURGERY WITH INSTATRAK  2008  . STAPEDES SURGERY    . TONSILLECTOMY      Home Medications:  Allergies as of 05/14/2017      Reactions   Sulfa Antibiotics Anaphylaxis   Tongue swelling.   Prilosec [omeprazole]    GI sxs   Sulfur Swelling   In throat and tongue       Medication List       Accurate as of 05/14/17 11:26 AM. Always use your most recent med list.          acetaminophen 500 MG tablet Commonly known as:  TYLENOL Take 500 mg by mouth every 6 (six) hours as needed for moderate pain.   aspirin EC 81 MG tablet Take 81 mg by mouth daily.   Calcium Carbonate-Vitamin D3 600-400 MG-UNIT Tabs Take by mouth.   Calcium-D 600-400  MG-UNIT Tabs Take 1 tablet by mouth daily.   finasteride 5 MG tablet Commonly known as:  PROSCAR TAKE 1 TABLET BY MOUTH DAILY.   fluticasone 50 MCG/ACT nasal spray Commonly known as:  FLONASE Place 2 sprays into both nostrils daily.   glimepiride 4 MG tablet Commonly known as:  AMARYL Take 1 tablet by mouth daily before breakfast.   lisinopril 10 MG tablet Commonly known as:  PRINIVIL,ZESTRIL Take 10 mg by mouth 2 (two) times daily.   magnesium gluconate 500 MG tablet Commonly known as:  MAGONATE Take 500 mg by mouth daily.   Magnesium Oxide 500 MG Tabs Take by mouth.   metFORMIN 500 MG tablet Commonly known as:   GLUCOPHAGE Take 1,000 mg by mouth 2 (two) times daily.   PRESERVISION AREDS PO Take 1 tablet by mouth 2 (two) times daily.   PRESERVISION/LUTEIN PO Take by mouth.   primidone 50 MG tablet Commonly known as:  MYSOLINE Take 25 mg by mouth daily.   propranolol 20 MG tablet Commonly known as:  INDERAL Take 1 tablet by mouth 2 (two) times daily.   tamsulosin 0.4 MG Caps capsule Commonly known as:  FLOMAX TAKE 1 CAPSULE BY MOUTH DAILY.   VITAMIN B 12 PO Take by mouth.       Allergies:  Allergies  Allergen Reactions  . Sulfa Antibiotics Anaphylaxis    Tongue swelling.  . Prilosec [Omeprazole]     GI sxs  . Sulfur Swelling    In throat and tongue     Family History: Family History  Problem Relation Age of Onset  . Prostate cancer Neg Hx   . Bladder Cancer Neg Hx   . Kidney cancer Neg Hx     Social History:  reports that he has quit smoking. He has never used smokeless tobacco. He reports that he does not drink alcohol or use drugs.   Physical Exam: BP 138/70   Pulse 77   Ht 5\' 10"  (1.778 m)   Wt 216 lb 8 oz (98.2 kg)   BMI 31.06 kg/m   Constitutional:  Alert and oriented, No acute distress.   HEENT: [place] AT, moist mucus membranes.  Trachea midline, no masses.  Severely hard of hearing. Cardiovascular: No clubbing, cyanosis, or edema. Respiratory: Normal respiratory effort, no increased work of breathing. GI: Abdomen is soft, nontender, nondistended, no abdominal masses GU: Circumcised phallus with orthotopic meatus. Bilateral descended testicles. Skin: No rashes, bruises or suspicious lesions. Neurologic: Grossly intact, no focal deficits, moving all 4 extremities. Psychiatric: Normal mood and affect.  Laboratory Data: Lab Results  Component Value Date   WBC 7.4 05/10/2016   HGB 14.3 05/10/2016   HCT 42.5 05/10/2016   MCV 92.2 05/10/2016   PLT 150 05/10/2016    Lab Results  Component Value Date   CREATININE 0.89 05/10/2016   Urinalysis 3-10  RBC's.  See EPIC.     Pertinent Imaging: No recent imaging  Assessment & Plan:    1. History of CIS (carcinoma in situ of bladder)  - Reviewed BCG treatment course, possible side effects including BCG sepsis, bladder irritation, worsening of his urinary symptoms  - # 3 of 3 BCG installed today  - Patient was instructed to pour bleach down his/her toilet for the next 6 hours  -  Instructed to call the office if she should experience fevers greater than 102, chills/rigors, onset of a new cough, night sweats or further bladder spasms or inability to urinate   - RTC  on 08/15/2017 for surveillance cystoscopy  - Recommend continuation of maintenance BCG on a schedule of 3 months, 6 months, 12 months, 18 months, 24 months, 30 months, and 36 months.   - Surveillance protocol also discussed today including cystoscopy every 3 months for at least 2 years and then spread out thereafter  - Urinalysis, Complete  - bcg vaccine injection 81 mg; Instill 3.24 mLs (81 mg total) into the bladder once.  - lidocaine (XYLOCAINE) 2 % jelly 1 application; Place 1 application into the urethra once.  2. History of kidney stones Currently asymptomatic, no recent stone episodes We'll continue to follow  3. Benign prostatic hyperplasia with urinary frequency Continue Flomax/finasteride  4. ED  - patient would like a script for sildenafil 20 mg tablets  - he is not taking a nitroglycerin products and does not have severe cardiac issues, so a script for sildenafil 20 mg, 3 to 5 tablets two hours prior to intercourse on an empty stomach, # 50; he is warned not to take medications that contain nitrates.  I also advised him of the side effects, such as: headache, flushing, dyspepsia, abnormal vision, nasal congestion, back pain, myalgia, nausea, dizziness, and rash is given     Return for RTC on 09/21 for cystoscopy.  Zara Council, [facility] [address] [place], [place] [address] [phone]

## 2017-05-14 ENCOUNTER — Ambulatory Visit (INDEPENDENT_AMBULATORY_CARE_PROVIDER_SITE_OTHER): Payer: Medicare Other | Admitting: Urology

## 2017-05-14 ENCOUNTER — Encounter: Payer: Self-pay | Admitting: Urology

## 2017-05-14 VITALS — BP 138/70 | HR 77 | Ht 70.0 in | Wt 216.5 lb

## 2017-05-14 DIAGNOSIS — N529 Male erectile dysfunction, unspecified: Secondary | ICD-10-CM

## 2017-05-14 DIAGNOSIS — C679 Malignant neoplasm of bladder, unspecified: Secondary | ICD-10-CM

## 2017-05-14 LAB — URINALYSIS, COMPLETE
BILIRUBIN UA: NEGATIVE
Leukocytes, UA: NEGATIVE
Nitrite, UA: NEGATIVE
Protein, UA: NEGATIVE
SPEC GRAV UA: 1.02 (ref 1.005–1.030)
UUROB: 0.2 mg/dL (ref 0.2–1.0)
pH, UA: 7 (ref 5.0–7.5)

## 2017-05-14 LAB — MICROSCOPIC EXAMINATION
Bacteria, UA: NONE SEEN
Epithelial Cells (non renal): NONE SEEN /hpf (ref 0–10)

## 2017-05-14 MED ORDER — BCG LIVE 50 MG IS SUSR
3.2400 mL | Freq: Once | INTRAVESICAL | Status: AC
  Administered 2017-05-14: 81 mg via INTRAVESICAL

## 2017-05-14 MED ORDER — LIDOCAINE HCL 2 % EX GEL
1.0000 "application " | Freq: Once | CUTANEOUS | Status: AC
  Administered 2017-05-14: 1 via URETHRAL

## 2017-05-14 MED ORDER — SILDENAFIL CITRATE 20 MG PO TABS: ORAL_TABLET | ORAL | 3 refills | Status: DC

## 2017-05-14 NOTE — Progress Notes (Addendum)
BCG Bladder Instillation  BCG # 3 of 3  Due to Bladder Cancer patient is present today for a BCG treatment. Patient was cleaned and prepped in a sterile fashion with betadine and lidocaine 2% jelly was instilled into the urethra.  A 14FR catheter was inserted, urine return was noted  180 ml, urine was yellow in color.  69ml of reconstituted BCG was instilled into the bladder. The catheter was then removed. Patient tolerated well, no complications were noted.  Preformed by: Zara Council PA-C and Lyndee Hensen CMA  Follow up/ Additional notes: Cystoscopy in 3 months

## 2017-07-22 ENCOUNTER — Other Ambulatory Visit: Payer: Self-pay | Admitting: Urology

## 2017-08-15 ENCOUNTER — Ambulatory Visit (INDEPENDENT_AMBULATORY_CARE_PROVIDER_SITE_OTHER): Payer: Medicare Other | Admitting: Urology

## 2017-08-15 ENCOUNTER — Encounter: Payer: Self-pay | Admitting: Urology

## 2017-08-15 VITALS — BP 151/84 | HR 66 | Ht 70.0 in | Wt 216.0 lb

## 2017-08-15 DIAGNOSIS — C679 Malignant neoplasm of bladder, unspecified: Secondary | ICD-10-CM

## 2017-08-15 LAB — MICROSCOPIC EXAMINATION: BACTERIA UA: NONE SEEN

## 2017-08-15 LAB — URINALYSIS, COMPLETE
BILIRUBIN UA: NEGATIVE
KETONES UA: NEGATIVE
Leukocytes, UA: NEGATIVE
NITRITE UA: NEGATIVE
Protein, UA: NEGATIVE
SPEC GRAV UA: 1.02 (ref 1.005–1.030)
Urobilinogen, Ur: 0.2 mg/dL (ref 0.2–1.0)
pH, UA: 6 (ref 5.0–7.5)

## 2017-08-15 MED ORDER — LIDOCAINE HCL 2 % EX GEL
1.0000 "application " | Freq: Once | CUTANEOUS | Status: AC
  Administered 2017-08-15: 1 via URETHRAL

## 2017-08-15 MED ORDER — CIPROFLOXACIN HCL 500 MG PO TABS
500.0000 mg | ORAL_TABLET | Freq: Once | ORAL | Status: AC
  Administered 2017-08-15: 500 mg via ORAL

## 2017-08-15 NOTE — Progress Notes (Signed)
   08/15/17  CC:  Chief Complaint  Patient presents with  . Cysto    HPI: 83 year old male with a history of bladder cancer who returns today for surveillance cystoscopy.  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  Most recent recurrence of CIS diagnosed on office biopsy on 09/24/2016. He underwent follow-up fulguration of this lesion in the office on 11/20/2016 with mitomycin.    He has since started BCG and has had 2 instillations with Dr. Jacqlyn Larsen. This course was started on 12/04/2016 and a second dose was administered on 12/18/2016 then transferred his care to [facility] and complete the course which finished on 01/22/17.   He is since completed maintenance BCG in 04/2017.    Most recent cystoscopy 04/23/2017 negative for any recurrence.  Cytology negative.    No recent upper tract imaging.  Blood pressure (!) 151/84, pulse 66, height 5\' 10"  (1.778 m), weight 216 lb (98 kg). NED. A&Ox3.   No respiratory distress   Abd soft, NT, ND Normal phallus with bilateral descended testicles  Cystoscopy Procedure Note  Patient identification was confirmed, informed consent was obtained, and patient was prepped using Betadine solution.  Lidocaine jelly was administered per urethral meatus.    Preoperative abx where received prior to procedure.     Pre-Procedure: - Inspection reveals a normal caliber ureteral meatus.  Procedure: The flexible cystoscope was introduced without difficulty - No urethral strictures/lesions are present. - Enlarged prostate  - Normal bladder neck - Bilateral ureteral orifices identified - Bladder mucosa  reveals no ulcers, tumors, or lesions.  Multiple previous stellate scars appreciated. - No bladder stones - No trabeculation  Retroflexion unremarkable other than some mild hypervascularity around the bladder neck.  Post-Procedure: - Patient tolerated the procedure  well  Assessment/ Plan:  1. History of CIS (carcinoma in situ of bladder) Cystoscopy today unremarkable Urine cytology sent, pending Consider repeat biopsy, bilateral retrograde pyelogram if cytology is suspicious or positive Recommend continuation of maintenance BCG on a schedule of 3 months, 6 months, 12 months, 18 months, 24 months, 30 months, and 36 months.   2. History of kidney stones Currently asymptomatic, no recent stone episodes We'll continue to follow  3. Benign prostatic hyperplasia with urinary frequency Continue Flomax/finasteride  Return for Maintenance BCG 3 (starting next week) then cysto in 3 months after completion.   Hollice Espy, MD

## 2017-08-19 NOTE — Progress Notes (Signed)
08/20/2017 10:27 PM   Casey Adkins 1934/05/11 [id number]  Referring provider: Rusty Aus, MD [facility] [place], [place] [address]  Chief Complaint  Patient presents with  . BCG    #50    HPI: 83 year old male with a history of bladder cancer who presents today for #1/3 maintenance BCG.    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  Most recent recurrence of CIS diagnosed on office biopsy on 09/24/2016. He underwent follow-up fulguration of this lesion in the office on 11/20/2016 with mitomycin.    He has since started BCG and has had 2 instillations with Dr. Jacqlyn Larsen. This course was started on 12/04/2016 and a second dose was administered on 12/18/2016.  His subsequent dose was held in the setting of urinary frequency, burning with urination, temp to 99, and malaise.  He was treated for urinary tract infection and further treatments were deferred.  He then transferred his care to [facility] and complete the course which finished on 01/22/17.  The remained of the course was well tolerated.  No recent upper tract imaging.  Surveillance cystoscopy on 04/23/2017 was negative for recurrence. Urine cytology was negative for malignant cells.  Completed maintenance BCG in 04/2017  Surveillance cystoscopy completed in 07/2017 - NED  History of kidney stones ESWL x 1 several years ago.   No recent flank pain or stone episodes.    BPH Most recent PSA 1.08 on 2015.   Managed on Flomax/finasteride  Symptoms primarily urgency/frequency, stable  Today, he is not experiencing fevers, chills, nausea or vomiting. He has no cough. He is not having dysuria, gross hematuria or suprapubic pain. He does state he has frequent urination, urgency and nocturia.  His UA today is positive for 3-10 rbc's per high-power field.  PMH: Past Medical History:  Diagnosis Date    . Allergic rhinitis   . Cancer [facility])    Bladder Cancer  . Cancer [facility])    Prostate Cancer  . Diabetes mellitus without complication ([place])   . Diverticulosis   . Dysphagia   . GERD (gastroesophageal reflux disease)   . Hypertension   . Stroke [facility])     Surgical History: Past Surgical History:  Procedure Laterality Date  . ESOPHAGOGASTRODUODENOSCOPY N/A 04/21/2015   Procedure: ESOPHAGOGASTRODUODENOSCOPY (EGD);  Surgeon: Hulen Luster, MD;  Location: [facility] ENDOSCOPY;  Service: Gastroenterology;  Laterality: N/A;  . SINUS SURGERY WITH INSTATRAK  2008  . STAPEDES SURGERY    . TONSILLECTOMY      Home Medications:  Allergies as of 08/20/2017      Reactions   Sulfa Antibiotics Anaphylaxis   Tongue swelling.   Prilosec [omeprazole]    GI sxs   Sulfur Swelling   In throat and tongue       Medication List       Accurate as of 08/20/17 11:59 PM. Always use your most recent med list.          acetaminophen 500 MG tablet Commonly known as:  TYLENOL Take 500 mg by mouth every 6 (six) hours as needed for moderate pain.   aspirin EC 81 MG tablet Take 81 mg by mouth daily.   Calcium Carbonate-Vitamin D3 600-400 MG-UNIT Tabs Take by mouth.   Calcium-D 600-400 MG-UNIT Tabs Take 1 tablet by mouth daily.   finasteride 5 MG tablet Commonly known as:  PROSCAR TAKE 1 TABLET BY MOUTH DAILY.  fluticasone 50 MCG/ACT nasal spray Commonly known as:  FLONASE Place 2 sprays into both nostrils daily.   glimepiride 4 MG tablet Commonly known as:  AMARYL Take 1 tablet by mouth daily before breakfast.   lisinopril 10 MG tablet Commonly known as:  PRINIVIL,ZESTRIL Take 10 mg by mouth 2 (two) times daily.   magnesium gluconate 500 MG tablet Commonly known as:  MAGONATE Take 500 mg by mouth daily.   Magnesium Oxide 500 MG Tabs Take by mouth.   metFORMIN 500 MG tablet Commonly known as:  GLUCOPHAGE Take 1,000 mg by mouth 2 (two) times daily.   PRESERVISION AREDS PO Take 1 tablet  by mouth 2 (two) times daily.   primidone 50 MG tablet Commonly known as:  MYSOLINE Take 25 mg by mouth daily.   propranolol 20 MG tablet Commonly known as:  INDERAL Take 1 tablet by mouth 2 (two) times daily.   sildenafil 20 MG tablet Commonly known as:  REVATIO Take 3 to 5 tablets two hours before intercouse on an empty stomach.  Do not take with nitrates.   tamsulosin 0.4 MG Caps capsule Commonly known as:  FLOMAX TAKE 1 CAPSULE BY MOUTH DAILY.   VITAMIN B 12 PO Take by mouth.       Allergies:  Allergies  Allergen Reactions  . Sulfa Antibiotics Anaphylaxis    Tongue swelling.  . Prilosec [Omeprazole]     GI sxs  . Sulfur Swelling    In throat and tongue     Family History: Family History  Problem Relation Age of Onset  . Prostate cancer Neg Hx   . Bladder Cancer Neg Hx   . Kidney cancer Neg Hx     Social History:  reports that he has quit smoking. He has never used smokeless tobacco. He reports that he does not drink alcohol or use drugs.   Physical Exam: BP (!) 141/75   Pulse 70   Ht 5\' 10"  (1.778 m)   Wt 216 lb (98 kg)   BMI 30.99 kg/m   Constitutional:  Alert and oriented, No acute distress.   HEENT: [place] AT, moist mucus membranes.  Trachea midline, no masses.  Severely hard of hearing. Cardiovascular: No clubbing, cyanosis, or edema. Respiratory: Normal respiratory effort, no increased work of breathing. GI: Abdomen is soft, nontender, nondistended, no abdominal masses GU: Circumcised phallus with orthotopic meatus. Bilateral descended testicles. Skin: No rashes, bruises or suspicious lesions. Neurologic: Grossly intact, no focal deficits, moving all 4 extremities. Psychiatric: Normal mood and affect.  Laboratory Data: Urinalysis 3-10 RBC's.  See EPIC.    I have reviewed the labs   Assessment & Plan:    1. History of CIS (carcinoma in situ of bladder)  - Reviewed BCG treatment course, possible side effects including BCG sepsis, bladder  irritation, worsening of his urinary symptoms  - # 1 of 3 BCG installed today  - Patient was instructed to pour bleach down his/her toilet for the next 6 hours  -  Instructed to call the office if she should experience fevers greater than 102, chills/rigors, onset of a new cough, night sweats or further bladder spasms or inability to urinate   - RTC in one week for # 2/3  - Recommend continuation of maintenance BCG on a schedule of 3 months, 6 months, 12 months, 18 months, 24 months, 30 months, and 36 months.   - Surveillance protocol also discussed today including cystoscopy every 3 months for at least 2 years and then  spread out thereafter  - Urinalysis, Complete  - bcg vaccine injection 81 mg; Instill 3.24 mLs (81 mg total) into the bladder once.  - lidocaine (XYLOCAINE) 2 % jelly 1 application; Place 1 application into the urethra once.  2. History of kidney stones Currently asymptomatic, no recent stone episodes We'll continue to follow  3. Benign prostatic hyperplasia with urinary frequency Continue Flomax/finasteride  Return in about 1 week (around 08/27/2017) for # 2 BCG.  Zara Council, [facility] [address] [place], [place] [address] [phone]

## 2017-08-20 ENCOUNTER — Encounter: Payer: Self-pay | Admitting: Urology

## 2017-08-20 ENCOUNTER — Ambulatory Visit: Payer: Medicare Other | Admitting: Urology

## 2017-08-20 ENCOUNTER — Other Ambulatory Visit: Payer: Self-pay | Admitting: Urology

## 2017-08-20 VITALS — BP 141/75 | HR 70 | Ht 70.0 in | Wt 216.0 lb

## 2017-08-20 DIAGNOSIS — N401 Enlarged prostate with lower urinary tract symptoms: Secondary | ICD-10-CM

## 2017-08-20 DIAGNOSIS — C679 Malignant neoplasm of bladder, unspecified: Secondary | ICD-10-CM | POA: Diagnosis not present

## 2017-08-20 DIAGNOSIS — N138 Other obstructive and reflux uropathy: Secondary | ICD-10-CM | POA: Diagnosis not present

## 2017-08-20 DIAGNOSIS — Z87442 Personal history of urinary calculi: Secondary | ICD-10-CM

## 2017-08-20 LAB — MICROSCOPIC EXAMINATION

## 2017-08-20 LAB — URINALYSIS, COMPLETE
BILIRUBIN UA: NEGATIVE
LEUKOCYTES UA: NEGATIVE
Nitrite, UA: NEGATIVE
PH UA: 5.5 (ref 5.0–7.5)
PROTEIN UA: NEGATIVE
Urobilinogen, Ur: 0.2 mg/dL (ref 0.2–1.0)

## 2017-08-20 MED ORDER — BCG LIVE 50 MG IS SUSR
3.2400 mL | Freq: Once | INTRAVESICAL | Status: AC
  Administered 2017-08-20: 81 mg via INTRAVESICAL

## 2017-08-20 NOTE — Progress Notes (Signed)
BCG Bladder Instillation  BCG # Maint. #1  Due to Bladder Cancer patient is present today for a BCG treatment. Patient was cleaned and prepped in a sterile fashion with betadine and lidocaine 2% jelly was instilled into the urethra.  A 14 FR coude catheter was inserted, urine return was noted 80ml, urine was yellow in color.  64ml of reconstituted BCG was instilled into the bladder. The catheter was then removed. Patient tolerated well, complications were noted as: Patient had a bladder spasm after instillation when catheter was removed. Aprox. 62ml of reconstituted BCG was lost.  Preformed by: Fonnie Jarvis, CMA, [place]berta Leatherwood, [place]  Follow up/ Additional notes: next week for BCG

## 2017-08-25 NOTE — Progress Notes (Signed)
08/27/2017 3:24 PM   Casey Adkins [birth year] [id number]  Referring provider: Rusty Aus, MD [facility] [place], [place] [address]  Chief Complaint  Patient presents with  . Bladder Cancer    BCG  2       HPI: 83 year old male with a history of bladder cancer who presents today for #2/3 maintenance BCG.    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  Most recent recurrence of CIS diagnosed on office biopsy on 09/24/2016. He underwent follow-up fulguration of this lesion in the office on 11/20/2016 with mitomycin.    He has since started BCG and has had 2 instillations with Dr. Jacqlyn Larsen. This course was started on 12/04/2016 and a second dose was administered on 12/18/2016.  His subsequent dose was held in the setting of urinary frequency, burning with urination, temp to 99, and malaise.  He was treated for urinary tract infection and further treatments were deferred.  He then transferred his care to [facility] and complete the course which finished on 01/22/17.  The remained of the course was well tolerated.  No recent upper tract imaging.  Surveillance cystoscopy on 04/23/2017 was negative for recurrence. Urine cytology was negative for malignant cells.  Completed maintenance BCG in 04/2017  Surveillance cystoscopy completed in 07/2017 - NED  History of kidney stones ESWL x 1 several years ago.   No recent flank pain or stone episodes.    BPH Most recent PSA 1.08 on 2015.   Managed on Flomax/finasteride  Symptoms primarily urgency/frequency, stable  Today, he is not experiencing fevers, chills, nausea or vomiting. He has no cough. He is not having dysuria, gross hematuria or suprapubic pain. He does state he has frequent urination, urgency and nocturia.  His UA today is positive for 3-10 rbc's per high-power field.   PMH: Past Medical History:    Diagnosis Date  . Allergic rhinitis   . Cancer [facility])    Bladder Cancer  . Cancer [facility])    Prostate Cancer  . Diabetes mellitus without complication ([place])   . Diverticulosis   . Dysphagia   . GERD (gastroesophageal reflux disease)   . Hypertension   . Stroke [facility])     Surgical History: Past Surgical History:  Procedure Laterality Date  . ESOPHAGOGASTRODUODENOSCOPY N/A 04/21/2015   Procedure: ESOPHAGOGASTRODUODENOSCOPY (EGD);  Surgeon: Hulen Luster, MD;  Location: [facility] ENDOSCOPY;  Service: Gastroenterology;  Laterality: N/A;  . SINUS SURGERY WITH INSTATRAK  2008  . STAPEDES SURGERY    . TONSILLECTOMY      Home Medications:  Allergies as of 08/27/2017      Reactions   Sulfa Antibiotics Anaphylaxis   Tongue swelling.   Prilosec [omeprazole]    GI sxs   Sulfur Swelling   In throat and tongue       Medication List       Accurate as of 08/27/17  3:24 PM. Always use your most recent med list.          acetaminophen 500 MG tablet Commonly known as:  TYLENOL Take 500 mg by mouth every 6 (six) hours as needed for moderate pain.   aspirin EC 81 MG tablet Take 81 mg by mouth daily.   Calcium Carbonate-Vitamin D3 600-400 MG-UNIT Tabs Take by mouth.   Calcium-D 600-400 MG-UNIT Tabs Take 1 tablet by mouth daily.   finasteride 5 MG tablet Commonly known as:  PROSCAR TAKE 1 TABLET BY MOUTH DAILY.   fluticasone 50 MCG/ACT nasal spray Commonly known as:  FLONASE Place 2 sprays into both nostrils daily.   glimepiride 4 MG tablet Commonly known as:  AMARYL Take 1 tablet by mouth daily before breakfast.   lisinopril 10 MG tablet Commonly known as:  PRINIVIL,ZESTRIL Take 10 mg by mouth 2 (two) times daily.   magnesium gluconate 500 MG tablet Commonly known as:  MAGONATE Take 500 mg by mouth daily.   Magnesium Oxide 500 MG Tabs Take by mouth.   metFORMIN 500 MG tablet Commonly known as:  GLUCOPHAGE Take 1,000 mg by mouth 2 (two) times daily.   PRESERVISION AREDS  PO Take 1 tablet by mouth 2 (two) times daily.   primidone 50 MG tablet Commonly known as:  MYSOLINE Take 25 mg by mouth daily.   propranolol 20 MG tablet Commonly known as:  INDERAL Take 1 tablet by mouth 2 (two) times daily.   tamsulosin 0.4 MG Caps capsule Commonly known as:  FLOMAX TAKE 1 CAPSULE BY MOUTH DAILY.   VITAMIN B 12 PO Take by mouth.       Allergies:  Allergies  Allergen Reactions  . Sulfa Antibiotics Anaphylaxis    Tongue swelling.  . Prilosec [Omeprazole]     GI sxs  . Sulfur Swelling    In throat and tongue     Family History: Family History  Problem Relation Age of Onset  . Prostate cancer Neg Hx   . Bladder Cancer Neg Hx   . Kidney cancer Neg Hx     Social History:  reports that he has quit smoking. He has never used smokeless tobacco. He reports that he does not drink alcohol or use drugs.   Physical Exam: BP 138/70   Pulse 74   Ht 5\' 10"  (1.778 m)   Wt 217 lb 1.6 oz (98.5 kg)   BMI 31.15 kg/m   Constitutional:  Alert and oriented, No acute distress.   HEENT: [place] AT, moist mucus membranes.  Trachea midline, no masses.  Severely hard of hearing. Cardiovascular: No clubbing, cyanosis, or edema. Respiratory: Normal respiratory effort, no increased work of breathing. GI: Abdomen is soft, nontender, nondistended, no abdominal masses GU: Circumcised phallus with orthotopic meatus. Bilateral descended testicles. Skin: No rashes, bruises or suspicious lesions. Neurologic: Grossly intact, no focal deficits, moving all 4 extremities. Psychiatric: Normal mood and affect.  Laboratory Data: Urinalysis 3-10 RBC's.  See EPIC.    I have reviewed the labs   Assessment & Plan:    1. History of CIS (carcinoma in situ of bladder)  - Reviewed BCG treatment course, possible side effects including BCG sepsis, bladder irritation, worsening of his urinary symptoms  - # 2 of 3 BCG installed today  - Patient was instructed to pour bleach down his/her  toilet for the next 6 hours  -  Instructed to call the office if she should experience fevers greater than 102, chills/rigors, onset of a new cough, night sweats or further bladder spasms or inability to urinate   - RTC in one week for # 3/3  - Recommend continuation of maintenance BCG on a schedule of 3 months, 6 months, 12 months, 18 months, 24 months, 30 months, and 36 months.   - Surveillance protocol also discussed today including cystoscopy every 3 months for at least 2 years and then spread out thereafter  - Urinalysis, Complete  - bcg vaccine injection 81 mg; Instill 3.24 mLs (81 mg total)  into the bladder once.  - lidocaine (XYLOCAINE) 2 % jelly 1 application; Place 1 application into the urethra once.  2. History of kidney stones Currently asymptomatic, no recent stone episodes We'll continue to follow  3. Benign prostatic hyperplasia with urinary frequency Continue Flomax/finasteride  Return for # 3/3 BCG.  Zara Council, [facility] [address] [place], [place] [address] [phone]

## 2017-08-27 ENCOUNTER — Encounter: Payer: Self-pay | Admitting: Urology

## 2017-08-27 ENCOUNTER — Ambulatory Visit (INDEPENDENT_AMBULATORY_CARE_PROVIDER_SITE_OTHER): Payer: Medicare Other | Admitting: Urology

## 2017-08-27 VITALS — BP 138/70 | HR 74 | Ht 70.0 in | Wt 217.1 lb

## 2017-08-27 DIAGNOSIS — N401 Enlarged prostate with lower urinary tract symptoms: Secondary | ICD-10-CM | POA: Diagnosis not present

## 2017-08-27 DIAGNOSIS — C679 Malignant neoplasm of bladder, unspecified: Secondary | ICD-10-CM

## 2017-08-27 DIAGNOSIS — Z87442 Personal history of urinary calculi: Secondary | ICD-10-CM | POA: Diagnosis not present

## 2017-08-27 DIAGNOSIS — N138 Other obstructive and reflux uropathy: Secondary | ICD-10-CM | POA: Diagnosis not present

## 2017-08-27 LAB — URINALYSIS, COMPLETE
Bilirubin, UA: NEGATIVE
LEUKOCYTES UA: NEGATIVE
Nitrite, UA: NEGATIVE
Protein, UA: NEGATIVE
SPEC GRAV UA: 1.02 (ref 1.005–1.030)
Urobilinogen, Ur: 0.2 mg/dL (ref 0.2–1.0)
pH, UA: 5 (ref 5.0–7.5)

## 2017-08-27 LAB — MICROSCOPIC EXAMINATION
BACTERIA UA: NONE SEEN
EPITHELIAL CELLS (NON RENAL): NONE SEEN /HPF (ref 0–10)
WBC, UA: NONE SEEN /hpf (ref 0–?)

## 2017-08-27 MED ORDER — BCG LIVE 50 MG IS SUSR
3.2400 mL | Freq: Once | INTRAVESICAL | Status: AC
  Administered 2017-08-27: 81 mg via INTRAVESICAL

## 2017-08-27 MED ORDER — LIDOCAINE HCL 2 % EX GEL
1.0000 "application " | Freq: Once | CUTANEOUS | Status: AC
  Administered 2017-08-27: 1 via URETHRAL

## 2017-08-27 NOTE — Progress Notes (Signed)
BCG Bladder Instillation  BCG # 2 of 3  Due to Bladder Cancer patient is present today for a BCG treatment. Patient was cleaned and prepped in a sterile fashion with betadine and lidocaine 2% jelly was instilled into the urethra.  A 14FR catheter was inserted, urine return was noted 52ml, urine was yellow in color.  75ml of reconstituted BCG was instilled into the bladder. The catheter was then removed. Patient tolerated well, no complications were noted.  Preformed by: Zara Council PA-C and Lyndee Hensen CMA  Follow up/ Additional notes: One week

## 2017-09-02 NOTE — Progress Notes (Signed)
09/03/2017 3:26 PM   Casey Adkins 29-Apr-1934 [id number]  Referring provider: Rusty Aus, MD [facility] [place], [place] [address]  Chief Complaint  Patient presents with  . Bladder Cancer    BCG 3 of 64    HPI: 83 year old male with a history of bladder cancer who presents today for #3/3 maintenance BCG.    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  Most recent recurrence of CIS diagnosed on office biopsy on 09/24/2016. He underwent follow-up fulguration of this lesion in the office on 11/20/2016 with mitomycin.    He has since started BCG and has had 2 instillations with Dr. Jacqlyn Larsen. This course was started on 12/04/2016 and a second dose was administered on 12/18/2016.  His subsequent dose was held in the setting of urinary frequency, burning with urination, temp to 99, and malaise.  He was treated for urinary tract infection and further treatments were deferred.  He then transferred his care to [facility] and complete the course which finished on 01/22/17.  The remained of the course was well tolerated.  No recent upper tract imaging.  Surveillance cystoscopy on 04/23/2017 was negative for recurrence. Urine cytology was negative for malignant cells.  Completed maintenance BCG in 04/2017  Surveillance cystoscopy completed in 07/2017 - NED  History of kidney stones ESWL x 1 several years ago.   No recent flank pain or stone episodes.    BPH Most recent PSA 1.08 on 2015.   Managed on Flomax/finasteride  Symptoms primarily urgency/frequency, stable  Today, he is not experiencing fevers, chills, nausea or vomiting. He has no cough. He is not having dysuria, gross hematuria or suprapubic pain. He does state he has frequent urination, incontinence and nocturia.  His UA today is positive for 3-10 rbc's per high-power field.   PMH: Past Medical  History:  Diagnosis Date  . Allergic rhinitis   . Cancer [facility])    Bladder Cancer  . Cancer [facility])    Prostate Cancer  . Diabetes mellitus without complication ([place])   . Diverticulosis   . Dysphagia   . GERD (gastroesophageal reflux disease)   . Hypertension   . Stroke [facility])     Surgical History: Past Surgical History:  Procedure Laterality Date  . ESOPHAGOGASTRODUODENOSCOPY N/A 04/21/2015   Procedure: ESOPHAGOGASTRODUODENOSCOPY (EGD);  Surgeon: Hulen Luster, MD;  Location: [facility] ENDOSCOPY;  Service: Gastroenterology;  Laterality: N/A;  . SINUS SURGERY WITH INSTATRAK  2008  . STAPEDES SURGERY    . TONSILLECTOMY      Home Medications:  Allergies as of 09/03/2017      Reactions   Sulfa Antibiotics Anaphylaxis   Tongue swelling.   Prilosec [omeprazole]    GI sxs   Sulfur Swelling   In throat and tongue       Medication List       Accurate as of 09/03/17  3:26 PM. Always use your most recent med list.          acetaminophen 500 MG tablet Commonly known as:  TYLENOL Take 500 mg by mouth every 6 (six) hours as needed for moderate pain.   aspirin EC 81 MG tablet Take 81 mg by mouth daily.   Calcium Carbonate-Vitamin D3 600-400 MG-UNIT Tabs Take by mouth.   Calcium-D 600-400 MG-UNIT Tabs Take 1 tablet by mouth daily.   finasteride 5 MG tablet Commonly known as:  PROSCAR TAKE 1  TABLET BY MOUTH DAILY.   fluticasone 50 MCG/ACT nasal spray Commonly known as:  FLONASE Place 2 sprays into both nostrils daily.   glimepiride 4 MG tablet Commonly known as:  AMARYL Take 1 tablet by mouth daily before breakfast.   lisinopril 10 MG tablet Commonly known as:  PRINIVIL,ZESTRIL Take 10 mg by mouth 2 (two) times daily.   magnesium gluconate 500 MG tablet Commonly known as:  MAGONATE Take 500 mg by mouth daily.   Magnesium Oxide 500 MG Tabs Take by mouth.   metFORMIN 500 MG tablet Commonly known as:  GLUCOPHAGE Take 1,000 mg by mouth 2 (two) times daily.     PRESERVISION AREDS PO Take 1 tablet by mouth 2 (two) times daily.   primidone 50 MG tablet Commonly known as:  MYSOLINE Take 25 mg by mouth daily.   propranolol 20 MG tablet Commonly known as:  INDERAL Take 1 tablet by mouth 2 (two) times daily.   tamsulosin 0.4 MG Caps capsule Commonly known as:  FLOMAX TAKE 1 CAPSULE BY MOUTH DAILY.   VITAMIN B 12 PO Take by mouth.       Allergies:  Allergies  Allergen Reactions  . Sulfa Antibiotics Anaphylaxis    Tongue swelling.  . Prilosec [Omeprazole]     GI sxs  . Sulfur Swelling    In throat and tongue     Family History: Family History  Problem Relation Age of Onset  . Prostate cancer Neg Hx   . Bladder Cancer Neg Hx   . Kidney cancer Neg Hx     Social History:  reports that he has quit smoking. He has never used smokeless tobacco. He reports that he does not drink alcohol or use drugs.   Physical Exam: BP (!) 152/74   Pulse 65   Ht 5\' 10"  (1.778 m)   Wt 217 lb 8 oz (98.7 kg)   BMI 31.21 kg/m   Constitutional:  Alert and oriented, No acute distress.   HEENT: [place] AT, moist mucus membranes.  Trachea midline, no masses.  Severely hard of hearing. Cardiovascular: No clubbing, cyanosis, or edema. Respiratory: Normal respiratory effort, no increased work of breathing. GI: Abdomen is soft, nontender, nondistended, no abdominal masses GU: Circumcised phallus with orthotopic meatus. Bilateral descended testicles. Skin: No rashes, bruises or suspicious lesions. Neurologic: Grossly intact, no focal deficits, moving all 4 extremities. Psychiatric: Normal mood and affect.  Laboratory Data: Urinalysis 3-10 RBC's.  See EPIC.    I have reviewed the labs   Assessment & Plan:    1. History of CIS (carcinoma in situ of bladder)  - Reviewed BCG treatment course, possible side effects including BCG sepsis, bladder irritation, worsening of his urinary symptoms  - # 3 of 3 BCG installed today  - Patient was instructed to  pour bleach down his/her toilet for the next 6 hours  -  Instructed to call the office if she should experience fevers greater than 102, chills/rigors, onset of a new cough, night sweats or further bladder spasms or inability to urinate   - RTC on 12/02/2017 for surveillance cystoscopy   - Recommend continuation of maintenance BCG on a schedule of 3 months, 6 months, 12 months, 18 months, 24 months, 30 months, and 36 months.   - Surveillance protocol also discussed today including cystoscopy every 3 months for at least 2 years and then spread out thereafter  - Urinalysis, Complete  - bcg vaccine injection 81 mg; Instill 3.24 mLs (81 mg total) into  the bladder once.  - lidocaine (XYLOCAINE) 2 % jelly 1 application; Place 1 application into the urethra once.  2. History of kidney stones Currently asymptomatic, no recent stone episodes We'll continue to follow  3. Benign prostatic hyperplasia with urinary frequency Continue Flomax/finasteride  Return for surveilance cystosocpy on 12/02/2017.  Zara Council, [facility] [address] [place], [place] [address] [phone]

## 2017-09-03 ENCOUNTER — Ambulatory Visit (INDEPENDENT_AMBULATORY_CARE_PROVIDER_SITE_OTHER): Payer: Medicare Other | Admitting: Urology

## 2017-09-03 ENCOUNTER — Encounter: Payer: Self-pay | Admitting: Urology

## 2017-09-03 VITALS — BP 152/74 | HR 65 | Ht 70.0 in | Wt 217.5 lb

## 2017-09-03 DIAGNOSIS — Z87442 Personal history of urinary calculi: Secondary | ICD-10-CM

## 2017-09-03 DIAGNOSIS — N401 Enlarged prostate with lower urinary tract symptoms: Secondary | ICD-10-CM

## 2017-09-03 DIAGNOSIS — N138 Other obstructive and reflux uropathy: Secondary | ICD-10-CM

## 2017-09-03 DIAGNOSIS — C679 Malignant neoplasm of bladder, unspecified: Secondary | ICD-10-CM | POA: Diagnosis not present

## 2017-09-03 LAB — URINALYSIS, COMPLETE
BILIRUBIN UA: NEGATIVE
Glucose, UA: NEGATIVE
Leukocytes, UA: NEGATIVE
NITRITE UA: NEGATIVE
PH UA: 8.5 — AB (ref 5.0–7.5)
PROTEIN UA: NEGATIVE
Specific Gravity, UA: 1.02 (ref 1.005–1.030)
UUROB: 0.2 mg/dL (ref 0.2–1.0)

## 2017-09-03 LAB — MICROSCOPIC EXAMINATION
BACTERIA UA: NONE SEEN
Epithelial Cells (non renal): NONE SEEN /hpf (ref 0–10)
WBC UA: NONE SEEN /HPF (ref 0–?)

## 2017-09-03 MED ORDER — BCG LIVE 50 MG IS SUSR
3.2400 mL | Freq: Once | INTRAVESICAL | Status: AC
  Administered 2017-09-03: 81 mg via INTRAVESICAL

## 2017-09-03 MED ORDER — LIDOCAINE HCL 2 % EX GEL
1.0000 "application " | Freq: Once | CUTANEOUS | Status: AC
  Administered 2017-09-03: 1 via URETHRAL

## 2017-09-03 NOTE — Progress Notes (Signed)
BCG Bladder Instillation  BCG # 3 of 3  Due to Bladder Cancer patient is present today for a BCG treatment. Patient was cleaned and prepped in a sterile fashion with betadine and lidocaine 2% jelly was instilled into the urethra.  A 14FR catheter was inserted, urine return was noted 100 ml, urine was yellow in color.  76ml of reconstituted BCG was instilled into the bladder. The catheter was then removed. Patient tolerated well, no complications were noted.  Preformed by: Zara Council PA-C and Lyndee Hensen CMA  Follow up/ Additional notes: 3 months for Cystoscopy

## 2017-12-02 ENCOUNTER — Encounter: Payer: Self-pay | Admitting: Urology

## 2017-12-02 ENCOUNTER — Ambulatory Visit: Payer: Medicare Other | Admitting: Urology

## 2017-12-02 VITALS — BP 131/65 | HR 71 | Ht 70.0 in | Wt 215.0 lb

## 2017-12-02 DIAGNOSIS — N138 Other obstructive and reflux uropathy: Secondary | ICD-10-CM

## 2017-12-02 DIAGNOSIS — C679 Malignant neoplasm of bladder, unspecified: Secondary | ICD-10-CM

## 2017-12-02 DIAGNOSIS — Z87442 Personal history of urinary calculi: Secondary | ICD-10-CM

## 2017-12-02 DIAGNOSIS — N401 Enlarged prostate with lower urinary tract symptoms: Secondary | ICD-10-CM

## 2017-12-02 LAB — URINALYSIS, COMPLETE
Bilirubin, UA: NEGATIVE
LEUKOCYTES UA: NEGATIVE
Nitrite, UA: NEGATIVE
PH UA: 5 (ref 5.0–7.5)
PROTEIN UA: NEGATIVE
Specific Gravity, UA: 1.025 (ref 1.005–1.030)
Urobilinogen, Ur: 0.2 mg/dL (ref 0.2–1.0)

## 2017-12-02 LAB — MICROSCOPIC EXAMINATION: BACTERIA UA: NONE SEEN

## 2017-12-02 MED ORDER — CIPROFLOXACIN HCL 500 MG PO TABS
500.0000 mg | ORAL_TABLET | Freq: Once | ORAL | Status: AC
  Administered 2017-12-02: 500 mg via ORAL

## 2017-12-02 MED ORDER — LIDOCAINE HCL 2 % EX GEL
1.0000 "application " | Freq: Once | CUTANEOUS | Status: AC
  Administered 2017-12-02: 1 via URETHRAL

## 2017-12-02 NOTE — Progress Notes (Signed)
   12/08/17  CC:  Chief Complaint  Patient presents with  . Cysto    HPI: 84 year old male with a history of bladder cancer who returns today for surveillance cystoscopy.  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  Most recent recurrence of CIS diagnosed on office biopsy on 09/24/2016. He underwent follow-up fulguration of this lesion in the office on 11/20/2016 with mitomycin.    He has since started BCG and has had 2 instillations with Dr. Jacqlyn Larsen. This course was started on 12/04/2016 and a second dose was administered on 12/18/2016 then transferred his care to [facility] and complete the course which finished on 01/22/17.   He is since completed maintenance BCG in 04/2017 and 08/2017.  Most recent cystoscopy 04/23/2017 negative for any recurrence.  Cytology negative.    No recent upper tract imaging.  Blood pressure (!) 151/84, pulse 66, height 5\' 10"  (1.778 m), weight 216 lb (98 kg). NED. A&Ox3.   No respiratory distress   Abd soft, NT, ND Normal phallus with bilateral descended testicles  Cystoscopy Procedure Note  Patient identification was confirmed, informed consent was obtained, and patient was prepped using Betadine solution.  Lidocaine jelly was administered per urethral meatus.    Preoperative abx where received prior to procedure.     Pre-Procedure: - Inspection reveals a normal caliber ureteral meatus.  Procedure: The flexible cystoscope was introduced without difficulty - No urethral strictures/lesions are present. - Enlarged prostate  - Normal bladder neck - Bilateral ureteral orifices identified - Bladder mucosa  reveals no ulcers, tumors, or lesions.  Multiple previous stellate scars appreciated. - No bladder stones - No trabeculation  Retroflexion unremarkable other than some mild hypervascularity around the bladder neck.  Post-Procedure: - Patient tolerated the  procedure well  Assessment/ Plan:  1. History of CIS (carcinoma in situ of bladder) Cystoscopy today unremarkable Urine cytology sent, pending Consider repeat biopsy, bilateral retrograde pyelogram if cytology is suspicious or positive Recommend continuation of maintenance BCG on a schedule of 3 months, 6 months, 12 months, 18 months, 24 months, 30 months, and 36 months.   2. History of kidney stones Currently asymptomatic, no recent stone episodes We'll continue to follow  3. Benign prostatic hyperplasia with urinary frequency Continue Flomax/finasteride Interested in decreasing the number of medications he is on, may try a trial of holding flowmax for a week or 2 to see if this makes any effect on his urinary symptoms  Return in about 3 months (around 03/02/2018) for cysto.   Hollice Espy, MD

## 2017-12-09 ENCOUNTER — Other Ambulatory Visit: Payer: Self-pay | Admitting: Urology

## 2017-12-24 ENCOUNTER — Other Ambulatory Visit: Payer: Self-pay | Admitting: Urology

## 2018-01-25 ENCOUNTER — Other Ambulatory Visit: Payer: Self-pay | Admitting: Urology

## 2018-03-03 ENCOUNTER — Ambulatory Visit: Payer: Medicare Other | Admitting: Urology

## 2018-03-03 ENCOUNTER — Encounter: Payer: Self-pay | Admitting: Urology

## 2018-03-03 ENCOUNTER — Other Ambulatory Visit: Payer: Medicare Other | Admitting: Urology

## 2018-03-03 VITALS — BP 166/85 | HR 69 | Wt 218.0 lb

## 2018-03-03 DIAGNOSIS — D090 Carcinoma in situ of bladder: Secondary | ICD-10-CM

## 2018-03-03 DIAGNOSIS — Z8551 Personal history of malignant neoplasm of bladder: Secondary | ICD-10-CM

## 2018-03-03 LAB — URINALYSIS, COMPLETE
Bilirubin, UA: NEGATIVE
LEUKOCYTES UA: NEGATIVE
Nitrite, UA: NEGATIVE
PH UA: 7 (ref 5.0–7.5)
PROTEIN UA: NEGATIVE
Specific Gravity, UA: 1.025 (ref 1.005–1.030)
Urobilinogen, Ur: 0.2 mg/dL (ref 0.2–1.0)

## 2018-03-03 LAB — MICROSCOPIC EXAMINATION
Bacteria, UA: NONE SEEN
WBC, UA: NONE SEEN /hpf (ref 0–5)

## 2018-03-03 MED ORDER — CIPROFLOXACIN HCL 500 MG PO TABS
500.0000 mg | ORAL_TABLET | Freq: Once | ORAL | Status: AC
Start: 2018-03-03 — End: 2018-03-03
  Administered 2018-03-03: 500 mg via ORAL

## 2018-03-03 MED ORDER — LIDOCAINE HCL 2 % EX GEL
1.0000 "application " | Freq: Once | CUTANEOUS | Status: AC
  Administered 2018-03-03: 1 via URETHRAL

## 2018-03-03 NOTE — Progress Notes (Signed)
   03/03/18  CC:  Chief Complaint  Patient presents with  . Cysto    HPI: 84 year old male with a history of bladder cancer who returns today for surveillance cystoscopy.  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  Most recent recurrence of CIS diagnosed on office biopsy on 09/24/2016. He underwent follow-up fulguration of this lesion in the office on 11/20/2016 with mitomycin.    He has since started BCG and has had 2 instillations with Dr. Jacqlyn Larsen. This course was started on 12/04/2016 and a second dose was administered on 12/18/2016 then transferred his care to [facility] and complete the course which finished on 01/22/17.   He is since completed maintenance BCG in 04/2017 and 08/2017.  Most recent cystoscopy 04/23/2017 negative for any recurrence.  Cytology negative.    No recent upper tract imaging.  Blood pressure (!) 151/84, pulse 66, height 5\' 10"  (1.778 m), weight 216 lb (98 kg). NED. A&Ox3.   No respiratory distress   Abd soft, NT, ND Normal phallus with bilateral descended testicles  Cystoscopy Procedure Note  Patient identification was confirmed, informed consent was obtained, and patient was prepped using Betadine solution.  Lidocaine jelly was administered per urethral meatus.    Preoperative abx where received prior to procedure.     Pre-Procedure: - Inspection reveals a normal caliber ureteral meatus.  Procedure: The flexible cystoscope was introduced without difficulty - No urethral strictures/lesions are present. - Enlarged prostate  - Normal bladder neck - Bilateral ureteral orifices identified - Bladder mucosa  reveals no ulcers, tumors, or lesions.  Multiple previous stellate scars appreciated.  There is one very subtle area just beyond the left ureteral orifice which is ever so slightly erythematous with somewhat of a texture but no papillary change.  This is  nonspecific and not particularly pathognomonic for CIS. - No bladder stones - No trabeculation  Retroflexion unremarkable other than some mild hypervascularity around the bladder neck.  Post-Procedure: - Patient tolerated the procedure well  Assessment/ Plan:  1. History of CIS (carcinoma in situ of bladder) Cystoscopy today unremarkable other than small area just distal to the left UO Urine cytology sent, pending Consider repeat biopsy, bilateral retrograde pyelogram if cytology is suspicious or positive Continue to follow the small area if there is any change or progression, will plan for biopsy thereafter Ideally, we would continue maintenance BCG, but in light of national shortage, will defer at this time until the supplies been reestablished per [facility] memo  2. History of kidney stones Currently asymptomatic, no recent stone episodes We'll continue to follow  3. Benign prostatic hyperplasia with urinary frequency Continue Flomax/finasteride   Return in about 3 months (around 06/02/2018) for cysto/ cytology.   Hollice Espy, MD

## 2018-03-09 ENCOUNTER — Other Ambulatory Visit: Payer: Self-pay | Admitting: Urology

## 2018-03-10 ENCOUNTER — Other Ambulatory Visit: Payer: Self-pay | Admitting: Gastroenterology

## 2018-03-10 DIAGNOSIS — R1310 Dysphagia, unspecified: Secondary | ICD-10-CM

## 2018-03-17 ENCOUNTER — Ambulatory Visit
Admission: RE | Admit: 2018-03-17 | Discharge: 2018-03-17 | Disposition: A | Discharge status: Home / Self Care | Payer: Medicare Other | Source: Ambulatory Visit | Attending: Gastroenterology | Admitting: Gastroenterology

## 2018-03-17 DIAGNOSIS — R1310 Dysphagia, unspecified: Secondary | ICD-10-CM | POA: Diagnosis present

## 2018-03-17 DIAGNOSIS — K228 Other specified diseases of esophagus: Secondary | ICD-10-CM | POA: Diagnosis not present

## 2018-03-17 DIAGNOSIS — K222 Esophageal obstruction: Secondary | ICD-10-CM | POA: Diagnosis not present

## 2018-04-11 ENCOUNTER — Other Ambulatory Visit: Payer: Self-pay | Admitting: Urology

## 2018-04-15 ENCOUNTER — Other Ambulatory Visit: Payer: Self-pay

## 2018-04-15 MED ORDER — TAMSULOSIN HCL 0.4 MG PO CAPS: 0.4000 mg | ORAL_CAPSULE | Freq: Every day | ORAL | 0 refills | Status: DC

## 2018-06-10 ENCOUNTER — Encounter: Payer: Self-pay | Admitting: Urology

## 2018-06-10 ENCOUNTER — Ambulatory Visit: Payer: Medicare Other | Admitting: Urology

## 2018-06-10 VITALS — BP 136/77 | HR 76 | Ht 70.0 in | Wt 215.0 lb

## 2018-06-10 DIAGNOSIS — Z8551 Personal history of malignant neoplasm of bladder: Secondary | ICD-10-CM

## 2018-06-10 LAB — URINALYSIS, COMPLETE
BILIRUBIN UA: NEGATIVE
Ketones, UA: NEGATIVE
Leukocytes, UA: NEGATIVE
Nitrite, UA: NEGATIVE
PROTEIN UA: NEGATIVE
RBC UA: NEGATIVE
UUROB: 0.2 mg/dL (ref 0.2–1.0)
pH, UA: 5 (ref 5.0–7.5)

## 2018-06-10 LAB — MICROSCOPIC EXAMINATION

## 2018-06-10 MED ORDER — CIPROFLOXACIN HCL 500 MG PO TABS
500.0000 mg | ORAL_TABLET | Freq: Once | ORAL | Status: AC
  Administered 2018-06-10: 500 mg via ORAL

## 2018-06-10 MED ORDER — LIDOCAINE HCL URETHRAL/MUCOSAL 2 % EX GEL
1.0000 "application " | Freq: Once | CUTANEOUS | Status: AC
  Administered 2018-06-10: 1 via URETHRAL

## 2018-06-10 NOTE — Progress Notes (Signed)
   06/10/18  CC:  Chief Complaint  Patient presents with  . Cysto    HPI: 84 year old male with a history of bladder cancer who returns today for surveillance cystoscopy.  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  Most recent recurrence of CIS diagnosed on office biopsy on 09/24/2016. He underwent follow-up fulguration of this lesion in the office on 11/20/2016 with mitomycin.    He has since started BCG and has had 2 instillations with Dr. Jacqlyn Larsen. This course was started on 12/04/2016 and a second dose was administered on 12/18/2016 then transferred his care to [facility] and complete the course which finished on 01/22/17.   He is since completed maintenance BCG in 04/2017 and 08/2017.  Most recent cystoscopy 04/23/2017 negative for any recurrence.  Cytology negative.    No recent upper tract imaging.  Blood pressure (!) 151/84, pulse 66, height 5\' 10"  (1.778 m), weight 216 lb (98 kg). NED. A&Ox3.   No respiratory distress   Abd soft, NT, ND Normal phallus with bilateral descended testicles  Cystoscopy Procedure Note  Patient identification was confirmed, informed consent was obtained, and patient was prepped using Betadine solution.  Lidocaine jelly was administered per urethral meatus.    Preoperative abx where received prior to procedure.     Pre-Procedure: - Inspection reveals a normal caliber ureteral meatus.  Procedure: The flexible cystoscope was introduced without difficulty - No urethral strictures/lesions are present. - Enlarged prostate  - Normal bladder neck - Bilateral ureteral orifices identified - Bladder mucosa  reveals no ulcers, tumors, or lesions.  Multiple previous stellate scars appreciated.  There is one very subtle area just beyond the left ureteral orifice which is ever so slightly erythematous with somewhat of a texture but no papillary change.  This is  nonspecific and not particularly pathognomonic for CIS.  This completely unchanged since last visit.   - No bladder stones - No trabeculation  Retroflexion unremarkable other than some mild hypervascularity around the bladder neck.  Post-Procedure: - Patient tolerated the procedure well  Assessment/ Plan:  1. History of CIS (carcinoma in situ of bladder) Cystoscopy today unremarkable other than small area just distal to the left UO which remains stable, cytology 02/2018 negative Urine cytology sent, pending again today Consider repeat biopsy, bilateral retrograde pyelogram if cytology is suspicious or positive Ideally, we would continue maintenance BCG, but in light of national shortage, will defer at this time until the supplies been reestablished per [facility] memo  2. History of kidney stones Currently asymptomatic, no recent stone episodes We'll continue to follow  3. Benign prostatic hyperplasia with urinary frequency Continue Flomax/finasteride   Return in about 3 months (around 09/10/2018) for cysto.   Hollice Espy, MD

## 2018-06-11 ENCOUNTER — Other Ambulatory Visit: Payer: Self-pay

## 2018-06-11 MED ORDER — TAMSULOSIN HCL 0.4 MG PO CAPS: 0.4000 mg | ORAL_CAPSULE | Freq: Every day | ORAL | 0 refills | Status: DC

## 2018-06-15 ENCOUNTER — Other Ambulatory Visit: Payer: Self-pay | Admitting: Urology

## 2018-08-13 ENCOUNTER — Other Ambulatory Visit: Payer: Self-pay

## 2018-08-13 MED ORDER — TAMSULOSIN HCL 0.4 MG PO CAPS: 0.4000 mg | ORAL_CAPSULE | Freq: Every day | ORAL | 0 refills | Status: DC

## 2018-09-16 ENCOUNTER — Encounter: Payer: Self-pay | Admitting: Urology

## 2018-09-16 ENCOUNTER — Other Ambulatory Visit: Payer: Self-pay

## 2018-09-16 ENCOUNTER — Ambulatory Visit: Payer: Medicare Other | Admitting: Urology

## 2018-09-16 VITALS — BP 161/79 | HR 66 | Ht 70.0 in | Wt 213.6 lb

## 2018-09-16 DIAGNOSIS — Z8551 Personal history of malignant neoplasm of bladder: Secondary | ICD-10-CM | POA: Diagnosis not present

## 2018-09-16 NOTE — Addendum Note (Signed)
Addended by: Hollice Espy on: 09/16/2018 10:17 AM   Modules accepted: Level of Service

## 2018-09-16 NOTE — Progress Notes (Addendum)
   09/16/18  CC:  Chief Complaint  Patient presents with  . Cysto    HPI: 84 year old male with a history of bladder cancer who returns today for surveillance cystoscopy.  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  Most recent recurrence of CIS diagnosed on office biopsy on 09/24/2016. He underwent follow-up fulguration of this lesion in the office on 11/20/2016 with mitomycin.    He has since started BCG and has had 2 instillations with Dr. Jacqlyn Larsen. This course was started on 12/04/2016 and a second dose was administered on 12/18/2016 then transferred his care to [facility] and complete the course which finished on 01/22/17.   He is since completed maintenance BCG in 04/2017 and 08/2017.  Urine cytology has been negative to date in the office..    No recent upper tract imaging.  Blood pressure (!) 161/79, pulse 66, height 5\' 10"  (1.778 m), weight 213 lb 9.6 oz (96.9 kg). NED. A&Ox3.   No respiratory distress   Abd soft, NT, ND Normal phallus with bilateral descended testicles  Cystoscopy Procedure Note  Patient identification was confirmed, informed consent was obtained, and patient was prepped using Betadine solution.  Lidocaine jelly was administered per urethral meatus.    Preoperative abx where received prior to procedure.     Pre-Procedure: - Inspection reveals a normal caliber ureteral meatus.  Procedure: The flexible cystoscope was introduced without difficulty - No urethral strictures/lesions are present. - Enlarged prostate  - Normal bladder neck - Bilateral ureteral orifices identified - Bladder mucosa  reveals no ulcers, tumors, or lesions.  Multiple previous stellate scars appreciated.  ] - No bladder stones - No trabeculation  Retroflexion unremarkable other than some mild hypervascularity around the bladder neck.  Post-Procedure: - Patient tolerated the procedure  well  Assessment/ Plan:  1. History of CIS (carcinoma in situ of bladder) Cystoscopy today unremarkable  Urine cytology sent, pending again today Consider repeat biopsy, bilateral retrograde pyelogram if cytology is suspicious or positive Ideally, we would continue maintenance BCG, but in light of national shortage, will defer at this time until the supplies been reestablished per [facility] memo  2. History of kidney stones Currently asymptomatic, no recent stone episodes We'll continue to follow  3. Benign prostatic hyperplasia with urinary frequency Continue Flomax/finasteride   Return in about 6 months (around 03/18/2019) for cysto.   Hollice Espy, MD

## 2018-09-17 LAB — MICROSCOPIC EXAMINATION
Bacteria, UA: NONE SEEN
RBC, UA: NONE SEEN /hpf (ref 0–2)

## 2018-09-17 LAB — URINALYSIS, COMPLETE
Bilirubin, UA: NEGATIVE
LEUKOCYTES UA: NEGATIVE
Nitrite, UA: NEGATIVE
PROTEIN UA: NEGATIVE
RBC, UA: NEGATIVE
Specific Gravity, UA: 1.025 (ref 1.005–1.030)
Urobilinogen, Ur: 0.2 mg/dL (ref 0.2–1.0)
pH, UA: 5 (ref 5.0–7.5)

## 2018-09-21 ENCOUNTER — Other Ambulatory Visit: Payer: Self-pay | Admitting: Urology

## 2018-10-05 ENCOUNTER — Other Ambulatory Visit: Payer: Self-pay

## 2018-10-05 DIAGNOSIS — N401 Enlarged prostate with lower urinary tract symptoms: Principal | ICD-10-CM

## 2018-10-05 DIAGNOSIS — N138 Other obstructive and reflux uropathy: Secondary | ICD-10-CM

## 2018-10-05 MED ORDER — TAMSULOSIN HCL 0.4 MG PO CAPS: 0.4000 mg | ORAL_CAPSULE | Freq: Every day | ORAL | 3 refills | Status: DC

## 2018-12-13 ENCOUNTER — Other Ambulatory Visit: Payer: Self-pay | Admitting: Urology

## 2019-03-23 ENCOUNTER — Other Ambulatory Visit: Payer: Medicare Other | Admitting: Urology

## 2019-05-19 ENCOUNTER — Other Ambulatory Visit: Payer: Self-pay

## 2019-05-19 ENCOUNTER — Other Ambulatory Visit: Payer: Self-pay | Admitting: Radiology

## 2019-05-19 ENCOUNTER — Encounter: Payer: Self-pay | Admitting: Urology

## 2019-05-19 ENCOUNTER — Ambulatory Visit: Payer: Medicare Other | Admitting: Urology

## 2019-05-19 ENCOUNTER — Telehealth: Payer: Self-pay | Admitting: Radiology

## 2019-05-19 VITALS — BP 152/66 | HR 74 | Ht 70.0 in | Wt 216.0 lb

## 2019-05-19 DIAGNOSIS — Z8551 Personal history of malignant neoplasm of bladder: Secondary | ICD-10-CM

## 2019-05-19 DIAGNOSIS — N3289 Other specified disorders of bladder: Secondary | ICD-10-CM

## 2019-05-19 LAB — MICROSCOPIC EXAMINATION: Bacteria, UA: NONE SEEN

## 2019-05-19 LAB — URINALYSIS, COMPLETE
Bilirubin, UA: NEGATIVE
Leukocytes,UA: NEGATIVE
Nitrite, UA: NEGATIVE
Protein,UA: NEGATIVE
Specific Gravity, UA: 1.02 (ref 1.005–1.030)
Urobilinogen, Ur: 0.2 mg/dL (ref 0.2–1.0)
pH, UA: 5.5 (ref 5.0–7.5)

## 2019-05-19 NOTE — Telephone Encounter (Signed)
Patient was given the [facility] Information form below as well as the Instructions for Pre-Admission Testing form & a map of [facility].    [address], [address] [place], [place] [address] Telephone: [phone] Fax: [phone]   Thank you for choosing [facility] for your upcoming surgery!  We are always here to assist in your urological needs.  Please read the following information with specific details for your upcoming appointments related to your surgery. Please contact Ciel Yanes at [phone] Option 3 with any questions.  The Name of Your Surgery: Cystoscopy, bladder biopsy, bilateral retrograde pyelogram Your Surgery Date: 06/07/2019 Your Surgeon: Hollice Espy  Please call Same Day Surgery at [phone] between the hours of 1pm-3pm one day prior to your surgery. They will inform you of the time to arrive at Same Day Surgery which is located on the second floor of the [facility].   Please refer to the attached letter regarding instructions for Pre-Admission Testing. You will receive a call from the [place] office regarding your appointment with them.  The Pre-Admission Testing office is located at [place], on the first floor of the [facility] in [place] (office is to the right as you enter through the [employer] of the [facility]). Please have all medications you are currently taking and your insurance card available.  A COVID-19 test will be required prior to surgery and once test is performed you will need to remain in quarantine until the day of surgery. Patient was advised to have nothing to eat or drink after midnight the night prior to surgery except that he may have only water until 2 hours before surgery with nothing to drink within 2 hours of surgery.  The patient states he currently takes  aspirin 81mg  & was informed to continue medication per Dr Erlene Quan. Patient's questions were answered and he expressed understanding of these instructions.

## 2019-05-19 NOTE — Progress Notes (Signed)
   05/19/19  CC:  Chief Complaint  Patient presents with  . Cysto    HPI: 85 year old male with a history of bladder cancer who returns today for surveillance cystoscopy.  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  Most recent recurrence of CIS diagnosed on office biopsy on 09/24/2016. He underwent follow-up fulguration of this lesion in the office on 11/20/2016 with mitomycin.    He has since started BCG and has had 2 instillations with Dr. Jacqlyn Larsen. This course was started on 12/04/2016 and a second dose was administered on 12/18/2016 then transferred his care to [facility] and complete the course which finished on 01/22/17.    He is since completed maintenance BCG in 04/2017 and 08/2017.  Urine cytology has been negative to date in the office..    No recent upper tract imaging.  Blood pressure (!) 161/79, pulse 66, height 5\' 10"  (1.778 m), weight 213 lb 9.6 oz (96.9 kg). NED. A&Ox3.   No respiratory distress   Abd soft, NT, ND Normal phallus with bilateral descended testicles  Cystoscopy Procedure Note  Patient identification was confirmed, informed consent was obtained, and patient was prepped using Betadine solution.  Lidocaine jelly was administered per urethral meatus.    Preoperative abx where received prior to procedure.     Pre-Procedure: - Inspection reveals a normal caliber ureteral meatus.  Procedure: The flexible cystoscope was introduced without difficulty - No urethral strictures/lesions are present. - Enlarged prostate  - Normal bladder neck - Bilateral ureteral orifices identified - Bladder mucosa   today with a velvety erythematous lesion, approximately 1.5 cm in length on the left lateral bladder wall just beyond the UO.  This is more focal and well-defined than on previous cystoscopies concerning for possible CIS recurrence. - No bladder stones - No trabeculation   Retroflexion unremarkable other than some mild hypervascularity around the bladder neck.  Post-Procedure: - Patient tolerated the procedure well  Assessment/ Plan:  1. History of CIS (carcinoma in situ of bladder)/bladder erythema Focal area of bladder erythema today concerning for possible recurrence of CIS We discussed the findings with the patient extensively I have recommended that we return to the operating room for cystoscopy, bladder biopsy, bilateral retrograde for further evaluation.  We discussed the risk and benefits in detail including risk of bleeding, infection, damage surrounding structures to months others.  He is agreeable this plan.  2. History of kidney stones Currently asymptomatic  3. Benign prostatic hyperplasia with urinary frequency Continue Flomax/finasteride  Surgery as above  Hollice Espy, MD

## 2019-05-19 NOTE — H&P (View-Only) (Signed)
   05/19/19  CC:  Chief Complaint  Patient presents with  . Cysto    HPI: 85 year old male with a history of bladder cancer who returns today for surveillance cystoscopy.  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  Most recent recurrence of CIS diagnosed on office biopsy on 09/24/2016. He underwent follow-up fulguration of this lesion in the office on 11/20/2016 with mitomycin.    He has since started BCG and has had 2 instillations with Dr. Jacqlyn Larsen. This course was started on 12/04/2016 and a second dose was administered on 12/18/2016 then transferred his care to [facility] and complete the course which finished on 01/22/17.    He is since completed maintenance BCG in 04/2017 and 08/2017.  Urine cytology has been negative to date in the office..    No recent upper tract imaging.  Blood pressure (!) 161/79, pulse 66, height 5\' 10"  (1.778 m), weight 213 lb 9.6 oz (96.9 kg). NED. A&Ox3.   No respiratory distress   Abd soft, NT, ND Normal phallus with bilateral descended testicles  Cystoscopy Procedure Note  Patient identification was confirmed, informed consent was obtained, and patient was prepped using Betadine solution.  Lidocaine jelly was administered per urethral meatus.    Preoperative abx where received prior to procedure.     Pre-Procedure: - Inspection reveals a normal caliber ureteral meatus.  Procedure: The flexible cystoscope was introduced without difficulty - No urethral strictures/lesions are present. - Enlarged prostate  - Normal bladder neck - Bilateral ureteral orifices identified - Bladder mucosa   today with a velvety erythematous lesion, approximately 1.5 cm in length on the left lateral bladder wall just beyond the UO.  This is more focal and well-defined than on previous cystoscopies concerning for possible CIS recurrence. - No bladder stones - No trabeculation   Retroflexion unremarkable other than some mild hypervascularity around the bladder neck.  Post-Procedure: - Patient tolerated the procedure well  Assessment/ Plan:  1. History of CIS (carcinoma in situ of bladder)/bladder erythema Focal area of bladder erythema today concerning for possible recurrence of CIS We discussed the findings with the patient extensively I have recommended that we return to the operating room for cystoscopy, bladder biopsy, bilateral retrograde for further evaluation.  We discussed the risk and benefits in detail including risk of bleeding, infection, damage surrounding structures to months others.  He is agreeable this plan.  2. History of kidney stones Currently asymptomatic  3. Benign prostatic hyperplasia with urinary frequency Continue Flomax/finasteride  Surgery as above  Hollice Espy, MD

## 2019-05-21 LAB — CULTURE, URINE COMPREHENSIVE

## 2019-05-25 ENCOUNTER — Other Ambulatory Visit: Payer: Self-pay | Admitting: Urology

## 2019-05-26 ENCOUNTER — Telehealth: Payer: Self-pay | Admitting: Radiology

## 2019-05-26 NOTE — Telephone Encounter (Signed)
LMOM to notify patient of covid test and pre-admission testing appointment on 06/03/2019 at 10:00.

## 2019-06-03 ENCOUNTER — Encounter
Admission: RE | Admit: 2019-06-03 | Discharge: 2019-06-03 | Disposition: A | Discharge status: Home / Self Care | Payer: Medicare Other | Source: Ambulatory Visit | Attending: Urology | Admitting: Urology

## 2019-06-03 ENCOUNTER — Other Ambulatory Visit: Payer: Self-pay

## 2019-06-03 DIAGNOSIS — I10 Essential (primary) hypertension: Secondary | ICD-10-CM | POA: Insufficient documentation

## 2019-06-03 DIAGNOSIS — E119 Type 2 diabetes mellitus without complications: Secondary | ICD-10-CM | POA: Diagnosis not present

## 2019-06-03 DIAGNOSIS — Z1159 Encounter for screening for other viral diseases: Secondary | ICD-10-CM | POA: Insufficient documentation

## 2019-06-03 DIAGNOSIS — Z01818 Encounter for other preprocedural examination: Secondary | ICD-10-CM | POA: Insufficient documentation

## 2019-06-03 DIAGNOSIS — R9431 Abnormal electrocardiogram [ECG] [EKG]: Secondary | ICD-10-CM | POA: Insufficient documentation

## 2019-06-03 DIAGNOSIS — N3289 Other specified disorders of bladder: Secondary | ICD-10-CM | POA: Diagnosis not present

## 2019-06-03 DIAGNOSIS — Z87891 Personal history of nicotine dependence: Secondary | ICD-10-CM | POA: Diagnosis not present

## 2019-06-03 DIAGNOSIS — Z8551 Personal history of malignant neoplasm of bladder: Secondary | ICD-10-CM | POA: Diagnosis not present

## 2019-06-03 HISTORY — DX: Personal history of urinary calculi: Z87.442

## 2019-06-03 HISTORY — DX: Unspecified osteoarthritis, unspecified site: M19.90

## 2019-06-03 NOTE — Patient Instructions (Signed)
Your procedure is scheduled on: 06-07-19 MONDAY Report to Same Day Surgery 2nd floor medical mall [facility] Entrance-take elevator on left to 2nd floor.  Check in with surgery information desk.) To find out your arrival time please call [phone] between 1PM - 3PM on 06-04-19 FRIDAY  Remember: Instructions that are not followed completely may result in serious medical risk, up to and including death, or upon the discretion of your surgeon and anesthesiologist your surgery may need to be rescheduled.    _x___ 1. Do not eat food after midnight the night before your procedure. NO GUM OR CANDY AFTER MIDNIGHT. You may drink WATER up to 2 hours before you are scheduled to arrive at the hospital for your procedure.  Do not drink WATER  within 2 hours of your scheduled arrival to the hospital.  Type 1 and type 2 diabetics should only drink water.   ____Ensure clear carbohydrate drink on the way to the hospital for bariatric patients  ____Ensure clear carbohydrate drink 3 hours before surgery for Dr Dwyane Luo patients if physician instructed.     __x__ 2. No Alcohol for 24 hours before or after surgery.   __x__3. No Smoking or e-cigarettes for 24 prior to surgery.  Do not use any chewable tobacco products for at least 6 hour prior to surgery   ____  4. Bring all medications with you on the day of surgery if instructed.    __x__ 5. Notify your doctor if there is any change in your medical condition     (cold, fever, infections).    x___6. On the morning of surgery brush your teeth with toothpaste and water.  You may rinse your mouth with mouth wash if you wish.  Do not swallow any toothpaste or mouthwash.   Do not wear jewelry, make-up, hairpins, clips or nail polish.  Do not wear lotions, powders, or perfumes. You may wear deodorant.  Do not shave 48 hours prior to surgery. Men may shave face and neck.  Do not bring valuables to the hospital.    [facility] is not responsible for any  belongings or valuables.               Contacts, dentures or bridgework may not be worn into surgery.  Leave your suitcase in the car. After surgery it may be brought to your room.  For patients admitted to the hospital, discharge time is determined by your treatment team.  _  Patients discharged the day of surgery will not be allowed to drive home.  You will need someone to drive you home and stay with you the night of your procedure.    Please read over the following fact sheets that you were given:   [facility] Preparing for Surgery   _x___ TAKE THE FOLLOWING MEDICATION THE MORNING OF SURGERY WITH A SMALL SIP OF WATER. These include:  1. PROPRANOLOL   2. TAMSULOSIN  3. PRIMIDONE  4. MAGNESIUM  5.  6.  ____Fleets enema or Magnesium Citrate as directed.   ____ Use CHG Soap or sage wipes as directed on instruction sheet   ____ Use inhalers on the day of surgery and bring to hospital day of surgery  __X__ Stop Metformin 2 days prior to surgery-LAST DOSE ON Friday, July 10TH   ____ Take 1/2 of usual insulin dose the night before surgery and none on the morning surgery.   _x___ Follow recommendations from Cardiologist, Pulmonologist or PCP regarding stopping Aspirin, Coumadin, Plavix ,Eliquis, Effient,  or Pradaxa, and Pletal-ASK DR Erlene Quan ABOUT STOPPING YOUR ASPIRIN  X____Stop Anti-inflammatories such as Advil, Aleve, Ibuprofen, Motrin, Naproxen, Naprosyn, Goodies powders or aspirin products NOW-OK to take Tylenol   _X___ Stop supplements until after surgery-STOP PRESERVISION NOW-MAY RESUME AFTER SURGERY   ____ Bring C-Pap to the hospital.

## 2019-06-03 NOTE — Pre-Procedure Instructions (Signed)
Dr fitzgerald messaged regarding abnormal ekg. She reviewed EKG and compared it with prior Ekg and said Ekg is ok and ok to proceed as long as pt not having any symptoms. Pt asymptomatic

## 2019-06-04 LAB — SARS CORONAVIRUS 2 (TAT 6-24 HRS): SARS Coronavirus 2: NEGATIVE

## 2019-06-06 MED ORDER — CEFAZOLIN SODIUM-DEXTROSE 2-4 GM/100ML-% IV SOLN
2.0000 g | INTRAVENOUS | Status: AC
  Administered 2019-06-07: 15:00:00 2 g via INTRAVENOUS

## 2019-06-07 ENCOUNTER — Other Ambulatory Visit: Payer: Self-pay

## 2019-06-07 ENCOUNTER — Encounter: Payer: Self-pay | Admitting: *Deleted

## 2019-06-07 ENCOUNTER — Ambulatory Visit: Payer: Medicare Other | Admitting: Anesthesiology

## 2019-06-07 ENCOUNTER — Ambulatory Visit
Admission: RE | Admit: 2019-06-07 | Discharge: 2019-06-07 | Disposition: A | Discharge status: Home / Self Care | Payer: Medicare Other | Source: Ambulatory Visit | Attending: Urology | Admitting: Urology

## 2019-06-07 ENCOUNTER — Encounter
Admission: RE | Disposition: A | Discharge status: Home / Self Care | Payer: Self-pay | Source: Ambulatory Visit | Attending: Urology

## 2019-06-07 DIAGNOSIS — L538 Other specified erythematous conditions: Secondary | ICD-10-CM | POA: Diagnosis not present

## 2019-06-07 DIAGNOSIS — Z8551 Personal history of malignant neoplasm of bladder: Secondary | ICD-10-CM

## 2019-06-07 DIAGNOSIS — Z08 Encounter for follow-up examination after completed treatment for malignant neoplasm: Secondary | ICD-10-CM | POA: Diagnosis not present

## 2019-06-07 DIAGNOSIS — N3090 Cystitis, unspecified without hematuria: Secondary | ICD-10-CM | POA: Insufficient documentation

## 2019-06-07 DIAGNOSIS — N401 Enlarged prostate with lower urinary tract symptoms: Secondary | ICD-10-CM | POA: Diagnosis not present

## 2019-06-07 DIAGNOSIS — R350 Frequency of micturition: Secondary | ICD-10-CM | POA: Insufficient documentation

## 2019-06-07 DIAGNOSIS — N3289 Other specified disorders of bladder: Secondary | ICD-10-CM

## 2019-06-07 DIAGNOSIS — Z87442 Personal history of urinary calculi: Secondary | ICD-10-CM | POA: Diagnosis not present

## 2019-06-07 HISTORY — PX: CYSTOSCOPY WITH BIOPSY: SHX5122

## 2019-06-07 HISTORY — PX: CYSTOSCOPY W/ RETROGRADES: SHX1426

## 2019-06-07 LAB — GLUCOSE, CAPILLARY
Glucose-Capillary: 227 mg/dL — ABNORMAL HIGH (ref 70–99)
Glucose-Capillary: 199 mg/dL — ABNORMAL HIGH (ref 70–99)

## 2019-06-07 SURGERY — CYSTOSCOPY, WITH BIOPSY
Anesthesia: General | Site: Bladder

## 2019-06-07 MED ORDER — MIDAZOLAM HCL 2 MG/2ML IJ SOLN
INTRAMUSCULAR | Status: AC
  Filled 2019-06-07: qty 2

## 2019-06-07 MED ORDER — PROPOFOL 10 MG/ML IV BOLUS
INTRAVENOUS | Status: AC
  Filled 2019-06-07: qty 20

## 2019-06-07 MED ORDER — ONDANSETRON HCL 4 MG/2ML IJ SOLN
INTRAMUSCULAR | Status: DC | PRN
  Administered 2019-06-07: 4 mg via INTRAVENOUS

## 2019-06-07 MED ORDER — FENTANYL CITRATE (PF) 100 MCG/2ML IJ SOLN
INTRAMUSCULAR | Status: AC
  Filled 2019-06-07: qty 2

## 2019-06-07 MED ORDER — SODIUM CHLORIDE 0.9 % IV SOLN
INTRAVENOUS | Status: DC
  Administered 2019-06-07: 15:00:00 via INTRAVENOUS

## 2019-06-07 MED ORDER — MIDAZOLAM HCL 2 MG/2ML IJ SOLN: INTRAMUSCULAR | Status: DC | PRN

## 2019-06-07 MED ORDER — SEVOFLURANE IN SOLN
RESPIRATORY_TRACT | Status: AC
  Filled 2019-06-07: qty 250

## 2019-06-07 MED ORDER — FENTANYL CITRATE (PF) 100 MCG/2ML IJ SOLN: 25.0000 ug | INTRAMUSCULAR | Status: DC | PRN

## 2019-06-07 MED ORDER — FENTANYL CITRATE (PF) 100 MCG/2ML IJ SOLN
INTRAMUSCULAR | Status: DC | PRN
  Administered 2019-06-07: 25 ug via INTRAVENOUS

## 2019-06-07 MED ORDER — CEFAZOLIN SODIUM-DEXTROSE 2-4 GM/100ML-% IV SOLN
INTRAVENOUS | Status: AC
  Filled 2019-06-07: qty 100

## 2019-06-07 MED ORDER — IOHEXOL 180 MG/ML  SOLN
INTRAMUSCULAR | Status: DC | PRN
  Administered 2019-06-07: 25 mL

## 2019-06-07 MED ORDER — LIDOCAINE HCL (CARDIAC) PF 100 MG/5ML IV SOSY
PREFILLED_SYRINGE | INTRAVENOUS | Status: DC | PRN
  Administered 2019-06-07: 100 mg via INTRAVENOUS

## 2019-06-07 MED ORDER — ONDANSETRON HCL 4 MG/2ML IJ SOLN: 4.0000 mg | Freq: Once | INTRAMUSCULAR | Status: DC | PRN

## 2019-06-07 MED ORDER — PROPOFOL 10 MG/ML IV BOLUS
INTRAVENOUS | Status: DC | PRN
  Administered 2019-06-07: 150 mg via INTRAVENOUS

## 2019-06-07 SURGICAL SUPPLY — 24 items
BAG DRAIN CYSTO-URO LG1000N (MISCELLANEOUS) ×4 IMPLANT
BRUSH SCRUB EZ  4% CHG (MISCELLANEOUS) ×2
BRUSH SCRUB EZ 4% CHG (MISCELLANEOUS) ×2 IMPLANT
CATH URETL 5X70 OPEN END (CATHETERS) ×4 IMPLANT
COVER WAND RF STERILE (DRAPES) IMPLANT
DRAPE UTILITY 15X26 TOWEL STRL (DRAPES) ×4 IMPLANT
DRSG TELFA 4X3 1S NADH ST (GAUZE/BANDAGES/DRESSINGS) ×4 IMPLANT
ELECT REM PT RETURN 9FT ADLT (ELECTROSURGICAL) ×4
ELECTRODE REM PT RTRN 9FT ADLT (ELECTROSURGICAL) ×2 IMPLANT
GLOVE BIO SURGEON STRL SZ 6.5 (GLOVE) ×3 IMPLANT
GLOVE BIO SURGEONS STRL SZ 6.5 (GLOVE) ×1
GOWN STRL REUS W/ TWL LRG LVL3 (GOWN DISPOSABLE) ×4 IMPLANT
GOWN STRL REUS W/TWL LRG LVL3 (GOWN DISPOSABLE) ×4
GUIDEWIRE STR DUAL SENSOR (WIRE) ×4 IMPLANT
KIT TURNOVER CYSTO (KITS) ×4 IMPLANT
NDL SAFETY ECLIPSE 18X1.5 (NEEDLE) ×2 IMPLANT
NEEDLE HYPO 18GX1.5 SHARP (NEEDLE) ×2
PACK CYSTO AR (MISCELLANEOUS) ×4 IMPLANT
SET CYSTO W/LG BORE CLAMP LF (SET/KITS/TRAYS/PACK) ×4 IMPLANT
SOL .9 NS 3000ML IRR  AL (IV SOLUTION)
SOL .9 NS 3000ML IRR UROMATIC (IV SOLUTION) IMPLANT
SURGILUBE 2OZ TUBE FLIPTOP (MISCELLANEOUS) ×4 IMPLANT
WATER STERILE IRR 1000ML POUR (IV SOLUTION) ×4 IMPLANT
WATER STERILE IRR 3000ML UROMA (IV SOLUTION) ×4 IMPLANT

## 2019-06-07 NOTE — Interval H&P Note (Signed)
History and Physical Interval Note:  06/07/2019 2:42 PM  Casey Adkins  has presented today for surgery, with the diagnosis of bladder erythema, history of bladder cancer.  The various methods of treatment have been discussed with the patient and family. After consideration of risks, benefits and other options for treatment, the patient has consented to  Procedure(s): CYSTOSCOPY WITH Bladder BIOPSY (N/A) CYSTOSCOPY WITH RETROGRADE PYELOGRAM (Bilateral) as a surgical intervention.  The patient's history has been reviewed, patient examined, no change in status, stable for surgery.  I have reviewed the patient's chart and labs.  Questions were answered to the patient's satisfaction.    RRR CTAB  Hollice Espy

## 2019-06-07 NOTE — Transfer of Care (Signed)
Immediate Anesthesia Transfer of Care Note  Patient: Casey Adkins  Procedure(s) Performed: CYSTOSCOPY WITH Bladder BIOPSY (N/A Bladder) CYSTOSCOPY WITH RETROGRADE PYELOGRAM (Bilateral Bladder)  Patient Location: PACU  Anesthesia Type:General  Level of Consciousness: awake, alert  and oriented  Airway & Oxygen Therapy: Patient Spontanous Breathing and Patient connected to nasal cannula oxygen  Post-op Assessment: Report given to RN and Post -op Vital signs reviewed and stable  Post vital signs: Reviewed and stable  Last Vitals:  Vitals Value Taken Time  BP    Temp    Pulse    Resp    SpO2      Last Pain:  Vitals:   06/07/19 1530  TempSrc:   PainSc: Asleep         Complications: No apparent anesthesia complications

## 2019-06-07 NOTE — Anesthesia Preprocedure Evaluation (Signed)
Anesthesia Evaluation  Patient identified by MRN, date of birth, ID band Patient awake    Reviewed: Allergy & Precautions, NPO status , Patient's Chart, lab work & pertinent test results  History of Anesthesia Complications Negative for: history of anesthetic complications  Airway Mallampati: III  TM Distance: >3 FB Neck ROM: Full    Dental  (+) Poor Dentition   Pulmonary neg sleep apnea, neg COPD, former smoker,    breath sounds clear to auscultation- rhonchi (-) wheezing      Cardiovascular hypertension, Pt. on medications (-) CAD, (-) Past MI, (-) Cardiac Stents and (-) CABG  Rhythm:Regular Rate:Normal - Systolic murmurs and - Diastolic murmurs    Neuro/Psych neg Seizures CVA, No Residual Symptoms negative psych ROS   GI/Hepatic Neg liver ROS, GERD  ,  Endo/Other  diabetes, Oral Hypoglycemic Agents  Renal/GU Renal disease: hx of nephrolithiasis.     Musculoskeletal  (+) Arthritis ,   Abdominal (+) + obese,   Peds  Hematology negative hematology ROS (+)   Anesthesia Other Findings Past Medical History: No date: Allergic rhinitis No date: Arthritis No date: Cancer [facility])     Comment:  Bladder Cancer No date: Cancer [facility])     Comment:  Prostate Cancer No date: Diabetes mellitus without complication (HCC) No date: Diverticulosis No date: Dysphagia No date: GERD (gastroesophageal reflux disease) No date: History of kidney stones     Comment:  h/o No date: Hypertension 2006: Stroke [facility])     Comment:  possible light stroke-no deficits   Reproductive/Obstetrics                             Anesthesia Physical Anesthesia Plan  ASA: III  Anesthesia Plan: General   Post-op Pain Management:    Induction: Intravenous  PONV Risk Score and Plan: 1 and Ondansetron and Dexamethasone  Airway Management Planned: LMA  Additional Equipment:   Intra-op Plan:   Post-operative Plan:    Informed Consent: I have reviewed the patients History and Physical, chart, labs and discussed the procedure including the risks, benefits and alternatives for the proposed anesthesia with the patient or authorized representative who has indicated his/her understanding and acceptance.     Dental advisory given  Plan Discussed with: CRNA and Anesthesiologist  Anesthesia Plan Comments:         Anesthesia Quick Evaluation

## 2019-06-07 NOTE — Discharge Instructions (Signed)
Transurethral Resection of Bladder Tumor (TURBT) or Bladder Biopsy ° ° °Definition: ° Transurethral Resection of the Bladder Tumor is a surgical procedure used to diagnose and remove tumors within the bladder. TURBT is the most common treatment for early stage bladder cancer. ° °General instructions: °   ° Your recent bladder surgery requires very little post hospital care but some definite precautions. ° °Despite the fact that no skin incisions were used, the area around the bladder incisions are raw and covered with scabs to promote healing and prevent bleeding. Certain precautions are needed to insure that the scabs are not disturbed over the next 2-4 weeks while the healing proceeds. ° °Because the raw surface inside your bladder and the irritating effects of urine you may expect frequency of urination and/or urgency (a stronger desire to urinate) and perhaps even getting up at night more often. This will usually resolve or improve slowly over the healing period. You may see some blood in your urine over the first 6 weeks. Do not be alarmed, even if the urine was clear for a while. Get off your feet and drink lots of fluids until clearing occurs. If you start to pass clots or don't improve call us. ° °Diet: ° °You may return to your normal diet immediately. Because of the raw surface of your bladder, alcohol, spicy foods, foods high in acid and drinks with caffeine may cause irritation or frequency and should be used in moderation. To keep your urine flowing freely and avoid constipation, drink plenty of fluids during the day (8-10 glasses). Tip: Avoid cranberry juice because it is very acidic. ° °Activity: ° °Your physical activity doesn't need to be restricted. However, if you are very active, you may see some blood in the urine. We suggest that you reduce your activity under the circumstances until the bleeding has stopped. ° °Bowels: ° °It is important to keep your bowels regular during the postoperative  period. Straining with bowel movements can cause bleeding. A bowel movement every other day is reasonable. Use a mild laxative if needed, such as milk of magnesia 2-3 tablespoons, or 2 Dulcolax tablets. Call if you continue to have problems. If you had been taking narcotics for pain, before, during or after your surgery, you may be constipated. Take a laxative if necessary. ° ° ° °Medication: ° °You should resume your pre-surgery medications unless told not to. In addition you may be given an antibiotic to prevent or treat infection. Antibiotics are not always necessary. All medication should be taken as prescribed until the bottles are finished unless you are having an unusual reaction to one of the drugs. ° ° °[facility] °[place], [place] [address] °[phone] ° ° ° °AMBULATORY SURGERY  °DISCHARGE INSTRUCTIONS ° ° °1) The drugs that you were given will stay in your system until tomorrow so for the next 24 hours you should not: ° °A) Drive an automobile °B) Make any legal decisions °C) Drink any alcoholic beverage ° ° °2) You may resume regular meals tomorrow.  Today it is better to start with liquids and gradually work up to solid foods. ° °You may eat anything you prefer, but it is better to start with liquids, then soup and crackers, and gradually work up to solid foods. ° ° °3) Please notify your doctor immediately if you have any unusual bleeding, trouble breathing, redness and pain at the surgery site, drainage, fever, or pain not relieved by medication. ° ° ° °4) Additional Instructions: ° ° ° ° ° ° ° °  Please contact your physician with any problems or Same Day Surgery at [phone], Monday through Friday 6 am to 4 pm, or [place] [facility] [place] Main number at [phone]. ° °

## 2019-06-07 NOTE — Anesthesia Post-op Follow-up Note (Signed)
Anesthesia QCDR form completed.        

## 2019-06-07 NOTE — Anesthesia Postprocedure Evaluation (Signed)
Anesthesia Post Note  Patient: Casey Adkins  Procedure(s) Performed: CYSTOSCOPY WITH Bladder BIOPSY (N/A Bladder) CYSTOSCOPY WITH RETROGRADE PYELOGRAM (Bilateral Bladder)  Patient location during evaluation: PACU Anesthesia Type: General Level of consciousness: awake and alert Pain management: pain level controlled Vital Signs Assessment: post-procedure vital signs reviewed and stable Respiratory status: spontaneous breathing, nonlabored ventilation, respiratory function stable and patient connected to nasal cannula oxygen Cardiovascular status: blood pressure returned to baseline and stable Postop Assessment: no apparent nausea or vomiting Anesthetic complications: no     Last Vitals:  Vitals:   06/07/19 1614 06/07/19 1644  BP: (!) 155/80 (!) 154/75  Pulse: 67 80  Resp: 18 18  Temp: (!) 36.2 C   SpO2: 94% 95%    Last Pain:  Vitals:   06/07/19 1644  TempSrc:   PainSc: 0-No pain                 Yajahira Tison S

## 2019-06-07 NOTE — Op Note (Signed)
Date of procedure: 06/07/19  Preoperative diagnosis:  1. History of bladder cancer/CIS 2. Bladder erythema  Postoperative diagnosis:  1. Same as above  Procedure: 1. Cystoscopy 2. Bladder biopsy 3. Bilateral retrograde pyelogram  Surgeon: Hollice Espy, MD  Anesthesia: General  Complications: None  Intraoperative findings: Unremarkable bilateral retrograde pyelogram.  Velvety erythematous lesion measuring approximately 1 cm on left lateral bladder wall biopsy.  EBL: Normal  Specimens: Bladder biopsy, left lateral bladder wall  Drains: None  Indication: Casey Adkins is a 85 y.o. patient with history of bladder cancer/CIS status post multiple rounds of BCG with persistent left bladder wall erythema concerning for possible CIS versus focal cystitis.  Notably, cytology is negative..  After reviewing the management options for treatment, he elected to proceed with the above surgical procedure(s). We have discussed the potential benefits and risks of the procedure, side effects of the proposed treatment, the likelihood of the patient achieving the goals of the procedure, and any potential problems that might occur during the procedure or recuperation. Informed consent has been obtained.  Description of procedure:  The patient was taken to the operating room and general anesthesia was induced.  The patient was placed in the dorsal lithotomy position, prepped and draped in the usual sterile fashion, and preoperative antibiotics were administered. A preoperative time-out was performed.   A 21 French scope was advanced per urethra into the bladder.  The bladder is carefully inspected and noted to be mildly trabeculated with hypertrophy of the prostate.  There is 1 focal area of bladder erythema on the left lateral posterior bladder wall which is velvety and ill-defined concerning for focal cystitis versus CIS.  The remainder the bladder was unremarkable without stones or ulcerations.   Attention was then turned to the right ureteral orifice which was cannulated using a 5 [place] open-ended ureteral catheter.  Additional retrograde pyelogram on the side was performed which showed no hydroureteronephrosis or filling defects.  The same exact procedure was from the left side which is also unremarkable.  This point in time, cold cup biopsy forceps were used to biopsy the erythematous patch x5.  These were passed off the field as bladder wall biopsies.  This and fulgurated using Bugbee electrocautery to fulgurate the entire area which appeared to be suspicious.  Hemostasis was adequate.  The bladder was drained.  The patient was then cleaned and dried, repositioned the supine position, version anesthesia, taken the PACU in stable condition.  Plan: I will call the patient with his pathology results.  We will plan to scope him in about 3 months.  I called his wife to let her know the plan.  Hollice Espy, M.D.

## 2019-06-08 ENCOUNTER — Encounter: Payer: Self-pay | Admitting: Urology

## 2019-06-10 ENCOUNTER — Telehealth: Payer: Self-pay | Admitting: *Deleted

## 2019-06-10 LAB — SURGICAL PATHOLOGY

## 2019-06-10 NOTE — Telephone Encounter (Addendum)
Notified patient-verbalized understanding.   ----- Message from Hollice Espy, MD sent at 06/10/2019 10:04 AM EDT ----- Please let this patient know that I biopsied a small area in his bladder and this was consistent with cystitis, not cancer.  Great news.  Hollice Espy, MD

## 2019-09-07 ENCOUNTER — Encounter: Payer: Self-pay | Admitting: Urology

## 2019-09-07 ENCOUNTER — Ambulatory Visit (INDEPENDENT_AMBULATORY_CARE_PROVIDER_SITE_OTHER): Payer: Medicare Other | Admitting: Urology

## 2019-09-07 ENCOUNTER — Other Ambulatory Visit: Payer: Self-pay

## 2019-09-07 VITALS — BP 142/78 | HR 82 | Ht 70.0 in | Wt 211.0 lb

## 2019-09-07 DIAGNOSIS — Z8551 Personal history of malignant neoplasm of bladder: Secondary | ICD-10-CM

## 2019-09-07 LAB — URINALYSIS, COMPLETE
Bilirubin, UA: NEGATIVE
Leukocytes,UA: NEGATIVE
Nitrite, UA: NEGATIVE
Protein,UA: NEGATIVE
Specific Gravity, UA: 1.02 (ref 1.005–1.030)
Urobilinogen, Ur: 0.2 mg/dL (ref 0.2–1.0)
pH, UA: 5.5 (ref 5.0–7.5)

## 2019-09-07 LAB — MICROSCOPIC EXAMINATION: Bacteria, UA: NONE SEEN

## 2019-09-07 NOTE — Progress Notes (Signed)
   09/07/19  CC:  Chief Complaint  Patient presents with  . Cysto    HPI: 85 year old male with a history of bladder cancer who returns today for surveillance cystoscopy.  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  Most recent recurrence of CIS diagnosed on office biopsy on 09/24/2016. He underwent follow-up fulguration of this lesion in the office on 11/20/2016 with mitomycin.    He has since started BCG and has had 2 instillations with Dr. Jacqlyn Larsen. This course was started on 12/04/2016 and a second dose was administered on 12/18/2016 then transferred his care to [facility] and complete the course which finished on 01/22/17.    He is since completed maintenance BCG in 04/2017 and 08/2017.  Urine cytology has been negative to date in the office..    Recent return to OR on 06/07/2019 for biopsy of erythematous patch.  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  No dysplasia was appreciated.  Bilateral retrograde pyelogram was unremarkable.  Blood pressure (!) 161/79, pulse 66, height 5\' 10"  (1.778 m), weight 213 lb 9.6 oz (96.9 kg). NED. A&Ox3.   No respiratory distress   Abd soft, NT, ND Normal phallus with bilateral descended testicles  Cystoscopy Procedure Note  Patient identification was confirmed, informed consent was obtained, and patient was prepped using Betadine solution.  Lidocaine jelly was administered per urethral meatus.    Preoperative abx where received prior to procedure.     Pre-Procedure: - Inspection reveals a normal caliber ureteral meatus.  Procedure: The flexible cystoscope was introduced with small amount of narrowing at fossa navicularis, able to advance 16 Fr  - No urethral strictures/lesions are present. - Enlarged prostate  - Normal bladder neck - Bilateral ureteral orifices  identified - Bladder mucosa   - Moderate trabeculation particularly at dome  Retroflexion unremarkable other than some mild hypervascularity around the bladder neck.  Post-Procedure: - Patient tolerated the procedure well  Assessment/ Plan:  1. History of CIS (carcinoma in situ of bladder) NED today  Patchy erythema on the left lateral bladder wall resolved following fulguration, no malignancy or dysplasia on biopsy specimen which is reassuring Cysto 6 months given lack of recurrent since 2017  2. History of kidney stones Currently asymptomatic  3. Benign prostatic hyperplasia with urinary frequency Continue Flomax/finasteride  Return in about 6 months (around 03/07/2020) for cysto.  Hollice Espy, MD

## 2019-10-02 ENCOUNTER — Other Ambulatory Visit: Payer: Self-pay | Admitting: Urology

## 2019-10-08 ENCOUNTER — Other Ambulatory Visit: Payer: Self-pay | Admitting: Urology

## 2019-10-08 DIAGNOSIS — N138 Other obstructive and reflux uropathy: Secondary | ICD-10-CM

## 2019-10-08 DIAGNOSIS — N401 Enlarged prostate with lower urinary tract symptoms: Secondary | ICD-10-CM

## 2020-01-19 ENCOUNTER — Ambulatory Visit: Payer: Medicare Other

## 2020-02-01 ENCOUNTER — Other Ambulatory Visit: Payer: Self-pay | Admitting: Urology

## 2020-03-06 NOTE — Progress Notes (Signed)
   03/07/20  CC:  Chief Complaint  Patient presents with  . Bladder Cancer    HPI: 86 year old male with a history of bladder cancer who returns today for surveillance cystoscopy.  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  Most recent recurrence of CIS diagnosed on office biopsy on 09/24/2016. He underwent follow-up fulguration of this lesion in the office on 11/20/2016 with mitomycin.   He has since started BCG and has had 2 instillations with Dr. Jacqlyn Larsen.This course was started on 12/04/2016 and a second dose was administered on 12/18/2016 then transferred his care to [facility] and complete the course which finished on 01/22/17.    He is since completed maintenance BCG in 04/2017 and 08/2017.  Urine cytology has been negative to date in the office..    Returned to OR on 06/07/2019 for biopsy of erythematous patch.  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  No dysplasia was appreciated.  Bilateral retrograde pyelogram was unremarkable.  Pt hopes to discontinue one of his medications.   He has been taking Mg prescribed by Dr. Jacqlyn Larsen for his history of kidney stones.   Vitals:   03/07/20 0953  BP: (!) 160/90  Pulse: (!) 54   NED. A&Ox3.   No respiratory distress   Abd soft, NT, ND Normal phallus with bilateral descended testicles  Cystoscopy Procedure Note  Patient identification was confirmed, informed consent was obtained, and patient was prepped using Betadine solution.  Lidocaine jelly was administered per urethral meatus.     Pre-Procedure: - Inspection reveals a normal caliber ureteral meatus.  Procedure: The flexible cystoscope was introduced without difficulty - No urethral strictures/lesions are present. - Enlarged prostate  - Normal bladder neck - Bilateral ureteral orifices  identified - Bladder mucosa  reveals no ulcers, tumors, or lesions - Stellate scars on left lateral bladder wall  - No bladder stones - Mild trabeculation  Retroflexion shows unremarkable other than some mild hypervascularity around the bladder neck.  Post-Procedure: - Patient tolerated the procedure well  Assessment/ Plan:  1. History of CIS (carcinoma in situ of bladder) NED today .a Cysto 6 months given lack of recurrent since 2017  2. History of kidney stones Currently asymptomatic Mg prescribed by Dr. Jacqlyn Larsen, interested in stopping this.  3. Benign prostatic hyperplasia with urinary frequency Currently on finasteride/flomax  Given pt's wish to discontinue medication, advised him to stop Flomax for a week to see if symptoms worsen and if so, restart medication.  Will discuss outlet procedures at next visit if he wishes to discontinue medications altogether   Return in about 6 months (around 09/06/2020) for Cysto.  Jamas Lav, am acting as a scribe for Dr. Hollice Espy,  I have reviewed the above documentation for accuracy and completeness, and I agree with the above.   Hollice Espy, MD

## 2020-03-07 ENCOUNTER — Ambulatory Visit: Payer: Medicare Other | Admitting: Urology

## 2020-03-07 ENCOUNTER — Other Ambulatory Visit: Payer: Self-pay

## 2020-03-07 VITALS — BP 160/90 | HR 54 | Ht 70.0 in | Wt 200.0 lb

## 2020-03-07 DIAGNOSIS — Z8551 Personal history of malignant neoplasm of bladder: Secondary | ICD-10-CM

## 2020-03-07 LAB — URINALYSIS, COMPLETE
Bilirubin, UA: NEGATIVE
Ketones, UA: NEGATIVE
Leukocytes,UA: NEGATIVE
Nitrite, UA: NEGATIVE
Protein,UA: NEGATIVE
RBC, UA: NEGATIVE
Specific Gravity, UA: 1.02 (ref 1.005–1.030)
Urobilinogen, Ur: 0.2 mg/dL (ref 0.2–1.0)
pH, UA: 6 (ref 5.0–7.5)

## 2020-03-07 LAB — MICROSCOPIC EXAMINATION: Bacteria, UA: NONE SEEN

## 2020-09-02 ENCOUNTER — Other Ambulatory Visit: Payer: Self-pay | Admitting: Urology

## 2020-09-05 NOTE — Progress Notes (Signed)
   09/06/2020  CC:  Chief Complaint  Patient presents with  . Cysto    HPI: Casey Adkins is a 86 y.o. male who returns for a 6 month surveillance cystoscopy.   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  Most recent recurrence of CIS diagnosed on office biopsy on 09/24/2016. He underwent follow-up fulguration of this lesion in the office on 11/20/2016 with mitomycin.   He has since started BCG and has had 2 instillations with Dr. Jacqlyn Larsen.This course was started on 12/04/2016 and a second dose was administered on 12/18/2016 then transferred his care to [facility] and complete the course which finished on 01/22/17.    Patient completed maintenance BCG in 04/2017 and 08/2017.  Urine cytology has been negative to date in the office..    Recent return to OR on 06/07/2019 for biopsy of erythematous patch.  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  No dysplasia was appreciated.  Bilateral retrograde pyelogram was unremarkable.  He has been taking Mg prescribed by Dr. Jacqlyn Larsen for his history of kidney stones.   Blood pressure (!) 154/97, pulse 74, weight 203 lb (92.1 kg). NED. A&Ox3.   No respiratory distress   Abd soft, NT, ND Normal phallus with bilateral descended testicles  Cystoscopy Procedure Note  Patient identification was confirmed, informed consent was obtained, and patient was prepped using Betadine solution.  Lidocaine jelly was administered per urethral meatus.     Pre-Procedure: - Inspection reveals a normal caliber ureteral meatus.  Procedure: The flexible cystoscope was introduced without difficulty - No urethral strictures/lesions are present. - Enlarged prostate  - Normal bladder neck - Bilateral ureteral orifices identified - Bladder mucosa  reveals no ulcers, tumors, or lesions -  Stellate scars on left lateral bladder wall - Mild diffuse erythema and debris consistent with inflammation.  - No bladder stones - Mild trabeculation  Retroflexion shows unremarkable other than some mild hypervascularity around the bladder neck.    Post-Procedure: - Patient tolerated the procedure well  Assessment/ Plan:  1. History of CIS (carcinoma in situ of bladder) NED UA noted 11-30 WBC. Suspect inflammation but patient is currently asymptomatic.  Cystoscopy in 6 months. -Urine cytology sent.  2. History of kidney stones Asymptomatic   3. BPH with urinary frequency  Asymptomatic   I, Selena Batten, am acting as a scribe for Dr. Hollice Espy.  I have reviewed the above documentation for accuracy and completeness, and I agree with the above.   Hollice Espy, MD

## 2020-09-06 ENCOUNTER — Other Ambulatory Visit: Payer: Self-pay | Admitting: Urology

## 2020-09-06 ENCOUNTER — Other Ambulatory Visit: Payer: Self-pay

## 2020-09-06 ENCOUNTER — Ambulatory Visit: Payer: Medicare Other | Admitting: Urology

## 2020-09-06 VITALS — BP 154/97 | HR 74 | Wt 203.0 lb

## 2020-09-06 DIAGNOSIS — Z8551 Personal history of malignant neoplasm of bladder: Secondary | ICD-10-CM | POA: Diagnosis not present

## 2020-09-06 LAB — MICROSCOPIC EXAMINATION: Bacteria, UA: NONE SEEN

## 2020-09-06 LAB — URINALYSIS, COMPLETE
Bilirubin, UA: NEGATIVE
Leukocytes,UA: NEGATIVE
Nitrite, UA: NEGATIVE
Protein,UA: NEGATIVE
Specific Gravity, UA: 1.02 (ref 1.005–1.030)
Urobilinogen, Ur: 0.2 mg/dL (ref 0.2–1.0)
pH, UA: 5 (ref 5.0–7.5)

## 2020-09-07 LAB — CYTOLOGY - NON PAP

## 2020-09-09 LAB — CULTURE, URINE COMPREHENSIVE

## 2020-09-11 ENCOUNTER — Telehealth: Payer: Self-pay

## 2020-09-11 NOTE — Telephone Encounter (Signed)
sw patients wife . He is not having any symptoms at this time. They both verbalized understanding .

## 2020-09-11 NOTE — Telephone Encounter (Signed)
-----   Message from Hollice Espy, MD sent at 09/10/2020  1:54 PM EDT ----- I sent a urine culture because his urine was relatively cloudy at the time of cystoscopy.  I do not believe he was having symptoms.  If he is not having symptoms, there is no need to treat.  Please check with him and ensure that that indeed is the case.  If he is having increased burning urgency frequency or any other UTI specific symptoms, lets treat him with Keflex 500 mg 3 times a day for 7 days.  Hollice Espy, MD

## 2020-11-14 ENCOUNTER — Other Ambulatory Visit: Payer: Self-pay | Admitting: Urology

## 2020-12-11 ENCOUNTER — Other Ambulatory Visit: Payer: Self-pay | Admitting: Urology

## 2020-12-11 DIAGNOSIS — N401 Enlarged prostate with lower urinary tract symptoms: Secondary | ICD-10-CM

## 2020-12-11 DIAGNOSIS — N138 Other obstructive and reflux uropathy: Secondary | ICD-10-CM

## 2021-03-06 NOTE — Progress Notes (Shared)
   03/06/2021   CC: No chief complaint on file.   HPI: Casey Adkins is a 87 y.o.    There were no vitals taken for this visit. NED. A&Ox3.   No respiratory distress   Abd soft, NT, ND Normal phallus with bilateral descended testicles  Cystoscopy Procedure Note  Patient identification was confirmed, informed consent was obtained, and patient was prepped using Betadine solution.  Lidocaine jelly was administered per urethral meatus.     Pre-Procedure: - Inspection reveals a normal caliber ureteral meatus.  Procedure: The flexible cystoscope was introduced without difficulty - No urethral strictures/lesions are present. - {Blank multiple:19197::"Enlarged","Surgically absent","Normal"} prostate *** - {Blank multiple:19197::"Normal","Elevated","Tight"} bladder neck *** - Bilateral ureteral orifices identified - Bladder mucosa  reveals no ulcers, tumors, or lesions - No bladder stones - No trabeculation  Retroflexion shows ***   Post-Procedure: - Patient tolerated the procedure well  Assessment/ Plan:  1.    Follow Up: No follow-ups on file.   Casey Adkins, am acting as a scribe for Dr. Hollice Espy.    Casey Adkins

## 2021-03-07 ENCOUNTER — Other Ambulatory Visit: Payer: Self-pay

## 2021-03-07 ENCOUNTER — Ambulatory Visit: Payer: Medicare Other | Admitting: Urology

## 2021-03-07 ENCOUNTER — Encounter: Payer: Self-pay | Admitting: Urology

## 2021-03-07 VITALS — BP 149/73 | HR 80 | Ht 70.0 in | Wt 203.0 lb

## 2021-03-07 DIAGNOSIS — Z8551 Personal history of malignant neoplasm of bladder: Secondary | ICD-10-CM | POA: Diagnosis not present

## 2021-03-07 NOTE — Progress Notes (Signed)
   09/06/2020  CC:  Chief Complaint  Patient presents with  . Cysto    HPI: Casey Adkins is a 87 y.o. male who returns for a 6 month surveillance cystoscopy.   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  Most recent recurrence of CIS diagnosed on office biopsy on 09/24/2016. He underwent follow-up fulguration of this lesion in the office on 11/20/2016 with mitomycin.   He has since started BCG and has had 2 instillations with Dr. Jacqlyn Larsen.This course was started on 12/04/2016 and a second dose was administered on 12/18/2016 then transferred his care to [facility] and complete the course which finished on 01/22/17.    Patient completed maintenance BCG in 04/2017 and 08/2017.  Urine cytology has been negative to date in the office..    Recent return to OR on 06/07/2019 for biopsy of erythematous patch.  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  No dysplasia was appreciated.  Bilateral retrograde pyelogram was unremarkable.  He has been taking Mg prescribed by Dr. Jacqlyn Larsen for his history of kidney stones.   Blood pressure (!) 149/73, pulse 80, height 5\' 10"  (1.778 m), weight 203 lb (92.1 kg). NED. A&Ox3.   No respiratory distress   Abd soft, NT, ND Normal phallus with bilateral descended testicles  Cystoscopy Procedure Note  Patient identification was confirmed, informed consent was obtained, and patient was prepped using Betadine solution.  Lidocaine jelly was administered per urethral meatus.     Pre-Procedure: - Inspection reveals a normal caliber ureteral meatus.  Procedure: The flexible cystoscope was introduced without difficulty - No urethral strictures/lesions are present. - Enlarged prostate  - Normal bladder neck - Bilateral ureteral orifices identified - Bladder mucosa  reveals no ulcers,  tumors, or lesions - Stellate scars on left lateral bladder wall - No bladder stones - Mild trabeculation  Retroflexion shows unremarkable other than some mild hypervascularity around the bladder neck.    Post-Procedure: - Patient tolerated the procedure well  Assessment/ Plan:  1. History of CIS (carcinoma in situ of bladder) NED. Cystoscopy in 6 months, may consider transition to annual after next cystoscopy if remains without evidence of recurrence -Urine cytology sent.  2. History of kidney stones Asymptomatic   3. BPH with urinary frequency  Asymptomatic     Hollice Espy, MD

## 2021-03-08 LAB — CYTOLOGY - NON PAP

## 2021-03-08 LAB — URINALYSIS, COMPLETE
Bilirubin, UA: NEGATIVE
Leukocytes,UA: NEGATIVE
Nitrite, UA: NEGATIVE
Protein,UA: NEGATIVE
Specific Gravity, UA: >1.03 — ABNORMAL HIGH (ref 1.005–1.030)
Urobilinogen, Ur: 0.2 mg/dL (ref 0.2–1.0)
pH, UA: 5 (ref 5.0–7.5)

## 2021-03-08 LAB — MICROSCOPIC EXAMINATION: Bacteria, UA: NONE SEEN

## 2021-04-24 ENCOUNTER — Ambulatory Visit: Payer: Medicare Other | Admitting: Urology

## 2021-04-24 ENCOUNTER — Encounter: Payer: Self-pay | Admitting: Urology

## 2021-04-24 ENCOUNTER — Other Ambulatory Visit: Payer: Self-pay

## 2021-04-24 VITALS — BP 147/80 | HR 84 | Ht 70.0 in | Wt 209.0 lb

## 2021-04-24 DIAGNOSIS — R972 Elevated prostate specific antigen [PSA]: Secondary | ICD-10-CM

## 2021-04-24 DIAGNOSIS — Z8551 Personal history of malignant neoplasm of bladder: Secondary | ICD-10-CM

## 2021-04-24 DIAGNOSIS — R3915 Urgency of urination: Secondary | ICD-10-CM

## 2021-04-24 NOTE — Progress Notes (Signed)
04/24/2021 11:48 AM   Casey Adkins 10-17-1934 [id number]  Referring provider: Rusty Aus, MD [facility] [place],  [place] [address]  Chief Complaint  Patient presents with  . Elevated PSA    HPI: 87 year old male with personal history of bladder cancer status post recent cystoscopy who returns today for abnormal PSA.  He has PSA drawn as part of his normal routine labs.  This indicated a PSA of 5.01.  This was on 04/02/2021.  It was previously 3.84 on 11 2021-2.45 on 08/2019, and 1.80 back in 2015.  He also mentions today that he has been having increased worsening urinary urgency.  He only gets up 1 time at night to void.  He denies any specific frequency but when he has the urge, he is to go to the bathroom almost immediately.  He occasionally has accidents.  This is bothersome to him.  He is currently on Flomax/finasteride for BPH.  PMH: Past Medical History:  Diagnosis Date  . Allergic rhinitis   . Arthritis   . Cancer [facility])    Bladder Cancer  . Cancer [facility]    Prostate Cancer  . Diabetes mellitus without complication ([place])   . Diverticulosis   . Dysphagia   . GERD (gastroesophageal reflux disease)   . History of kidney stones    h/o  . Hypertension   . Stroke [facility]) 2006   possible light stroke-no deficits    Surgical History: Past Surgical History:  Procedure Laterality Date  . CYSTOSCOPY W/ RETROGRADES Bilateral 06/07/2019   Procedure: CYSTOSCOPY WITH RETROGRADE PYELOGRAM;  Surgeon: Hollice Espy, MD;  Location: [facility] ORS;  Service: Urology;  Laterality: Bilateral;  . CYSTOSCOPY WITH BIOPSY N/A 06/07/2019   Procedure: CYSTOSCOPY WITH Bladder BIOPSY;  Surgeon: Hollice Espy, MD;  Location: [facility] ORS;  Service: Urology;  Laterality: N/A;  . ESOPHAGOGASTRODUODENOSCOPY N/A 04/21/2015   Procedure: ESOPHAGOGASTRODUODENOSCOPY (EGD);  Surgeon: Hulen Luster, MD;  Location: [facility] ENDOSCOPY;  Service: Gastroenterology;   Laterality: N/A;  . LITHOTRIPSY    . SINUS SURGERY WITH INSTATRAK  2008  . 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 TONSILLECTOMY      Home Medications:  Allergies as of 04/24/2021      Reactions   Sulfa Antibiotics Anaphylaxis   Tongue swelling.   Elemental Sulfur Swelling   In throat and tongue    Prilosec [omeprazole]    GI sxs      Medication List       Accurate as of Apr 24, 2021 11:48 AM. If you have any questions, ask your nurse or doctor.        acetaminophen 500 MG tablet Commonly known as: TYLENOL Take 500 mg by mouth every 6 (six) hours as needed for moderate pain.   aspirin EC 81 MG tablet Take 81 mg by mouth daily.   Calcium-D 600-400 MG-UNIT Tabs Take 1 tablet by mouth daily.   finasteride 5 MG tablet Commonly known as: PROSCAR TAKE 1 TABLET BY MOUTH EVERY DAY   glimepiride 4 MG tablet Commonly known as: AMARYL Take 1 tablet by mouth daily before breakfast.   lisinopril 10 MG tablet Commonly known as: ZESTRIL Take 10 mg by mouth 2 (two) times daily.   magnesium gluconate 500 MG tablet Commonly known as: MAGONATE Take 500 mg by mouth daily.   PRESERVISION AREDS PO Take 1 tablet by mouth 2 (two) times  daily.   propranolol 20 MG tablet Commonly known as: INDERAL Take 20 mg by mouth 2 (two) times daily.   tamsulosin 0.4 MG Caps capsule Commonly known as: FLOMAX TAKE 1 CAPSULE BY MOUTH EVERY DAY   Trulicity 1.5 OI/3.7CW Sopn Generic drug: Dulaglutide SMARTSIG:0.5 Milliliter(s) SUB-Q Once a Week What changed: Another medication with the same name was removed. Continue taking this medication, and follow the directions you see here. Changed by: Hollice Espy, MD       Allergies:  Allergies  Allergen Reactions  . Sulfa Antibiotics Anaphylaxis    Tongue swelling.  . Elemental Sulfur Swelling    In throat and tongue   . Prilosec [Omeprazole]     GI sxs    Family History: Family  History  Problem Relation Age of Onset  . Prostate cancer Neg Hx   . Bladder Cancer Neg Hx   . Kidney cancer Neg Hx     Social History:  reports that he quit smoking about 34 years ago. His smoking use included cigarettes. He has a 60.00 pack-year smoking history. He has never used smokeless tobacco. He reports that he does not drink alcohol and does not use drugs.   Physical Exam: BP (!) 147/80   Pulse 84   Ht 5\' 10"  (1.778 m)   Wt 209 lb (94.8 kg)   BMI 29.99 kg/m   Constitutional:  Alert and oriented, No acute distress. HEENT: [place] AT, moist mucus membranes.  Trachea midline, no masses. Cardiovascular: No clubbing, cyanosis, or edema. Respiratory: Normal respiratory effort, no increased work of breathing. Rectal: Normal sphincter tone.  40 cc prostate, nontender, no obvious nodules Skin: No rashes, bruises or suspicious lesions. Neurologic: Grossly intact, no focal deficits, moving all 4 extremities. Psychiatric: Normal mood and affect.  Laboratory Data: PSA trend as above  Urinalysis    Component Value Date/Time   APPEARANCEUR Clear 03/07/2021 0859   GLUCOSEU 1+ (A) 03/07/2021 0859   BILIRUBINUR Negative 03/07/2021 0859   PROTEINUR Negative 03/07/2021 0859   NITRITE Negative 03/07/2021 0859   LEUKOCYTESUR Negative 03/07/2021 0859    Lab Results  Component Value Date   LABMICR See below: 03/07/2021   WBCUA 0-5 03/07/2021   RBCUA None seen 09/16/2018   LABEPIT 0-10 03/07/2021   MUCUS Present (A) 06/10/2018   BACTERIA None seen 03/07/2021    Assessment & Plan:    1. Elevated PSA History of elevated/rising PSA  Patiently has been an upward trend however given his age and comorbidities, PSA screening is actually contraindicated in this particular patient  At this point time, like to continue to trend it, repeat PSA prior to his next cystoscopy as his last PSA was done just following cystoscopy  His PSA continues to trend upwards, consider biopsy although we  will have to have an extensive shared decision-making about the risk and benefits of this.  Rectal exam today was reassuring  2. Urinary urgency Long standing history of urinary urgency/frequency, discussed behavioral modification today at length  We also discussed trial of Myrbetriq 25 mg, he was given 1 month of samples to try today to see if this improves his urgency, let us know - PSA; Future  3. History of bladder cancer Continue surveillance cystoscopy in October as scheduled   Return for keep cysto in October, PSA blood draw prior.  Hollice Espy, MD  [facility] [address] [place], [place] [address] [phone]

## 2021-05-18 ENCOUNTER — Telehealth: Payer: Self-pay

## 2021-05-18 DIAGNOSIS — R3915 Urgency of urination: Secondary | ICD-10-CM

## 2021-05-18 MED ORDER — MIRABEGRON ER 25 MG PO TB24: 25.0000 mg | ORAL_TABLET | Freq: Every day | ORAL | 11 refills | Status: DC

## 2021-05-18 NOTE — Telephone Encounter (Signed)
Incoming call from pt on triage line who states he was given samples of Myrbetriq which has worked well for him. He requests an RX. RX sent. Pharmacy confirmed.

## 2021-05-29 ENCOUNTER — Other Ambulatory Visit: Payer: Self-pay | Admitting: Urology

## 2021-06-11 ENCOUNTER — Telehealth: Payer: Self-pay

## 2021-06-11 NOTE — Telephone Encounter (Signed)
Incoming call from pt who states he is unable to pay for Myrbetriq. He questions if he would be a good candidate for Vesicare or Oxybutynin. Please advise.

## 2021-06-11 NOTE — Telephone Encounter (Signed)
Left patient a VM to return call  Did the myrbetric help him? Paragraph I do not like together oxybutynin or Vesicare in patients who are in their 65 Do the concern for cognitive issues and confusion.. Perhaps see if gemtesa is less expensive?  Trospium?   Hollice Espy

## 2021-06-12 ENCOUNTER — Telehealth: Payer: Self-pay | Admitting: Family Medicine

## 2021-06-12 NOTE — Telephone Encounter (Signed)
Error

## 2021-06-12 NOTE — Telephone Encounter (Signed)
Patient returned call and I explained the message, he states he needs to have his wife return call. He was having a  hard time understanding the conversation.

## 2021-06-13 ENCOUNTER — Telehealth: Payer: Self-pay

## 2021-06-13 NOTE — Telephone Encounter (Signed)
Incoming voicemail on triage line in regards to medication. Patient would like trospium sent to [facility] [place].

## 2021-06-14 ENCOUNTER — Other Ambulatory Visit: Payer: Self-pay | Admitting: Urology

## 2021-06-14 MED ORDER — TROSPIUM CHLORIDE 20 MG PO TABS: 20.0000 mg | ORAL_TABLET | Freq: Two times a day (BID) | ORAL | 2 refills | Status: DC

## 2021-06-14 NOTE — Telephone Encounter (Signed)
Sent in Trospium to pharmacy in [place]

## 2021-09-05 ENCOUNTER — Other Ambulatory Visit: Payer: Self-pay | Admitting: Urology

## 2021-09-06 ENCOUNTER — Other Ambulatory Visit: Payer: Self-pay | Admitting: Urology

## 2021-09-10 ENCOUNTER — Other Ambulatory Visit: Payer: Self-pay

## 2021-09-11 NOTE — Progress Notes (Signed)
   09/12/2021  CC:  Chief Complaint  Patient presents with   Cysto     HPI: Casey Adkins is a 87 y.o. male with a personal history of bladder cancer, elevated PSA, and urinary urgency, who presents today for cystoscopy.   His most recent PSA on 04/02/2021 was 5.01.   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   He has since started BCG and has had 2 instillations with Dr. Jacqlyn Larsen. This course was started on 12/04/2016 and a second dose was administered on 12/18/2016 then transferred his care to [facility] and complete the course which finished on 01/22/17. He completed maintenance BCG in 04/2017 and 08/2017.   Recent return to OR on 06/07/2019 for biopsy of erythematous patch.  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  He stopped taking his trospsium and Flomax due to confusion of what they were for. The trospsium was causing constipation and dry mouth. He reports that his urinary symptoms were about the same on the medicines.     Vitals:   09/12/21 0927  BP: 130/86  Pulse: 70  NED. A&Ox3.   No respiratory distress   Abd soft, NT, ND Normal phallus with bilateral descended testicles  Cystoscopy Procedure Note  Patient identification was confirmed, informed consent was obtained, and patient was prepped using Betadine solution.  Lidocaine jelly was administered per urethral meatus.     Pre-Procedure: - Inspection reveals a normal caliber ureteral meatus.  Procedure: The flexible cystoscope was introduced without difficulty - No urethral strictures/lesions are present. - Enlarged prostate  - Normal bladder neck - Bilateral ureteral orifices identified - Bladder  mucosa  reveals no ulcers, tumors, or lesions - Stellate scares on left lateral bladder wall  - No bladder stones - Mild trabeculation  Retroflexion shows unremarkable other than some mild hypervascularity arounf the bladder neck    Post-Procedure: - Patient tolerated the procedure well  Assessment/ Plan:  History of CIS (carcinoma in situ of bladder)  - NED  - Cystoscopy in 6 months  - Urine sent for cytology   History kidney stones  - Asymptomatic   BPH with urinary frequency  - No longer taking Flomax and trospsium and symptoms are stable. Will hold off on further medications unless his symptoms become bother some to reduce polypharmacy.  - Continue taking finasteride   Follow-up in 6 months for cystoscopy   I,Kailey Littlejohn,acting as a scribe for Hollice Espy, MD.,have documented all relevant documentation on the behalf of Hollice Espy, MD,as directed by  Hollice Espy, MD while in the presence of Hollice Espy, MD.  I have reviewed the above documentation for accuracy and completeness, and I agree with the above.   Hollice Espy, MD

## 2021-09-12 ENCOUNTER — Encounter: Payer: Self-pay | Admitting: Urology

## 2021-09-12 ENCOUNTER — Other Ambulatory Visit: Payer: Self-pay

## 2021-09-12 ENCOUNTER — Ambulatory Visit: Payer: Medicare Other | Admitting: Urology

## 2021-09-12 VITALS — BP 130/86 | HR 70 | Ht 70.0 in | Wt 210.0 lb

## 2021-09-12 DIAGNOSIS — R3915 Urgency of urination: Secondary | ICD-10-CM

## 2021-09-12 DIAGNOSIS — Z8551 Personal history of malignant neoplasm of bladder: Secondary | ICD-10-CM

## 2021-09-13 ENCOUNTER — Telehealth: Payer: Self-pay | Admitting: *Deleted

## 2021-09-13 DIAGNOSIS — Z8551 Personal history of malignant neoplasm of bladder: Secondary | ICD-10-CM

## 2021-09-13 LAB — PSA: Prostate Specific Ag, Serum: 6.3 ng/mL — ABNORMAL HIGH (ref 0.0–4.0)

## 2021-09-13 LAB — CYTOLOGY - NON PAP

## 2021-09-13 NOTE — Telephone Encounter (Addendum)
Patient informed, voiced understanding.   ----- Message from Hollice Espy, MD sent at 09/13/2021  7:30 AM EDT ----- PSA still going up however at his age, would likely not intervene and would continue to follow this.  Lets repeat his PSA at the next cystoscopy to continue to trend.  Hollice Espy, MD

## 2021-10-10 ENCOUNTER — Emergency Department: Payer: Medicare Other

## 2021-10-10 ENCOUNTER — Observation Stay: Payer: Medicare Other

## 2021-10-10 ENCOUNTER — Observation Stay
Admission: EM | Admit: 2021-10-10 | Discharge: 2021-10-11 | Disposition: A | Discharge status: Home / Self Care | Payer: Medicare Other | Attending: Internal Medicine | Admitting: Internal Medicine

## 2021-10-10 ENCOUNTER — Other Ambulatory Visit: Payer: Self-pay

## 2021-10-10 DIAGNOSIS — I10 Essential (primary) hypertension: Secondary | ICD-10-CM | POA: Insufficient documentation

## 2021-10-10 DIAGNOSIS — R471 Dysarthria and anarthria: Secondary | ICD-10-CM | POA: Diagnosis not present

## 2021-10-10 DIAGNOSIS — Z79899 Other long term (current) drug therapy: Secondary | ICD-10-CM | POA: Insufficient documentation

## 2021-10-10 DIAGNOSIS — Z87891 Personal history of nicotine dependence: Secondary | ICD-10-CM | POA: Diagnosis not present

## 2021-10-10 DIAGNOSIS — Z8673 Personal history of transient ischemic attack (TIA), and cerebral infarction without residual deficits: Secondary | ICD-10-CM | POA: Diagnosis not present

## 2021-10-10 DIAGNOSIS — Z20822 Contact with and (suspected) exposure to covid-19: Secondary | ICD-10-CM | POA: Insufficient documentation

## 2021-10-10 DIAGNOSIS — Z7982 Long term (current) use of aspirin: Secondary | ICD-10-CM | POA: Insufficient documentation

## 2021-10-10 DIAGNOSIS — R1310 Dysphagia, unspecified: Secondary | ICD-10-CM | POA: Insufficient documentation

## 2021-10-10 DIAGNOSIS — E663 Overweight: Secondary | ICD-10-CM | POA: Diagnosis present

## 2021-10-10 DIAGNOSIS — E119 Type 2 diabetes mellitus without complications: Secondary | ICD-10-CM | POA: Diagnosis not present

## 2021-10-10 DIAGNOSIS — N401 Enlarged prostate with lower urinary tract symptoms: Secondary | ICD-10-CM

## 2021-10-10 DIAGNOSIS — R2990 Unspecified symptoms and signs involving the nervous system: Secondary | ICD-10-CM | POA: Diagnosis present

## 2021-10-10 DIAGNOSIS — E781 Pure hyperglyceridemia: Secondary | ICD-10-CM | POA: Diagnosis present

## 2021-10-10 DIAGNOSIS — I639 Cerebral infarction, unspecified: Principal | ICD-10-CM | POA: Insufficient documentation

## 2021-10-10 DIAGNOSIS — Z8546 Personal history of malignant neoplasm of prostate: Secondary | ICD-10-CM | POA: Insufficient documentation

## 2021-10-10 DIAGNOSIS — N138 Other obstructive and reflux uropathy: Secondary | ICD-10-CM

## 2021-10-10 DIAGNOSIS — R29810 Facial weakness: Secondary | ICD-10-CM | POA: Diagnosis present

## 2021-10-10 DIAGNOSIS — Z8551 Personal history of malignant neoplasm of bladder: Secondary | ICD-10-CM | POA: Insufficient documentation

## 2021-10-10 DIAGNOSIS — Z7984 Long term (current) use of oral hypoglycemic drugs: Secondary | ICD-10-CM | POA: Insufficient documentation

## 2021-10-10 LAB — COMPREHENSIVE METABOLIC PANEL
ALT: 20 U/L (ref 0–44)
AST: 25 U/L (ref 15–41)
Albumin: 4 g/dL (ref 3.5–5.0)
Alkaline Phosphatase: 61 U/L (ref 38–126)
Anion gap: 10 (ref 5–15)
BUN: 16 mg/dL (ref 8–23)
CO2: 26 mmol/L (ref 22–32)
Calcium: 9.2 mg/dL (ref 8.9–10.3)
Chloride: 103 mmol/L (ref 98–111)
Creatinine, Ser: 0.78 mg/dL (ref 0.61–1.24)
GFR, Estimated: >60 mL/min (ref >60)
Glucose, Bld: 316 mg/dL — ABNORMAL HIGH (ref 70–99)
Potassium: 4.6 mmol/L (ref 3.5–5.1)
Sodium: 139 mmol/L (ref 135–145)
Total Bilirubin: 0.8 mg/dL (ref 0.3–1.2)
Total Protein: 7.6 g/dL (ref 6.5–8.1)

## 2021-10-10 LAB — DIFFERENTIAL
Abs Immature Granulocytes: 0.06 10*3/uL (ref 0.00–0.07)
Basophils Absolute: 0.1 10*3/uL (ref 0.0–0.1)
Basophils Relative: 1 %
Eosinophils Absolute: 0.2 10*3/uL (ref 0.0–0.5)
Eosinophils Relative: 2 %
Immature Granulocytes: 1 %
Lymphocytes Relative: 24 %
Lymphs Abs: 1.7 10*3/uL (ref 0.7–4.0)
Monocytes Absolute: 0.4 10*3/uL (ref 0.1–1.0)
Monocytes Relative: 5 %
Neutro Abs: 4.9 10*3/uL (ref 1.7–7.7)
Neutrophils Relative %: 67 %

## 2021-10-10 LAB — CBG MONITORING, ED: Glucose-Capillary: 227 mg/dL — ABNORMAL HIGH (ref 70–99)

## 2021-10-10 LAB — PROTIME-INR
INR: 1.1 (ref 0.8–1.2)
Prothrombin Time: 13.7 seconds (ref 11.4–15.2)

## 2021-10-10 LAB — RESP PANEL BY RT-PCR (FLU A&B, COVID) ARPGX2
Influenza A by PCR: NEGATIVE
Influenza B by PCR: NEGATIVE
SARS Coronavirus 2 by RT PCR: NEGATIVE

## 2021-10-10 LAB — CBC
HCT: 44.7 % (ref 39.0–52.0)
Hemoglobin: 15.3 g/dL (ref 13.0–17.0)
MCH: 31.7 pg (ref 26.0–34.0)
MCHC: 34.2 g/dL (ref 30.0–36.0)
MCV: 92.5 fL (ref 80.0–100.0)
Platelets: 145 10*3/uL — ABNORMAL LOW (ref 150–400)
RBC: 4.83 MIL/uL (ref 4.22–5.81)
RDW: 12.8 % (ref 11.5–15.5)
WBC: 7.3 10*3/uL (ref 4.0–10.5)
nRBC: 0 % (ref 0.0–0.2)

## 2021-10-10 LAB — APTT: aPTT: 32 seconds (ref 24–36)

## 2021-10-10 LAB — GLUCOSE, CAPILLARY: Glucose-Capillary: 200 mg/dL — ABNORMAL HIGH (ref 70–99)

## 2021-10-10 MED ORDER — INSULIN ASPART 100 UNIT/ML IJ SOLN
0.0000 [IU] | Freq: Every day | INTRAMUSCULAR | Status: DC
Start: 2021-10-10 — End: 2021-10-11

## 2021-10-10 MED ORDER — FINASTERIDE 5 MG PO TABS
5.0000 mg | ORAL_TABLET | Freq: Every day | ORAL | Status: DC
  Administered 2021-10-11: 08:00:00 5 mg via ORAL
  Filled 2021-10-10: qty 1

## 2021-10-10 MED ORDER — ACETAMINOPHEN 650 MG RE SUPP: 650.0000 mg | RECTAL | Status: DC | PRN

## 2021-10-10 MED ORDER — OCUVITE-LUTEIN PO CAPS
1.0000 | ORAL_CAPSULE | Freq: Two times a day (BID) | ORAL | Status: DC
  Administered 2021-10-10: 23:00:00 1 via ORAL
  Filled 2021-10-10 (×3): qty 1

## 2021-10-10 MED ORDER — ASPIRIN EC 81 MG PO TBEC
81.0000 mg | DELAYED_RELEASE_TABLET | Freq: Every day | ORAL | Status: DC
  Administered 2021-10-11: 08:00:00 81 mg via ORAL
  Filled 2021-10-10: qty 1

## 2021-10-10 MED ORDER — CLOPIDOGREL BISULFATE 75 MG PO TABS
75.0000 mg | ORAL_TABLET | Freq: Every day | ORAL | Status: DC
  Administered 2021-10-10 – 2021-10-11 (×2): 75 mg via ORAL
  Filled 2021-10-10 (×2): qty 1

## 2021-10-10 MED ORDER — ATORVASTATIN CALCIUM 20 MG PO TABS
80.0000 mg | ORAL_TABLET | Freq: Every day | ORAL | Status: DC
  Administered 2021-10-11: 08:00:00 80 mg via ORAL
  Filled 2021-10-10: qty 4

## 2021-10-10 MED ORDER — ONDANSETRON HCL 4 MG/2ML IJ SOLN: 4.0000 mg | Freq: Three times a day (TID) | INTRAMUSCULAR | Status: DC | PRN

## 2021-10-10 MED ORDER — ATORVASTATIN CALCIUM 20 MG PO TABS: 40.0000 mg | ORAL_TABLET | Freq: Every day | ORAL | Status: DC

## 2021-10-10 MED ORDER — INSULIN ASPART 100 UNIT/ML IJ SOLN
0.0000 [IU] | Freq: Three times a day (TID) | INTRAMUSCULAR | Status: DC
  Administered 2021-10-10 – 2021-10-11 (×2): 3 [IU] via SUBCUTANEOUS
  Administered 2021-10-11: 08:00:00 2 [IU] via SUBCUTANEOUS
  Filled 2021-10-10 (×3): qty 1

## 2021-10-10 MED ORDER — ACETAMINOPHEN 160 MG/5ML PO SOLN
650.0000 mg | ORAL | Status: DC | PRN
  Filled 2021-10-10: qty 20.3

## 2021-10-10 MED ORDER — STROKE: EARLY STAGES OF RECOVERY BOOK: Freq: Once | Status: AC

## 2021-10-10 MED ORDER — ACETAMINOPHEN 325 MG PO TABS: 650.0000 mg | ORAL_TABLET | ORAL | Status: DC | PRN

## 2021-10-10 MED ORDER — ENOXAPARIN SODIUM 40 MG/0.4ML IJ SOSY
40.0000 mg | PREFILLED_SYRINGE | INTRAMUSCULAR | Status: DC
  Administered 2021-10-10: 23:00:00 40 mg via SUBCUTANEOUS
  Filled 2021-10-10: qty 0.4

## 2021-10-10 MED ORDER — CYANOCOBALAMIN 500 MCG PO TABS
500.0000 ug | ORAL_TABLET | Freq: Every day | ORAL | Status: DC
  Administered 2021-10-11: 08:00:00 500 ug via ORAL
  Filled 2021-10-10: qty 1

## 2021-10-10 MED ORDER — OYSTER SHELL CALCIUM/D3 500-5 MG-MCG PO TABS
1.0000 | ORAL_TABLET | Freq: Every day | ORAL | Status: DC
  Administered 2021-10-11: 08:00:00 1 via ORAL
  Filled 2021-10-10: qty 1

## 2021-10-10 MED ORDER — SENNOSIDES-DOCUSATE SODIUM 8.6-50 MG PO TABS: 1.0000 | ORAL_TABLET | Freq: Every evening | ORAL | Status: DC | PRN

## 2021-10-10 MED ORDER — IOHEXOL 350 MG/ML SOLN
75.0000 mL | Freq: Once | INTRAVENOUS | Status: AC | PRN
  Administered 2021-10-10: 75 mL via INTRAVENOUS

## 2021-10-10 MED ORDER — HYDRALAZINE HCL 20 MG/ML IJ SOLN: 5.0000 mg | INTRAMUSCULAR | Status: DC | PRN

## 2021-10-10 NOTE — Consult Note (Signed)
Neurology Consultation  Reason for Consult: Facial droop, slurred speech Referring Physician: Dr. Kerman Passey  CC: Facial droop, slurred speech  History is obtained from: Patient, chart, wife  HPI: Casey Adkins is a 87 y.o. 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 Last known well was somewhere around 7 PM yesterday and around 8 PM, the wife said she noticed that he was having a difficult time with his speech.  He was having slurred speech.  No difficulty with knowing what he wanted to say but when the words came out they appeared slurred.  He is extremely hard of hearing at baseline but his speech is usually fairly comprehensible and the sudden change was very noticeable to the wife. Does not report any tingling numbness or weakness. He also noted food/drinks drooling out of the right angle of the mouth. Presented to the emergency room for further evaluation.  Noncontrasted head CT was unremarkable Neurological consultation for possible stroke  Denies headache, chest pain, nausea, vomiting.  Denies shortness of breath, fevers chills.  Denies any visual changes.    LKW: 7 PM on 10/09/2021 tpa given?: no, outside the window Premorbid modified Rankin scale (mRS): 0  ROS: Full ROS was performed and is negative except as noted in the HPI.   Past Medical History:  Diagnosis Date   Allergic rhinitis    Arthritis    Cancer ([place])    Bladder Cancer   Cancer [facility])    Prostate Cancer   Diabetes mellitus without complication ([place])    Diverticulosis    Dysphagia    GERD (gastroesophageal reflux disease)    History of kidney stones    h/o   Hypertension    Stroke [facility]) 2006   possible  light stroke-no deficits    Family History  Problem Relation Age of Onset   Prostate cancer Neg Hx    Bladder Cancer Neg Hx    Kidney cancer Neg Hx      Social History:   reports that he quit smoking about 35 years ago. His smoking use included cigarettes. He has a 60.00 pack-year smoking history. He has never used smokeless tobacco. He reports that he does not drink alcohol and does not use drugs.  Medications No current facility-administered medications for this encounter.  Current Outpatient Medications:    acetaminophen (TYLENOL) 500 MG tablet, Take 500 mg by mouth every 6 (six) hours as needed for moderate pain., Disp: , Rfl:    aspirin EC 81 MG tablet, Take 81 mg by mouth daily., Disp: , Rfl:    Calcium Carbonate-Vitamin D (CALCIUM-D) 600-400 MG-UNIT TABS, Take 1 tablet by mouth daily., Disp: , Rfl:    finasteride (PROSCAR) 5 MG tablet, TAKE 1 TABLET BY MOUTH EVERY DAY, Disp: 90 tablet, Rfl: 1   glimepiride (AMARYL) 4 MG tablet, Take 1 tablet by mouth daily before breakfast., Disp: , Rfl: 3   lisinopril (PRINIVIL,ZESTRIL) 10 MG tablet, Take 10 mg by mouth 2 (two) times daily., Disp: , Rfl:    magnesium gluconate (MAGONATE) 500 MG tablet, Take 500 mg by mouth daily., Disp: , Rfl:    Multiple Vitamins-Minerals (PRESERVISION AREDS PO), Take 1 tablet by mouth 2 (two) times daily., Disp: , Rfl:  propranolol (INDERAL) 20 MG tablet, Take 20 mg by mouth 2 (two) times daily. , Disp: , Rfl: 3   TRULICITY 1.5 VE/9.3YB SOPN, SMARTSIG:0.5 Milliliter(s) SUB-Q Once a Week, Disp: , Rfl:    Exam: Current vital signs: BP (!) 167/79   Pulse 73   Temp 97.6 F (36.4 C) (Oral)   Resp 20   Ht 5\' 9"  (1.753 m)   Wt 90.7 kg   SpO2 96%   BMI 29.53 kg/m  Vital signs in last 24 hours: Temp:  [97.6 F (36.4 C)] 97.6 F (36.4 C) (11/16 1012) Pulse Rate:  [73] 73 (11/16 1012) Resp:  [20] 20 (11/16 1012) BP: (167)/(79) 167/79 (11/16 1012) SpO2:  [96 %] 96 % (11/16 1012) Weight:  [90.7 kg]  90.7 kg (11/16 1014) General: Awake alert in no distress HEENT: Normocephalic/atraumatic Lungs: Clear Cardiovascular: Regular rhythm Abdomen nondistended nontender Extremities warm well perfused Neurological exam Awake alert oriented x3 Speech is moderately dysarthric No evidence of aphasia Has no trouble with comprehension, repetition or naming. Cranial nerves: Pupils equal round react light, extraocular movements intact, visual fields full, facial sensation intact bilaterally, face appears asymmetric at rest with right lower facial weakness.  He is able to close his eyes tight and wrinkle his forehead.  He is extremely hard of hearing in both ears.  Tongue and palate midline. Sensory: Intact to light touch Coordination with no dysmetria NIH stroke scale-2   Labs I have reviewed labs in epic and the results pertinent to this consultation are:   CBC    Component Value Date/Time   WBC 7.3 10/10/2021 1017   RBC 4.83 10/10/2021 1017   HGB 15.3 10/10/2021 1017   HCT 44.7 10/10/2021 1017   PLT 145 (L) 10/10/2021 1017   MCV 92.5 10/10/2021 1017   MCH 31.7 10/10/2021 1017   MCHC 34.2 10/10/2021 1017   RDW 12.8 10/10/2021 1017   LYMPHSABS 1.7 10/10/2021 1017   MONOABS 0.4 10/10/2021 1017   EOSABS 0.2 10/10/2021 1017   BASOSABS 0.1 10/10/2021 1017    CMP     Component Value Date/Time   NA 139 10/10/2021 1017   K 4.6 10/10/2021 1017   CL 103 10/10/2021 1017   CO2 26 10/10/2021 1017   GLUCOSE 316 (H) 10/10/2021 1017   BUN 16 10/10/2021 1017   CREATININE 0.78 10/10/2021 1017   CREATININE 0.79 02/05/2012 1359   CALCIUM 9.2 10/10/2021 1017   PROT 7.6 10/10/2021 1017   ALBUMIN 4.0 10/10/2021 1017   AST 25 10/10/2021 1017   ALT 20 10/10/2021 1017   ALKPHOS 61 10/10/2021 1017   BILITOT 0.8 10/10/2021 1017   GFRNONAA >60 10/10/2021 1017   GFRNONAA >60 02/05/2012 1359   GFRAA >60 05/10/2016 1043   GFRAA >60 02/05/2012 1359    Lipid Panel  No results found for: CHOL,  TRIG, HDL, CHOLHDL, VLDL, LDLCALC, LDLDIRECT   Imaging I have reviewed the images obtained:  CT-head-no acute changes.  Assessment:  87 year old with past history of bladder cancer, diabetes, prior stroke with no residual deficits, hypertension, essential tremor, presenting for evaluation of slurred speech and right facial droop which was noted sometime around 8 PM yesterday with last known well about an hour before that. Outside the window for IV tPA On examination does have right lower facial weakness and upper motor neuron pattern and dysarthria. Likely a small vessel/lacunar infarction in the brainstem or left hemisphere. Will benefit from risk factor work-up  Impression: Likely small vessel stroke  Recommendations: Admit  to hospitalist Frequent neurochecks Telemetry On home baby aspirin.  For now aspirin 81 and Plavix 75 for 3 weeks. CT angiography of the head and neck 2D echocardiogram A1c Lipid panel High intensity statin PT OT Speech therapy Allow permissive hypertension-treat only if systolic blood pressures greater than 220 on a as needed basis.  After about 48 hours or so start normalizing blood pressure with a goal discharge blood pressure of normotension. Plan discussed with Dr. Kerman Passey  -- Amie Portland, MD Neurologist [facility] Pager: [phone]

## 2021-10-10 NOTE — ED Provider Notes (Signed)
[facility] Emergency Department Provider Note  Time seen: 1:22 PM  I have reviewed the triage vital signs and the nursing notes.   HISTORY  Chief Complaint Facial Droop   HPI Casey Adkins is a 87 y.o. male with a past medical history of arthritis, diabetes, gastric reflux, hypertension, possible CVA/TIA with no deficits who presents to the emergency department for right-sided facial droop and trouble speaking since last night.  According to the wife and the patient since yesterday evening around 7 PM or so he has had some difficulty getting his words out due to slurred speech has had a right-sided facial droop.  No history of CVA with deficits previously.  Otherwise patient feels well denies any sensation of weakness.  Denies any headache or confusion.  Does admit difficulty speaking since yesterday.   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   Medicare annual wellness visit, initial 07/17/2016   B12 deficiency 07/17/2016   History of CVA (cerebrovascular accident) without residual deficits 05/14/2016   PIN III (prostatic intraepithelial neoplasia III) 04/11/2015   Personal history of other malignant neoplasm of skin 09/16/2014   History of nonmelanoma skin cancer 09/16/2014   History of colonic polyps 08/23/2014   Diverticulosis of large intestine 08/23/2014   Benign hematuria 07/20/2014   Type 2 diabetes mellitus ([place]) 07/04/2014   Malignant neoplasm of bladder ([place]) 07/04/2014   DM (diabetes mellitus) type  II controlled with renal manifestation ([place]) 07/04/2014   Benign essential hypertension 07/04/2014   Urge incontinence 12/29/2013   Malignant neoplasm of lateral wall of urinary bladder ([place]) 09/09/2012   Incomplete emptying of bladder 09/09/2012   Enlarged prostate with lower urinary tract symptoms (LUTS) 09/09/2012   Elevated prostate specific antigen (PSA) 09/09/2012   Chronic prostatitis 09/09/2012   Carcinoma in situ of prostate 09/09/2012   Kidney stone 09/09/2012   Benign prostatic hyperplasia with urinary obstruction 09/09/2012    Past Surgical History:  Procedure Laterality Date   CYSTOSCOPY W/ RETROGRADES Bilateral 06/07/2019   Procedure: CYSTOSCOPY WITH RETROGRADE PYELOGRAM;  Surgeon: Hollice Espy, MD;  Location: [facility] ORS;  Service: Urology;  Laterality: Bilateral;   CYSTOSCOPY WITH BIOPSY N/A 06/07/2019   Procedure: CYSTOSCOPY WITH Bladder BIOPSY;  Surgeon: Hollice Espy, MD;  Location: [facility] ORS;  Service: Urology;  Laterality: N/A;   ESOPHAGOGASTRODUODENOSCOPY N/A 04/21/2015   Procedure: ESOPHAGOGASTRODUODENOSCOPY (EGD);  Surgeon: Hulen Luster, MD;  Location: [facility] ENDOSCOPY;  Service: Gastroenterology;  Laterality: N/A;   LITHOTRIPSY     SINUS SURGERY WITH INSTATRAK  2008   STAPEDES SURGERY Left age 55   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 Authorizing Provider  acetaminophen (TYLENOL) 500 MG tablet Take 500 mg by mouth every 6 (six) hours as needed for moderate pain.    [provider]  aspirin EC 81 MG tablet Take 81 mg by mouth daily.    [provider]  Calcium Carbonate-Vitamin D (CALCIUM-D) 600-400 MG-UNIT TABS Take 1 tablet by mouth daily.    [provider]  finasteride (PROSCAR) 5 MG tablet TAKE 1 TABLET BY MOUTH EVERY DAY 09/06/21   Billey Co, MD  glimepiride (AMARYL) 4 MG tablet Take 1 tablet by mouth daily  before breakfast. 03/24/15   [provider]  lisinopril (PRINIVIL,ZESTRIL) 10 MG tablet Take 10 mg by mouth 2 (two) times daily.    [provider]  magnesium gluconate (MAGONATE) 500 MG tablet Take 500 mg by mouth daily.    [provider]  Multiple Vitamins-Minerals (PRESERVISION AREDS PO) Take 1 tablet by mouth 2 (two) times daily.    [provider]  propranolol (INDERAL) 20 MG tablet Take 20 mg by mouth 2 (two) times daily.  01/06/15   [provider]  TRULICITY 1.5 MV/6.7MC SOPN SMARTSIG:0.5 Milliliter(s) SUB-Q Once a Week 04/12/21   [provider]    Allergies  Allergen Reactions   Sulfa Antibiotics Anaphylaxis    Tongue swelling.   Elemental Sulfur Swelling    In throat and tongue    Prilosec [Omeprazole]     GI sxs    Family History  Problem Relation Age of Onset   Prostate cancer Neg Hx    Bladder Cancer Neg Hx    Kidney cancer Neg Hx     Social History Social History   Tobacco Use   Smoking status: Former    Packs/day: 2.00    Years: 30.00    Pack years: 60.00    Types: Cigarettes    Quit date: 06/02/1986    Years since quitting: 35.3   Smokeless tobacco: Never  Vaping Use   Vaping Use: Never used  Substance Use Topics   Alcohol use: No   Drug use: No    Review of Systems Constitutional: Negative for fever. Cardiovascular: Negative for chest pain. Respiratory: Negative for shortness of breath. Gastrointestinal: Negative for abdominal pain, vomiting  Musculoskeletal: Negative for musculoskeletal complaints Neurological: Negative for headache.  Difficulty speaking, right-sided facial droop. All other ROS negative  ____________________________________________   PHYSICAL EXAM:  VITAL SIGNS: ED Triage Vitals  Enc Vitals Group     BP 10/10/21 1012 (!) 167/79     Pulse Rate 10/10/21 1012 73     Resp 10/10/21 1012 20     Temp 10/10/21 1012 97.6 F (36.4 C)     Temp Source 10/10/21 1012 Oral      SpO2 10/10/21 1012 96 %     Weight 10/10/21 1014 200 lb (90.7 kg)     Height 10/10/21 1014 5\' 9"  (1.753 m)     Head Circumference --      Peak Flow --      Pain Score 10/10/21 1013 0     Pain Loc --      Pain Edu? --      Excl. in [place]? --    Constitutional: Alert and oriented. Well appearing and in no distress. Eyes: Normal exam ENT      Head: Normocephalic and atraumatic.      Mouth/Throat: Mucous membranes are moist. Cardiovascular: Normal rate, regular rhythm.  Respiratory: Normal respiratory effort without tachypnea nor retractions. Breath sounds are clear Gastrointestinal: Soft and nontender. No distention.   Musculoskeletal: Nontender with normal range of motion in all extremities.  Neurologic: Patient does have mildly slurred speech.  Moderate  right-sided facial droop with mild to moderate right-sided pronator drift.  Otherwise intact neurological exam. Skin:  Skin is warm, dry and intact.  Psychiatric: Mood and affect are normal.  ____________________________________________    EKG  EKG viewed and interpreted by myself shows a normal sinus rhythm at 72 bpm with a normal QRS/narrow QRS, left axis deviation, largely normal intervals with nonspecific but no concerning ST changes  ____________________________________________    RADIOLOGY  CT head negative for acute abnormality  ____________________________________________   INITIAL IMPRESSION / ASSESSMENT AND PLAN / ED COURSE  Pertinent labs & imaging results that were available during my care of the patient were reviewed by me and considered in my medical decision making (see chart for details).   Patient presents emergency department for cute onset of right-sided facial droop difficulty speaking and found to have a right pronator drift on exam, symptoms starting last night around 7 PM.  Symptoms are very concerning for left MCA territory CVA.  In speaking to the patient and wife in more detail the patient states he  had some type of middle ear implant in the 70s and was told at that time he could not have an MRI in the future.  Given this finding we will hold off on MRI imaging I did speak to neurology who will see the patient shortly, but agree with the plan to admit to the hospitalist service for CVA work-up.  Patient takes aspirin every morning including this morning.  VINAY ERTL was evaluated in Emergency Department on 10/10/2021 for the symptoms described in the history of present illness. He was evaluated in the context of the global COVID-19 pandemic, which necessitated consideration that the patient might be at risk for infection with the SARS-CoV-2 virus that causes COVID-19. Institutional protocols and algorithms that pertain to the evaluation of patients at risk for COVID-19 are in a state of rapid change based on information released by regulatory bodies including the CDC and federal and state organizations. These policies and algorithms were followed during the patient's care in the ED.   NIH Stroke Scale   Interval: Baseline Time: 1:25 PM Person Administering Scale: Harvest Dark  Administer stroke scale items in the order listed. Record performance in each category after each subscale exam. Do not go back and change scores. Follow directions provided for each exam technique. Scores should reflect what the patient does, not what the clinician thinks the patient can do. The clinician should record answers while administering the exam and work quickly. Except where indicated, the patient should not be coached (i.e., repeated requests to patient to make a special effort).   1a  Level of consciousness: 0=alert; keenly responsive  1b. LOC questions:  0=Performs both tasks correctly  1c. LOC commands: 0=Performs both tasks correctly  2.  Best Gaze: 0=normal  3.  Visual: 0=No visual loss  4. Facial Palsy: 2=Partial paralysis (total or near total paralysis of the lower face)  5a.  Motor left  arm: 0=No drift, limb holds 90 (or 45) degrees for full 10 seconds  5b.  Motor right arm: 1=Drift, limb holds 90 (or 45) degrees but drifts down before full 10 seconds: does not hit bed  6a. motor left leg: 0=No drift, limb holds 90 (or 45) degrees for full 10 seconds  6b  Motor right leg:  0=No drift, limb holds 90 (or 45) degrees for full 10 seconds  7. Limb Ataxia: 0=Absent  8.  Sensory: 0=Normal; no sensory loss  9. Best  Language:  0=No aphasia, normal  10. Dysarthria: 1=Mild to moderate, patient slurs at least some words and at worst, can be understood with some difficulty  11. Extinction and Inattention: 0=No abnormality  12. Distal motor function: 0=Normal   Total:   4   ____________________________________________   FINAL CLINICAL IMPRESSION(S) / ED DIAGNOSES  CVA   Harvest Dark, MD 10/10/21 1326

## 2021-10-10 NOTE — H&P (Signed)
History and Physical    Casey Adkins PTW:[id number] DOB: Jul 21, 1934 DOA: 10/10/2021  Referring MD/NP/PA:   PCP: Rusty Aus, MD   Patient coming from:  The patient is coming from home.  At baseline, pt is independent for most of ADL.        Chief Complaint: Right facial droop, slurred speech,  HPI: Casey Adkins is a 87 y.o. 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  Per his wife at bedside, patient's symptoms started last night at about 7-8 PM, including right facial droop, slurred speech, difficulty speaking.  Patient does not have significant weakness in extremities, except for right arm clumsy.  Patient has chronic hard of hearing.  No vision loss.  Patient does not have chest pain, cough, shortness of breath.  No fever or chills.  Denies nausea, vomiting, diarrhea or abdominal pain.  No symptoms of UTI.  ED Course: pt was found to have WBC 7.3, pending COVID PCR, INR 1.1, PTT 32, electrolytes renal function okay, temperature normal, blood pressure 167/79, heart rate 73, RR 20, oxygen saturation 96% on room air.  CT of head is negative for acute intracranial abnormalities.  Dr. Rory Percy of neurology is consulted.   Review of Systems:   General: no fevers, chills, no body weight gain, fatigue HEENT: no blurry vision, hearing changes or sore throat Respiratory: no dyspnea, coughing, wheezing CV: no chest pain, no palpitations GI: no nausea, vomiting, abdominal pain, diarrhea, constipation GU: no dysuria, burning on urination, increased urinary frequency, hematuria  Ext: no leg edema Neuro: Has chronic hearing loss, has right facial droop, slurred speech, difficulty speaking Skin: no rash, no skin tear. MSK: No muscle spasm, no deformity, no limitation  of range of movement in spin Heme: No easy bruising.  Travel history: No recent long distant travel.  Allergy:  Allergies  Allergen Reactions   Sulfa Antibiotics Anaphylaxis    Tongue swelling.   Elemental Sulfur Swelling    In throat and tongue    Prilosec [Omeprazole]     GI sxs    Past Medical History:  Diagnosis Date   Allergic rhinitis    Arthritis    Cancer ([place])    Bladder Cancer   Cancer [facility])    Prostate Cancer   Diabetes mellitus without complication ([place])    Diverticulosis    Dysphagia    GERD (gastroesophageal reflux disease)    History of kidney stones    h/o   Hypertension    Stroke [facility]) 2006   possible light stroke-no deficits    Past Surgical History:  Procedure Laterality Date   CYSTOSCOPY W/ RETROGRADES Bilateral 06/07/2019   Procedure: CYSTOSCOPY WITH RETROGRADE PYELOGRAM;  Surgeon: Hollice Espy, MD;  Location: [facility] ORS;  Service: Urology;  Laterality: Bilateral;   CYSTOSCOPY WITH BIOPSY N/A 06/07/2019   Procedure: CYSTOSCOPY WITH Bladder BIOPSY;  Surgeon: Hollice Espy, MD;  Location: [facility] ORS;  Service: Urology;  Laterality: N/A;   ESOPHAGOGASTRODUODENOSCOPY N/A 04/21/2015   Procedure: ESOPHAGOGASTRODUODENOSCOPY (EGD);  Surgeon: Hulen Luster, MD;  Location: [facility] ENDOSCOPY;  Service: Gastroenterology;  Laterality: N/A;   LITHOTRIPSY     SINUS SURGERY WITH INSTATRAK  2008   STAPEDES SURGERY Left age 34   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 His smoking use included cigarettes. He has a 60.00 pack-year smoking history. He has never used smokeless tobacco. He reports that he does not drink alcohol and does not use drugs.  Family History:  Family History  Problem Relation Age of Onset   Prostate cancer Neg Hx    Bladder Cancer Neg Hx    Kidney cancer Neg Hx      Prior to Admission medications   Medication Sig Start Date End Date  Taking? Authorizing Provider  acetaminophen (TYLENOL) 500 MG tablet Take 500 mg by mouth every 6 (six) hours as needed for moderate pain.    [provider]  aspirin EC 81 MG tablet Take 81 mg by mouth daily.    [provider]  Calcium Carbonate-Vitamin D (CALCIUM-D) 600-400 MG-UNIT TABS Take 1 tablet by mouth daily.    [provider]  finasteride (PROSCAR) 5 MG tablet TAKE 1 TABLET BY MOUTH EVERY DAY 09/06/21   Billey Co, MD  glimepiride (AMARYL) 4 MG tablet Take 1 tablet by mouth daily before breakfast. 03/24/15   [provider]  lisinopril (PRINIVIL,ZESTRIL) 10 MG tablet Take 10 mg by mouth 2 (two) times daily.    [provider]  magnesium gluconate (MAGONATE) 500 MG tablet Take 500 mg by mouth daily.    [provider]  Multiple Vitamins-Minerals (PRESERVISION AREDS PO) Take 1 tablet by mouth 2 (two) times daily.    [provider]  propranolol (INDERAL) 20 MG tablet Take 20 mg by mouth 2 (two) times daily.  01/06/15   [provider]  TRULICITY 1.5 TF/5.7DU SOPN SMARTSIG:0.5 Milliliter(s) SUB-Q Once a Week 04/12/21   [provider]    Physical Exam: Vitals:   10/10/21 1012 10/10/21 1014 10/10/21 1327  BP: (!) 167/79  (!) 155/87  Pulse: 73  75  Resp: 20  17  Temp: 97.6 F (36.4 C)  98.1 F (36.7 C)  TempSrc: Oral  Oral  SpO2: 96%  98%  Weight:  90.7 kg   Height:  5\' 9"  (1.753 m)    General: Not in acute distress HEENT:       Eyes: PERRL, EOMI, no scleral icterus.       ENT: No discharge from the ears and nose, no pharynx injection, no tonsillar enlargement.        Neck: No JVD, no bruit, no mass felt. Heme: No neck lymph node enlargement. Cardiac: S1/S2, RRR, No murmurs, No gallops or rubs. Respiratory: No rales, wheezing, rhonchi or rubs. GI: Soft, nondistended, nontender, no rebound pain, no organomegaly, BS present. GU: No hematuria Ext: No pitting leg edema bilaterally. 1+DP/PT pulse  bilaterally. Musculoskeletal: No joint deformities, No joint redness or warmth, no limitation of ROM in spin. S/p of left index finger amputation Skin: No rashes.  Neuro: Alert, oriented X3, cranial nerves II-XII grossly intact except for right facial droop and HOH. Muscle strength 5/5 in all extremities, sensation to light touch intact.  Psych: Patient is not psychotic, no suicidal or hemocidal ideation.  Labs on Admission: I have personally reviewed following labs and imaging studies  CBC: Recent Labs  Lab 10/10/21 1017  WBC 7.3  NEUTROABS 4.9  HGB 15.3  HCT 44.7  MCV 92.5  PLT 202*   Basic Metabolic Panel: Recent Labs  Lab 10/10/21 1017  NA 139  K 4.6  CL 103  CO2 26  GLUCOSE 316*  BUN 16  CREATININE  0.78  CALCIUM 9.2   GFR: Estimated Creatinine Clearance: 75.1 mL/min (by C-G formula based on SCr of 0.78 mg/dL). Liver Function Tests: Recent Labs  Lab 10/10/21 1017  AST 25  ALT 20  ALKPHOS 61  BILITOT 0.8  PROT 7.6  ALBUMIN 4.0   No results for input(s): LIPASE, AMYLASE in the last 168 hours. No results for input(s): AMMONIA in the last 168 hours. Coagulation Profile: Recent Labs  Lab 10/10/21 1017  INR 1.1   Cardiac Enzymes: No results for input(s): CKTOTAL, CKMB, CKMBINDEX, TROPONINI in the last 168 hours. BNP (last 3 results) No results for input(s): PROBNP in the last 8760 hours. HbA1C: No results for input(s): HGBA1C in the last 72 hours. CBG: No results for input(s): GLUCAP in the last 168 hours. Lipid Profile: No results for input(s): CHOL, HDL, LDLCALC, TRIG, CHOLHDL, LDLDIRECT in the last 72 hours. Thyroid Function Tests: No results for input(s): TSH, T4TOTAL, FREET4, T3FREE, THYROIDAB in the last 72 hours. Anemia Panel: No results for input(s): VITAMINB12, FOLATE, FERRITIN, TIBC, IRON, RETICCTPCT in the last 72 hours. Urine analysis:    Component Value Date/Time   APPEARANCEUR Clear 03/07/2021 0859   GLUCOSEU 1+ (A) 03/07/2021 0859    BILIRUBINUR Negative 03/07/2021 0859   PROTEINUR Negative 03/07/2021 0859   NITRITE Negative 03/07/2021 0859   LEUKOCYTESUR Negative 03/07/2021 0859   Sepsis Labs: @LABRCNTIP (procalcitonin:4,lacticidven:4) )No results found for this or any previous visit (from the past 240 hour(s)).   Radiological Exams on Admission: CT HEAD WO CONTRAST  Result Date: 10/10/2021 CLINICAL DATA:  Left facial droop, slurred speech EXAM: CT HEAD WITHOUT CONTRAST TECHNIQUE: Contiguous axial images were obtained from the base of the skull through the vertex without intravenous contrast. COMPARISON:  05/10/2016 FINDINGS: Brain: There are no signs of bleeding within the cranium. There is no focal mass effect. Cortical sulci are prominent. There is decreased density in the subcortical and periventricular white matter. Vascular: There are scattered arterial calcifications. Skull: Unremarkable Sinuses/Orbits: There is mild mucosal thickening in the ethmoid sinus. Other: None IMPRESSION: No acute intracranial findings are seen in noncontrast CT brain. Atrophy. Small-vessel disease. Electronically Signed   By: Elmer Picker M.D.   On: 10/10/2021 10:44     EKG: I have personally reviewed.  Sinus rhythm, QTC 413, LAD, poor R wave progression, T wave inversion in lead III/aVF.  Assessment/Plan Principal Problem:   Stroke [facility]) Active Problems:   Benign prostatic hyperplasia with urinary obstruction   Diabetes mellitus without complication (HCC)   HTN (hypertension)   Possible Stroke [facility]): Patient has persistent slurred speech, difficulty speaking, right facial droop, concerning for stroke.  CT head is negative.  Patient cannot do MRI due to  the presence of left ear implant. Dr. Rory Percy of neurology is consulted.    -Placed on MedSurg bed for observation - CT angio of head and neck per Dr. Rory Percy - will hold oral Bp meds to allow permissive HTN in the setting of acute stroke - Plavix 75 mg daily and ASA 81 mg  daily per Dr. Rory Percy - start lipitor 80 mg dialy - fasting lipid panel and HbA1c  - 2D transthoracic echocardiography  - swallowing screen. If fails, will get SLP - PT/OT consult  Benign prostatic hyperplasia with urinary obstruction -Proscar  Diabetes mellitus without complication [facility]): Patient is taking Trulicity and Amaryl at home.  Recent A1c 9.5, poorly controlled -SSI  HTN (hypertension) -As needed hydralazine for SBP>220 or dBP>120 -hold home lisinopril and propranolol  DVT ppx:  SQ Lovenox Code Status: Full code per pt and his wife Family Communication:  Yes, patient's  wife  at bed side Disposition Plan:  Anticipate discharge back to previous environment Consults called:  Dr. Rory Percy of neuro Admission status and Level of care: Med-Surg:    for obs    Status is: Observation  The patient remains OBS appropriate and will d/c before 2 midnights.         Date of Service 10/10/2021    [facility]   If 7PM-7AM, please contact night-coverage [email or web address] 10/10/2021, 2:23 PM

## 2021-10-10 NOTE — Plan of Care (Signed)

## 2021-10-10 NOTE — ED Triage Notes (Signed)
Pt to ED with wife for left side facial droop, difficulty walking and slurred speech that started at 2000 last night.   Pt drooling from right side of mouth, wife reports this is normal .   Pt with unsteady gait, provided w/c.   Denies weakness. Equal grip and strength.   Denies visual changes

## 2021-10-11 ENCOUNTER — Observation Stay: Payer: Medicare Other

## 2021-10-11 ENCOUNTER — Telehealth: Payer: Self-pay | Admitting: *Deleted

## 2021-10-11 ENCOUNTER — Observation Stay
Admit: 2021-10-11 | Discharge: 2021-10-11 | Disposition: A | Discharge status: Home / Self Care | Payer: Medicare Other | Attending: Student in an Organized Health Care Education/Training Program | Admitting: Student in an Organized Health Care Education/Training Program

## 2021-10-11 DIAGNOSIS — E663 Overweight: Secondary | ICD-10-CM

## 2021-10-11 DIAGNOSIS — E781 Pure hyperglyceridemia: Secondary | ICD-10-CM

## 2021-10-11 DIAGNOSIS — I639 Cerebral infarction, unspecified: Secondary | ICD-10-CM

## 2021-10-11 DIAGNOSIS — I10 Essential (primary) hypertension: Secondary | ICD-10-CM | POA: Diagnosis not present

## 2021-10-11 LAB — LIPID PANEL
Cholesterol: 181 mg/dL (ref 0–200)
HDL: 34 mg/dL — ABNORMAL LOW (ref >40)
LDL Cholesterol: UNDETERMINED mg/dL (ref 0–99)
Total CHOL/HDL Ratio: 5.3 RATIO
Triglycerides: 483 mg/dL — ABNORMAL HIGH (ref <150)
VLDL: UNDETERMINED mg/dL (ref 0–40)

## 2021-10-11 LAB — GLUCOSE, CAPILLARY
Glucose-Capillary: 191 mg/dL — ABNORMAL HIGH (ref 70–99)
Glucose-Capillary: 218 mg/dL — ABNORMAL HIGH (ref 70–99)

## 2021-10-11 LAB — ECHOCARDIOGRAM COMPLETE
Height: 69 in
S' Lateral: 3.2 cm
Weight: 3200 oz

## 2021-10-11 LAB — HEMOGLOBIN A1C
Hgb A1c MFr Bld: 8.6 % — ABNORMAL HIGH (ref 4.8–5.6)
Mean Plasma Glucose: 200.12 mg/dL

## 2021-10-11 LAB — LDL CHOLESTEROL, DIRECT: Direct LDL: 79.8 mg/dL (ref 0–99)

## 2021-10-11 MED ORDER — CLOPIDOGREL BISULFATE 75 MG PO TABS
75.0000 mg | ORAL_TABLET | Freq: Every day | ORAL | 0 refills | Status: DC
Start: 2021-10-12 — End: 2024-05-18

## 2021-10-11 MED ORDER — ATORVASTATIN CALCIUM 80 MG PO TABS: 80.0000 mg | ORAL_TABLET | Freq: Every day | ORAL | 1 refills | Status: DC

## 2021-10-11 NOTE — Evaluation (Signed)
Physical Therapy Evaluation Patient Details Name: Casey Adkins MRN: [id number] DOB: 03/17/1934 Today's Date: 10/11/2021  History of Present Illness  87 year old past history of bladder cancer diabetes prior stroke with minimal residual deficits hypertension essential tremor presenting for evaluation of sudden onset of slurred speech and right facial droop, arrived outside the window for IV tPA. He does continue to have right upper motor neuron pattern weakness and dysarthria along with dysphagia. 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   Clinical Impression  Pt was pleasant and motivated to participate during the session and put forth good effort throughout. Pt was able to give his name and date of birth, but PT noted slurred speech and he could not give the date or correct month. Pt had difficulty following ther ex instructions, but once explained differently, he was able to complete them. Pt was able to complete bed mobility swiftly with supervision. PT noted pt had right lateral lean in sitting but he was able to correct it without cuing. Pt had mild difficulty with finger to nose test with RUE and heel to shin test wit the RLE. Pt needed min assist with sit to stand with PT placing foot in proper position for appropriate sequencing due to unable to self select placement. Pt was able to ascend 6 steps with min guard for safety and min cuing for proper sequencing. Pt was able to ambulate 250 ft with RW with min guard for safety that progressed to 1 hand held assist to no AD. SpO2 and HR remained WNL throughout the session. Pt will benefit from HHPT upon discharge to safely address deficits listed in patient problem list for decreased caregiver assistance and eventual return to PLOF.     Recommendations for follow up therapy are one component of a multi-disciplinary  discharge planning process, led by the attending physician.  Recommendations may be updated based on patient status, additional functional criteria and insurance authorization.  Follow Up Recommendations Home health PT    Assistance Recommended at Discharge Intermittent Supervision/Assistance  Functional Status Assessment Patient has had a recent decline in their functional status and demonstrates the ability to make significant improvements in function in a reasonable and predictable amount of time.  Equipment Recommendations  Kasandra Knudsen (Family will be able to obtain)    Recommendations for Other Services       Precautions / Restrictions Precautions Precautions: Fall Restrictions Weight Bearing Restrictions: No      Mobility  Bed Mobility Overal bed mobility: Needs Assistance Bed Mobility: Supine to Sit     Supine to sit: Min guard     General bed mobility comments: Min guard for safety    Transfers Overall transfer level: Needs assistance Equipment used: Rolling walker (2 wheels) Transfers: Sit to/from Stand Sit to Stand: Min assist           General transfer comment: Min assist with RLE for foot placement due to pt unable to self select placement and mod cuing for hand placement for proper STS sequencing    Ambulation/Gait Ambulation/Gait assistance: Min guard Gait Distance (Feet): 250 Feet Assistive device: Rolling walker (2 wheels);1 person hand held assist Gait Pattern/deviations: Step-through pattern;Decreased stride length Gait velocity: decreased     General Gait Details: Min guard for safety, demonstrated step through pattern that progressed from RW to 1 hand held assist to min guard for safety  Stairs Stairs: Yes Stairs  assistance: Min guard Stair Management: One rail Left Number of Stairs: 6 General stair comments: Reciprocal pattern ascending the stairs, preferred going up and down with LLE even after cuing from PT to try using the RLE descending  the stairs  Wheelchair Mobility    Modified Rankin (Stroke Patients Only)       Balance Overall balance assessment: Needs assistance Sitting-balance support: Feet supported;Bilateral upper extremity supported Sitting balance-Leahy Scale: Fair Sitting balance - Comments: Right lateral lean with self correction Postural control: Right lateral lean Standing balance support: Bilateral upper extremity supported;During functional activity Standing balance-Leahy Scale: Fair Standing balance comment: Min guard for safety due to initial posterior lean requiring PT assist as well as posterior legs on bed upon standing but was able to correct after min cuing and prolonged time standing, able to maintain balance with wider BOS, with eyes closed, and with external perturbations                             Pertinent Vitals/Pain Pain Assessment: No/denies pain    Home Living Family/patient expects to be discharged to:: Private residence Living Arrangements: Spouse/significant other (Wife) Available Help at Discharge: Family;Available 24 hours/day (Son lives next door, daughter also close) Type of Home: House Home Access: Stairs to enter Entrance Stairs-Rails: Psychiatric nurse of Steps: 4   Home Layout: One level;Other (Comment) (reports having two steps to get into den, has railing available) Home Equipment: Grab bars - tub/shower Additional Comments: pt sometime will use a "walking stick" out in the yard.    Prior Function Prior Level of Function : Independent/Modified Independent             Mobility Comments: Family reports independent with mobilty with use of "walking stick" in yard. ADLs Comments: Family reports being independent in self care tasks at home.     Hand Dominance   Dominant Hand: Right    Extremity/Trunk Assessment   Upper Extremity Assessment Upper Extremity Assessment: RUE deficits/detail RUE Deficits / Details: decreased  coordination with finger to nose test and decreased strength    Lower Extremity Assessment Lower Extremity Assessment: Generalized weakness;RLE deficits/detail RLE Deficits / Details: unable to place foot in appropriate position when doing a STS, decreased coordination with heel to shin test       Communication   Communication: HOH  Cognition Arousal/Alertness: Awake/alert Behavior During Therapy: Flat affect Overall Cognitive Status: Impaired/Different from baseline Area of Impairment: Orientation                 Orientation Level: Person;Situation             General Comments: Could not give the correct reponse when asked what month it was Functional Status Assessment: Patient has had a recent decline in their functional status and demonstrates the ability to make significant improvements in function in a reasonable and predictable amount of time.      General Comments      Exercises Total Joint Exercises Ankle Circles/Pumps: AROM;Both;10 reps;Supine Quad Sets: AROM;Both;10 reps;Supine Long Arc Quad: AROM;Both;10 reps;Seated General Exercises - Upper Extremity Shoulder Flexion: AROM;Both;5 reps;Supine Other Exercises Other Exercises: Weight shifting in standing Other Exercises: Family education on discharge, HHPT, HHOT, and speech therapy   Assessment/Plan    PT Assessment Patient needs continued PT services  PT Problem List Decreased strength;Decreased activity tolerance;Decreased balance;Decreased mobility;Decreased coordination;Decreased cognition;Decreased safety awareness       PT Treatment Interventions DME instruction;Gait training;Stair  training;Functional mobility training;Therapeutic activities;Therapeutic exercise;Balance training;Neuromuscular re-education;Patient/family education    PT Goals (Current goals can be found in the Care Plan section)  Acute Rehab PT Goals Patient Stated Goal: to go home PT Goal Formulation: With patient Time For  Goal Achievement: 10/24/21 Potential to Achieve Goals: Good    Frequency 7X/week   Barriers to discharge        Co-evaluation               AM-PAC PT "6 Clicks" Mobility  Outcome Measure Help needed turning from your back to your side while in a flat bed without using bedrails?: A Little Help needed moving from lying on your back to sitting on the side of a flat bed without using bedrails?: A Little Help needed moving to and from a bed to a chair (including a wheelchair)?: A Little Help needed standing up from a chair using your arms (e.g., wheelchair or bedside chair)?: A Little Help needed to walk in hospital room?: A Little Help needed climbing 3-5 steps with a railing? : A Little 6 Click Score: 18    End of Session Equipment Utilized During Treatment: Gait belt Activity Tolerance: Patient tolerated treatment well Patient left: in chair;with call bell/phone within reach;with chair alarm set;with family/visitor present Nurse Communication: Mobility status PT Visit Diagnosis: Unsteadiness on feet (R26.81);Muscle weakness (generalized) (M62.81);Difficulty in walking, not elsewhere classified (R26.2);Hemiplegia and hemiparesis Hemiplegia - Right/Left: Right Hemiplegia - dominant/non-dominant: Dominant Hemiplegia - caused by: Cerebral infarction    Time: 4037-0964 PT Time Calculation (min) (ACUTE ONLY): 38 min   Charges:   PT Evaluation $PT Eval Moderate Complexity: 1 Mod PT Treatments $Gait Training: 8-22 mins        Sheldon Silvan SPT 10/11/21, 4:28 PM

## 2021-10-11 NOTE — Evaluation (Signed)
Clinical/Bedside Swallow Evaluation Patient Details  Name: Casey Adkins MRN: [id number] Date of Birth: 07/19/1934  Today's Date: 10/11/2021 Time: SLP Start Time (ACUTE ONLY): 1120 SLP Stop Time (ACUTE ONLY): 1139 SLP Time Calculation (min) (ACUTE ONLY): 19 min  Past Medical History:  Past Medical History:  Diagnosis Date   Allergic rhinitis    Arthritis    Cancer ([place])    Bladder Cancer   Cancer ([place])    Prostate Cancer   Diabetes mellitus without complication ([place])    Diverticulosis    Dysphagia    GERD (gastroesophageal reflux disease)    History of kidney stones    h/o   Hypertension    Stroke ([place]) 2006   possible light stroke-no deficits   Past Surgical History:  Past Surgical History:  Procedure Laterality Date   CYSTOSCOPY W/ RETROGRADES Bilateral 06/07/2019   Procedure: CYSTOSCOPY WITH RETROGRADE PYELOGRAM;  Surgeon: Hollice Espy, MD;  Location: [facility] ORS;  Service: Urology;  Laterality: Bilateral;   CYSTOSCOPY WITH BIOPSY N/A 06/07/2019   Procedure: CYSTOSCOPY WITH Bladder BIOPSY;  Surgeon: Hollice Espy, MD;  Location: [facility] ORS;  Service: Urology;  Laterality: N/A;   ESOPHAGOGASTRODUODENOSCOPY N/A 04/21/2015   Procedure: ESOPHAGOGASTRODUODENOSCOPY (EGD);  Surgeon: Hulen Luster, MD;  Location: [facility] ENDOSCOPY;  Service: Gastroenterology;  Laterality: N/A;   LITHOTRIPSY     SINUS SURGERY WITH INSTATRAK  2008   STAPEDES SURGERY Left age 14   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     Per his wife at bedside, patient's symptoms started last night at about 7-8 PM, including right facial droop, slurred  speech, difficulty speaking.  Patient does not have significant weakness in extremities, except for right arm clumsy.  Patient has chronic hard of hearing.  No vision loss.  Patient does not have chest pain, cough, shortness of breath.  No fever or chills.  Denies nausea, vomiting, diarrhea or abdominal pain.  No symptoms of UTI." Most recent Head CT no acute intracranial abnormality.    Assessment / Plan / Recommendation  Clinical Impression  Pt seen for clinical swallowing evaluation. Pt and wife endorse pt with hx of esophageal dysphagia. Pt consumed at regular diet with thin liquids PTA. Pt with complaints of globus sensation with solids which were not reproduced on evaluation. Pt with hx of esophageal dilation per pt/wife (~15 years ago); pt and wife deny pt with new trouble and endorse pt is at swallowing baseline. Son and daughter noted pt observed coughing with breakfast this AM. Son noted, difficulty may have been positional (semi-reclined in bed).   Pt given trials of solids and liquids. Pt demonstrated an intact oral swallow. No overt s/sx pharyngeal dysphagia. To palpation, pt with timely swallow initiation and seemingly adequate laryngeal elevation. No change to vocal quality across trials.   Recommended continuation of a regular diet with thin liquids with safe swallowing strategies and reflux as outlined below. Pt may benefit from f/u with GI provided post-acute. Reviewed diet recommendations, safe swallowing strategies, reflux precautions, and SLP POC with pt. RN also made aware.  SLP proceeded with speech/language evaluation immediately following clinical swallowing evaluation due to notable dysarthria.  See note for additional details.   No f/u SLP services warranted for swallowing; post-acute SLP services recommended for speech/language tx.  SLP Visit Diagnosis: Dysphagia, pharyngoesophageal phase (R13.14)    Aspiration Risk  Mild aspiration risk    Diet Recommendation  Regular;Thin liquid   Medication Administration:  (as tolerated) Supervision: Patient able to self feed (set up) Compensations: Slow rate;Small sips/bites;Follow solids with liquid (masticate solids thoroughly; reflux precautions) Postural Changes: Seated upright at 90 degrees    Other  Recommendations Recommended Consults: Consider GI evaluation (f/u with GI as outpatient) Oral Care Recommendations: Oral care BID;Patient independent with oral care    Recommendations for follow up therapy are one component of a multi-disciplinary discharge planning process, led by the attending physician.  Recommendations may be updated based on patient status, additional functional criteria and insurance authorization.  Follow up Recommendations Home health SLP (for speech/language)      Assistance Recommended at Discharge Frequent or constant Supervision/Assistance  Functional Status Assessment Patient has had a recent decline in their functional status and demonstrates the ability to make significant improvements in function in a reasonable and predictable amount of time.  Frequency and Duration min 2x/week (for speech/language)  2 weeks (for speech/language)       Prognosis Prognosis for Safe Diet Advancement: Good      Swallow Study   General Date of Onset: 10/10/21 HPI: Per 67 H&P "Casey Adkins is a 87 y.o. 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     Per his wife at bedside, patient's symptoms started last night at about 7-8 PM, including right facial droop, slurred speech, difficulty speaking.  Patient does not have significant weakness in extremities, except for right arm clumsy.  Patient has chronic hard of hearing.  No vision loss.  Patient does not have  chest pain, cough, shortness of breath.  No fever or chills.  Denies nausea, vomiting, diarrhea or abdominal pain.  No symptoms of UTI." Most recent Head CT no acute intracranial abnormality. Type of Study: Bedside Swallow Evaluation Previous Swallow Assessment: Esophagram 2019 "Mild narrowing of the distal most aspect of the distal esophagus  resulting in mild delay in passage of a standardized barium tablet.  Presbyesophagus." Diet Prior to this Study: Regular;Thin liquids Temperature Spikes Noted: No Respiratory Status: Room air History of Recent Intubation: No Behavior/Cognition: Alert;Cooperative;Pleasant mood Oral Cavity Assessment: Within Functional Limits Oral Cavity - Dentition: Adequate natural dentition Vision: Functional for self-feeding Self-Feeding Abilities: Needs set up Patient Positioning: Upright in chair Baseline Vocal Quality: Normal Volitional Cough: Strong Volitional Swallow: Able to elicit    Oral/Motor/Sensory Function Overall Oral Motor/Sensory Function: Moderate impairment Facial ROM: Reduced right Facial Symmetry: Abnormal symmetry right Lingual ROM: Within Functional Limits Lingual Symmetry: Within Functional Limits Lingual Strength: Within Functional Limits Velum: Within Functional Limits Mandible: Within Functional Limits   Thin Liquid Thin Liquid: Within functional limits Presentation: Cup;Straw Other Comments: ~4 oz    Solid     Solid: Within functional limits Presentation: Self Fed Other Comments: x2 crackers     Cherrie Gauze, M.S., [facility] [phone] (ASCOM)  Casey Adkins 10/11/2021,12:54 PM

## 2021-10-11 NOTE — Progress Notes (Signed)
*  PRELIMINARY RESULTS* Echocardiogram 2D Echocardiogram has been performed.  Sherrie Sport 10/11/2021, 9:32 AM

## 2021-10-11 NOTE — Progress Notes (Signed)
    Contacted for Zio application. After discussion with team, the Zio will be mailed out to the patient for application.  We will arrange for follow up in our office thereafter to review the results.  Inpatient team updated.

## 2021-10-11 NOTE — Progress Notes (Addendum)
Neurology Progress Note   S:// Seen and examined this morning.  Continues to have dysphagia as well as dysarthria along with right facial droop. Wife reports that the dysphagia has been a problem for over a year but has been more acutely worse since this presentation. Wife said that he required stretching of his esophagus at some point due to the chronic dysphagia.  O:// Current vital signs: BP (!) 158/89 (BP Location: Right Arm)   Pulse 72   Temp 97.9 F (36.6 C) (Oral)   Resp 18   Ht 5\' 9"  (1.753 m)   Wt 90.7 kg   SpO2 98%   BMI 29.53 kg/m  Vital signs in last 24 hours: Temp:  [97.6 F (36.4 C)-98.8 F (37.1 C)] 97.9 F (36.6 C) (11/17 0802) Pulse Rate:  [60-81] 72 (11/17 0802) Resp:  [16-20] 18 (11/17 0802) BP: (139-175)/(79-89) 158/89 (11/17 0802) SpO2:  [93 %-99 %] 98 % (11/17 0802) Weight:  [90.7 kg] 90.7 kg (11/16 1014) General: Awake alert in no distress HNT: Normocephalic atraumatic Cardiovascular: Regular rhythm Respiratory: Breathing well saturating normally on room air Abdomen nondistended nontender Neurological exam Awake alert oriented x3 Speech is moderately dysarthric No evidence of aphasia Cranials: Pupils equal round react light, extra movements intact, visual fields full, right lower facial weakness remains the same as yesterday, extremely hard of hearing in both ears, tongue midline Motor exam: No vertical drift but on individual muscle testing the right upper extremity is 4+/5 and right lower extremities 4+/5.  Left upper and lower extremity 5/5. Sensory exam: No deficits Coordination: Mild slowing of rapid alternating movements.  No obvious dysmetria.   Medications  Current Facility-Administered Medications:    acetaminophen (TYLENOL) tablet 650 mg, 650 mg, Oral, Q4H PRN **OR** acetaminophen (TYLENOL) 160 MG/5ML solution 650 mg, 650 mg, Per Tube, Q4H PRN **OR** acetaminophen (TYLENOL) suppository 650 mg, 650 mg, Rectal, Q4H PRN, Ivor Costa, MD    aspirin EC tablet 81 mg, 81 mg, Oral, Daily, Ivor Costa, MD, 81 mg at 10/11/21 0818   atorvastatin (LIPITOR) tablet 80 mg, 80 mg, Oral, Daily, Ivor Costa, MD, 80 mg at 10/11/21 0818   calcium-vitamin D (OSCAL WITH D) 500MG -200UNIT (5MCG) per tablet 1 tablet, 1 tablet, Oral, Daily, Ivor Costa, MD, 1 tablet at 10/11/21 0818   clopidogrel (PLAVIX) tablet 75 mg, 75 mg, Oral, Daily, Ivor Costa, MD, 75 mg at 10/11/21 0818   enoxaparin (LOVENOX) injection 40 mg, 40 mg, Subcutaneous, Q24H, Ivor Costa, MD, 40 mg at 10/10/21 2245   finasteride (PROSCAR) tablet 5 mg, 5 mg, Oral, Daily, Ivor Costa, MD, 5 mg at 10/11/21 0818   hydrALAZINE (APRESOLINE) injection 5 mg, 5 mg, Intravenous, Q2H PRN, Ivor Costa, MD   insulin aspart (novoLOG) injection 0-5 Units, 0-5 Units, Subcutaneous, QHS, Niu, Xilin, MD   insulin aspart (novoLOG) injection 0-9 Units, 0-9 Units, Subcutaneous, TID WC, Ivor Costa, MD, 2 Units at 10/11/21 0818   multivitamin-lutein (OCUVITE-LUTEIN) capsule 1 capsule, 1 capsule, Oral, BID, Ivor Costa, MD, 1 capsule at 10/10/21 2245   ondansetron (ZOFRAN) injection 4 mg, 4 mg, Intravenous, Q8H PRN, Ivor Costa, MD   senna-docusate (Senokot-S) tablet 1 tablet, 1 tablet, Oral, QHS PRN, Ivor Costa, MD   vitamin B-12 (CYANOCOBALAMIN) tablet 500 mcg, 500 mcg, Oral, Daily, Ivor Costa, MD, 500 mcg at 10/11/21 0818 Labs CBC    Component Value Date/Time   WBC 7.3 10/10/2021 1017   RBC 4.83 10/10/2021 1017   HGB 15.3 10/10/2021 1017   HCT 44.7 10/10/2021  1017   PLT 145 (L) 10/10/2021 1017   MCV 92.5 10/10/2021 1017   MCH 31.7 10/10/2021 1017   MCHC 34.2 10/10/2021 1017   RDW 12.8 10/10/2021 1017   LYMPHSABS 1.7 10/10/2021 1017   MONOABS 0.4 10/10/2021 1017   EOSABS 0.2 10/10/2021 1017   BASOSABS 0.1 10/10/2021 1017    CMP     Component Value Date/Time   NA 139 10/10/2021 1017   K 4.6 10/10/2021 1017   CL 103 10/10/2021 1017   CO2 26 10/10/2021 1017   GLUCOSE 316 (H) 10/10/2021 1017   BUN 16  10/10/2021 1017   CREATININE 0.78 10/10/2021 1017   CREATININE 0.79 02/05/2012 1359   CALCIUM 9.2 10/10/2021 1017   PROT 7.6 10/10/2021 1017   ALBUMIN 4.0 10/10/2021 1017   AST 25 10/10/2021 1017   ALT 20 10/10/2021 1017   ALKPHOS 61 10/10/2021 1017   BILITOT 0.8 10/10/2021 1017   GFRNONAA >60 10/10/2021 1017   GFRNONAA >60 02/05/2012 1359   GFRAA >60 05/10/2016 1043   GFRAA >60 02/05/2012 1359      Lipid Panel     Component Value Date/Time   CHOL 181 10/11/2021 0501   TRIG 483 (H) 10/11/2021 0501   HDL 34 (L) 10/11/2021 0501   CHOLHDL 5.3 10/11/2021 0501   VLDL UNABLE TO CALCULATE IF TRIGLYCERIDE OVER 400 mg/dL 10/11/2021 0501   LDLCALC UNABLE TO CALCULATE IF TRIGLYCERIDE OVER 400 mg/dL 10/11/2021 0501   A1c pending 2D echo completed-pending  Imaging I have reviewed images in epic and the results pertinent to this consultation are: Initial CT head with no acute changes Repeat 24-hour CT head with no acute changes CT angio head and neck-no emergent LVO.  Mild intracranial ICA stenosis.  Major posterior circulation arteries patent.  Mild calcified plaque along the aortic arch, right ICA with calcified plaque along the proximal portion causing less than 50% stenosis, left carotid system without significant stenosis and with calcific plaque.  Assessment: 87 year old past history of bladder cancer diabetes prior stroke with no residual deficits hypertension essential tremor presenting for evaluation of sudden onset of slurred speech and right facial droop, outside the window for IV tPA. He does continue to have right upper motor neuron pattern weakness and dysarthria along with dysphagia. 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 Cannot get an MRI due to a middle ear/inner ear implant according to wife.  On a side note, wife is also concerned about the dysphagia  which is acutely worse but has been progressively worsening.  Said he had required some esophageal procedure in the past.  I would defer management to the primary team.   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 Some baseline dysphagia with acute worsening likely due to the stroke.  Recommendations: Continue aspirin and Plavix for 3 weeks-followed by aspirin only. 2D echocardiogram pending-we will follow A1c-goal less than 7 Lipid panel shows severe derangements in triglycerides and unable to calculate LDL.  Needs high intensity statin with a goal LDL of less than 70. PT OT speech therapy Will follow. Preliminary plan discussed with Dr. Maryland Pink over secure chat.   Addendum 2D echocardiogram with normal LVEF but biatrial dilatation. Would recommend outpatient 30-day cardiac monitoring to look for any evidence of paroxysmal afibrillation. A1c also elevated.  Goal A1c less than 7-management per hospitalist and outpatient primary care  provider. Evaluation of worsening dysphagia as an outpatient. Discussed plan with Dr. [place] Pink over secure chat.    -- Amie Portland, MD Neurologist [facility] Pager: [phone]

## 2021-10-11 NOTE — TOC Transition Note (Signed)
Transition of Care [facility]) - CM/SW Discharge Note   Patient Details  Name: Casey Adkins MRN: [id number] Date of Birth: 1934-05-22  [facility] CM/SW Contact:  Shelbie Hutching, RN Phone Number: 10/11/2021, 3:46 PM   Clinical Narrative:    Patient placed under observation for stroke.  Patient is medically ready for discharge today.  Cardiology will have a heart monitor delivered to the patient's home to put on and then follow up in the office.  PT and OT recommend HH and patient and family agree.  Tommi Rumps with [facility] accepted home health referral for PT, OT, and speech.  Patient lives with his wife at home and is independent and drives.  Patient's son or daughter will be transporting him home today.  PT recommends [facility] Knudsen and family will purchase one for him.     Final next level of care: [place] Barriers to Discharge: Barriers Resolved   Patient Goals and CMS Choice Patient states their goals for this hospitalization and ongoing recovery are:: Patient glad to be going home and agrees with home health CMS Medicare.gov Compare Post Acute Care list provided to:: Patient Choice offered to / list presented to : Patient  Discharge Placement                       Discharge Plan and Services   Discharge Planning Services: CM Consult Post Acute Care Choice: Home Health          DME Arranged: N/A DME Agency: NA       HH Arranged: PT, OT, Speech Therapy HH Agency: [place] Date [place]: 10/11/21 Time [place]: 2800 Representative spoke with at [place]: [place] Interventions     Readmission Risk Interventions No flowsheet data found.

## 2021-10-11 NOTE — Progress Notes (Signed)
SLP Cancellation Note  Patient Details Name: Casey Adkins MRN: [id number] DOB: [birth year]   Cancelled treatment:       Reason Eval/Treat Not Completed: Patient at procedure or test/unavailable. SLP consult received and appreciated. Chart review completed. Pt currently OTF for Head CT. Will continue efforts as appropriate.    Cherrie Gauze, M.S., [facility] [phone] [place])    Quintella Baton 10/11/2021, 8:30 AM

## 2021-10-11 NOTE — Progress Notes (Signed)
Patient has been discharged home with [facility].  Discharge papers given and explained to patient and spouse.  Both verbalized understanding.  Meds and f/u appointments reviewed.  Rx sent electronically to the pharmacy. Spouse made aware.

## 2021-10-11 NOTE — Discharge Summary (Signed)
Discharge Summary  CHRLES SELLEY LFY:[id number] DOB: 01-Aug-1934  PCP: Rusty Aus, MD  Admit date: 10/10/2021 Discharge date: 10/11/2021  Time spent: 45 minutes  Recommendations for Outpatient Follow-up:  New medication: Atorvastatin 80 mg p.o. daily New medication: Plavix 75 mg p.o. daily x3 weeks Patient will be following up with his primary care physician in 1 week.  At that time, adjustments can be made as per PCP in regards to diabetic medication for better control. Patient being discharged home with home health PT, OT, speech therapy and RN Cardiology will follow up with patient for 30-day event monitor  Discharge Diagnoses:  Active Hospital Problems   Diagnosis Date Noted   Stroke ([place]) 10/10/2021   Overweight (BMI 25.0-29.9) 10/11/2021   Hypertriglyceridemia 10/11/2021   Diabetes mellitus without complication ([place]) 85/27/7824   HTN (hypertension) 10/10/2021   Benign prostatic hyperplasia with urinary obstruction 09/09/2012    Resolved Hospital Problems  No resolved problems to display.    Discharge Condition: Improved, being discharged home  Diet recommendation: Heart healthy, carb modified  Vitals:   10/11/21 0802 10/11/21 1238  BP: (!) 158/89 (!) 143/76  Pulse: 72 71  Resp: 18 18  Temp: 97.9 F (36.6 C) 97.9 F (36.6 C)  SpO2: 98% 96%    History of present illness:  87 year old male with past medical history of CVA without deficit in the past, hypertension, diabetes mellitus and BPH who presented to the emergency room on the early morning of 11/16 after started having symptoms the night before difficulty speaking.  Emergency room, work-up unrevealing.  CT negative.  Patient unable to get MRI due to previous ear implant that was a contraindication.  Brought into the hospital service for further evaluation.  Hospital Course:  Principal Problem:   Stroke [facility]): Neurology consulted.  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  Already on aspirin, started on Plavix which will continue for the next 3 weeks.  Seen by PT and OT and speech therapy who are recommending home health follow-up.  A1c checked and found be elevated 8.3.  Patient on Trulicity as well as 4 mg of Amaryl.  He has an appointment with his PCP next week and at that time his medications will be adjusted.  Patient had echocardiogram done which showed a preserved ejection fraction but bilateral enlarged atria.  Has been in normal sinus rhythm during hospitalization.  Cardiology setting up 30-day event monitor as outpatient.  Patient had lipid panel checked and noted to have a low HDL of 34 and unable to check LDL due to high triglycerides of 43.  Started on atorvastatin 80 mg p.o. daily. Active Problems:   Benign prostatic hyperplasia with urinary obstruction   Diabetes mellitus without complication ([place]): As above.  Meets with PCP next week and will need medication adjustment to have better control of her diabetes mellitus.    HTN (hypertension): Allow for some permissive hypertension.    Overweight (BMI 25.0-29.9): Patient meets criteria for BMI greater than 25.    Hypertriglyceridemia: Started on atorvastatin.  Triglycerides of 43.   Procedures: Echocardiogram noted enlarged atria bilaterally  Consultations: Case discussed with cardiology Neurology  Discharge Exam: BP (!) 143/76 (BP Location: Right Arm)   Pulse 71   Temp 97.9 F (36.6 C) (Oral)   Resp 18   Ht 5\' 9"  (1.753 m)   Wt 90.7 kg  SpO2 96%   BMI 29.53 kg/m   General: Alert and oriented x3, some dysarthria but, but otherwise appears oriented Cardiovascular: Regular rate and rhythm, S1-S2 Respiratory: Clear to auscultation bilaterally  Discharge Instructions You were cared for by a hospitalist during your hospital stay. If you have any questions about your discharge  medications or the care you received while you were in the hospital after you are discharged, you can call the unit and asked to speak with the hospitalist on call if the hospitalist that took care of you is not available. Once you are discharged, your primary care physician will handle any further medical issues. Please note that NO REFILLS for any discharge medications will be authorized once you are discharged, as it is imperative that you return to your primary care physician (or establish a relationship with a primary care physician if you do not have one) for your aftercare needs so that they can reassess your need for medications and monitor your lab values.  Discharge Instructions     Diet - low sodium heart healthy   Complete by: As directed    Increase activity slowly   Complete by: As directed       Allergies as of 10/11/2021       Reactions   Sulfa Antibiotics Anaphylaxis   Tongue swelling.   Elemental Sulfur Swelling   In throat and tongue    Prilosec [omeprazole]    GI sxs        Medication List     TAKE these medications    acetaminophen 500 MG tablet Commonly known as: TYLENOL Take 500 mg by mouth every 6 (six) hours as needed for moderate pain.   aspirin EC 81 MG tablet Take 81 mg by mouth daily.   atorvastatin 80 MG tablet Commonly known as: LIPITOR Take 1 tablet (80 mg total) by mouth daily. Start taking on: October 12, 2021   Calcium-D 600-400 MG-UNIT Tabs Take 1 tablet by mouth daily.   clopidogrel 75 MG tablet Commonly known as: PLAVIX Take 1 tablet (75 mg total) by mouth daily. Start taking on: October 12, 2021   finasteride 5 MG tablet Commonly known as: PROSCAR TAKE 1 TABLET BY MOUTH EVERY DAY   glimepiride 4 MG tablet Commonly known as: AMARYL Take 1 tablet by mouth daily before breakfast.   lisinopril 10 MG tablet Commonly known as: ZESTRIL Take 10 mg by mouth 2 (two) times daily.   PRESERVISION AREDS PO Take 1 tablet by mouth  2 (two) times daily.   propranolol 20 MG tablet Commonly known as: INDERAL Take 20 mg by mouth 2 (two) times daily.   Trulicity 1.5 IW/5.8KD Sopn Generic drug: Dulaglutide SMARTSIG:0.5 Milliliter(s) SUB-Q Once a Week   vitamin B-12 500 MCG tablet Commonly known as: CYANOCOBALAMIN Take 500 mcg by mouth daily.       Allergies  Allergen Reactions   Sulfa Antibiotics Anaphylaxis    Tongue swelling.   Elemental Sulfur Swelling    In throat and tongue    Prilosec [Omeprazole]     GI sxs      The results of significant diagnostics from this hospitalization (including imaging, microbiology, ancillary and laboratory) are listed below for reference.    Significant Diagnostic Studies: CT ANGIO HEAD NECK W WO CM  Result Date: 10/10/2021 CLINICAL DATA:  Neuro deficit, acute, stroke suspected; left facial droop and slurred speech EXAM: CT ANGIOGRAPHY HEAD AND NECK TECHNIQUE: Multidetector CT imaging of the head and neck was performed  using the standard protocol during bolus administration of intravenous contrast. Multiplanar CT image reconstructions and MIPs were obtained to evaluate the vascular anatomy. Carotid stenosis measurements (when applicable) are obtained utilizing NASCET criteria, using the distal internal carotid diameter as the denominator. CONTRAST:  67mL OMNIPAQUE IOHEXOL 350 MG/ML SOLN COMPARISON:  None. FINDINGS: CTA NECK Aortic arch: Mild calcified plaque along the arch and patent great vessel origins. No high-grade stenosis of the proximal subclavian arteries. Right carotid system: Patent. Retropharyngeal course. Anterior bridging osteophyte at C4-C5 mildly indents the posterior distal common carotid. Primarily calcified plaque along the proximal internal carotid causing less than 50% stenosis. Left carotid system: Patent. Retropharyngeal course. Calcified plaque along the proximal internal carotid without stenosis. Vertebral arteries: Patent. Plaque is present at the origins  bilaterally with probable high-grade stenosis but no apparent flow limitation. Skeleton: Cervical spine degenerative changes. Other neck: Unremarkable. Upper chest: Included upper lungs no apical lung mass. Review of the MIP images confirms the above findings CTA HEAD Anterior circulation: Intracranial internal carotid arteries patent with calcified plaque causing mild stenosis. Anterior and middle cerebral arteries are patent. Posterior circulation: Intracranial vertebral arteries are patent with mild plaque. Basilar artery is patent. Major cerebellar artery origins are patent. Posterior cerebral arteries are patent. Venous sinuses: Patent as allowed by contrast bolus timing. Review of the MIP images confirms the above findings IMPRESSION: No large vessel occlusion, hemodynamically significant stenosis, or evidence of dissection. Electronically Signed   By: Macy Mis M.D.   On: 10/10/2021 15:28   CT HEAD WO CONTRAST (5MM)  Result Date: 10/11/2021 CLINICAL DATA:  Slurred speech, right-sided facial droop. EXAM: CT HEAD WITHOUT CONTRAST TECHNIQUE: Contiguous axial images were obtained from the base of the skull through the vertex without intravenous contrast. COMPARISON:  October 10, 2021. FINDINGS: Brain: Mild chronic ischemic white matter disease is noted. No mass effect or midline shift is noted. Ventricular size is within normal limits. There is no evidence of mass lesion, hemorrhage or acute infarction. Vascular: No hyperdense vessel or unexpected calcification. Skull: Normal. Negative for fracture or focal lesion. Sinuses/Orbits: No acute finding. Other: None. IMPRESSION: No acute intracranial abnormality seen. Electronically Signed   By: Marijo Conception M.D.   On: 10/11/2021 08:48   CT HEAD WO CONTRAST  Result Date: 10/10/2021 CLINICAL DATA:  Left facial droop, slurred speech EXAM: CT HEAD WITHOUT CONTRAST TECHNIQUE: Contiguous axial images were obtained from the base of the skull through the  vertex without intravenous contrast. COMPARISON:  05/10/2016 FINDINGS: Brain: There are no signs of bleeding within the cranium. There is no focal mass effect. Cortical sulci are prominent. There is decreased density in the subcortical and periventricular white matter. Vascular: There are scattered arterial calcifications. Skull: Unremarkable Sinuses/Orbits: There is mild mucosal thickening in the ethmoid sinus. Other: None IMPRESSION: No acute intracranial findings are seen in noncontrast CT brain. Atrophy. Small-vessel disease. Electronically Signed   By: Elmer Picker M.D.   On: 10/10/2021 10:44   ECHOCARDIOGRAM COMPLETE  Result Date: 10/11/2021    ECHOCARDIOGRAM REPORT   Patient Name:   SHAHZAIB AZEVEDO Date of Exam: 10/11/2021 Medical Rec #:  [id number]       Height:       69.0 in Accession #:    [id number]      Weight:       200.0 lb Date of Birth:  05-09-1934       BSA:          2.066 m Patient Age:  87 years        BP:           158/89 mmHg Patient Gender: M               HR:           72 bpm. Exam Location:  [facility] Procedure: 2D Echo, Cardiac Doppler and Color Doppler Indications:     Stroke I63.9  History:         Patient has no prior history of Echocardiogram examinations.                  Stroke; Risk Factors:Hypertension and Diabetes.  Sonographer:     Sherrie Sport Referring Phys:  [id number] [place] Diagnosing Phys: Neoma Laming  Sonographer Comments: Technically challenging study due to limited acoustic windows, no apical window and no subcostal window. IMPRESSIONS  1. Left ventricular ejection fraction, by estimation, is 55 to 60%. The left ventricle has normal function. The left ventricle has no regional wall motion abnormalities. Left ventricular diastolic parameters are consistent with Grade I diastolic dysfunction (impaired relaxation).  2. Right ventricular systolic function is normal. The right ventricular size is normal.  3. Left atrial size was mildly dilated.  4. Right atrial size  was mildly dilated.  5. The mitral valve is normal in structure. No evidence of mitral valve regurgitation. No evidence of mitral stenosis.  6. The aortic valve is normal in structure. Aortic valve regurgitation is trivial. No aortic stenosis is present.  7. The inferior vena cava is normal in size with greater than 50% respiratory variability, suggesting right atrial pressure of 3 mmHg. FINDINGS  Left Ventricle: Left ventricular ejection fraction, by estimation, is 55 to 60%. The left ventricle has normal function. The left ventricle has no regional wall motion abnormalities. The left ventricular internal cavity size was normal in size. There is  no left ventricular hypertrophy. Left ventricular diastolic parameters are consistent with Grade I diastolic dysfunction (impaired relaxation). Right Ventricle: The right ventricular size is normal. No increase in right ventricular wall thickness. Right ventricular systolic function is normal. Left Atrium: Left atrial size was mildly dilated. Right Atrium: Right atrial size was mildly dilated. Pericardium: There is no evidence of pericardial effusion. Mitral Valve: The mitral valve is normal in structure. No evidence of mitral valve regurgitation. No evidence of mitral valve stenosis. Tricuspid Valve: The tricuspid valve is normal in structure. Tricuspid valve regurgitation is not demonstrated. No evidence of tricuspid stenosis. Aortic Valve: The aortic valve is normal in structure. Aortic valve regurgitation is trivial. No aortic stenosis is present. Pulmonic Valve: The pulmonic valve was normal in structure. Pulmonic valve regurgitation is not visualized. No evidence of pulmonic stenosis. Aorta: The aortic root is normal in size and structure. Venous: The inferior vena cava is normal in size with greater than 50% respiratory variability, suggesting right atrial pressure of 3 mmHg. IAS/Shunts: No atrial level shunt detected by color flow Doppler.  LEFT VENTRICLE PLAX 2D  LVIDd:         4.50 cm LVIDs:         3.20 cm LV PW:         1.00 cm LV IVS:        1.20 cm LVOT diam:     2.00 cm LVOT Area:     3.14 cm  LEFT ATRIUM         Index LA diam:    2.80 cm 1.36 cm/m  PULMONIC VALVE AORTA                 PV Vmax:        0.56 m/s Ao Root diam: 3.40 cm PV Vmean:       36.700 cm/s                       PV VTI:         0.088 m                       PV Peak grad:   1.3 mmHg                       PV Mean grad:   1.0 mmHg                       RVOT Peak grad: 3 mmHg   SHUNTS Systemic Diam: 2.00 cm Pulmonic VTI:  0.156 m Neoma Laming Electronically signed by Neoma Laming Signature Date/Time: 10/11/2021/2:23:19 PM    Final     Microbiology: Recent Results (from the past 240 hour(s))  Resp Panel by RT-PCR (Flu A&B, Covid) Nasopharyngeal Swab     Status: None   Collection Time: 10/10/21  1:38 PM   Specimen: Nasopharyngeal Swab; Nasopharyngeal(NP) swabs in vial transport medium  Result Value Ref Range Status   SARS Coronavirus 2 by RT PCR NEGATIVE NEGATIVE Final    Comment: (NOTE) SARS-CoV-2 target nucleic acids are NOT DETECTED.  The SARS-CoV-2 RNA is generally detectable in upper respiratory specimens during the acute phase of infection. The lowest concentration of SARS-CoV-2 viral copies this assay can detect is 138 copies/mL. A negative result does not preclude SARS-Cov-2 infection and should not be used as the sole basis for treatment or other patient management decisions. A negative result may occur with  improper specimen collection/handling, submission of specimen other than nasopharyngeal swab, presence of viral mutation(s) within the areas targeted by this assay, and inadequate number of viral copies(<138 copies/mL). A negative result must be combined with clinical observations, patient history, and epidemiological information. The expected result is Negative.  Fact Sheet for Patients:  EntrepreneurPulse.com.au  Fact  Sheet for Healthcare Providers:  IncredibleEmployment.be  This test is no t yet approved or cleared by the [place] FDA and  has been authorized for detection and/or diagnosis of SARS-CoV-2 by FDA under an Emergency Use Authorization (EUA). This EUA will remain  in effect (meaning this test can be used) for the duration of the COVID-19 declaration under Section 564(b)(1) of the Act, 21 U.S.C.section 360bbb-3(b)(1), unless the authorization is terminated  or revoked sooner.       Influenza A by PCR NEGATIVE NEGATIVE Final   Influenza B by PCR NEGATIVE NEGATIVE Final    Comment: (NOTE) The Xpert Xpress SARS-CoV-2/FLU/RSV plus assay is intended as an aid in the diagnosis of influenza from Nasopharyngeal swab specimens and should not be used as a sole basis for treatment. Nasal washings and aspirates are unacceptable for Xpert Xpress SARS-CoV-2/FLU/RSV testing.  Fact Sheet for Patients: EntrepreneurPulse.com.au  Fact Sheet for Healthcare Providers: IncredibleEmployment.be  This test is not yet approved or cleared by the [place] FDA and has been authorized for detection and/or diagnosis of SARS-CoV-2 by FDA under an Emergency Use Authorization (EUA). This EUA will remain in effect (meaning this test can be used) for the duration of the COVID-19 declaration under Section 564(b)(1)  of the Act, 21 U.S.C. section 360bbb-3(b)(1), unless the authorization is terminated or revoked.  Performed at [facility], [address] [place], [place] [address]      Labs: Basic Metabolic Panel: Recent Labs  Lab 10/10/21 1017  NA 139  K 4.6  CL 103  CO2 26  GLUCOSE 316*  BUN 16  CREATININE 0.78  CALCIUM 9.2   Liver Function Tests: Recent Labs  Lab 10/10/21 1017  AST 25  ALT 20  ALKPHOS 61  BILITOT 0.8  PROT 7.6  ALBUMIN 4.0   No results for input(s): LIPASE, AMYLASE in the last 168 hours. No results  for input(s): AMMONIA in the last 168 hours. CBC: Recent Labs  Lab 10/10/21 1017  WBC 7.3  NEUTROABS 4.9  HGB 15.3  HCT 44.7  MCV 92.5  PLT 145*   Cardiac Enzymes: No results for input(s): CKTOTAL, CKMB, CKMBINDEX, TROPONINI in the last 168 hours. BNP: BNP (last 3 results) No results for input(s): BNP in the last 8760 hours.  ProBNP (last 3 results) No results for input(s): PROBNP in the last 8760 hours.  CBG: Recent Labs  Lab 10/10/21 1606 10/10/21 2123 10/11/21 0803 10/11/21 1239  GLUCAP 227* 200* 191* 218*       Signed:  Annita Brod, MD [facility] 10/11/2021, 3:33 PM

## 2021-10-11 NOTE — Telephone Encounter (Signed)
Per Christell Faith PA-C  He will need follow up with an MD to establish care and review the results thereafter.

## 2021-10-11 NOTE — Telephone Encounter (Signed)
-----   Message from Rise Mu, PA-C sent at 10/11/2021  3:46 PM EST ----- Casey Adkins,   Can you please mail this patient a Zio. He is discharging from the hospital today and the hospitalist requested one.   Dx: CVA  He will need follow up with an MD to establish care and review the results thereafter.

## 2021-10-11 NOTE — Evaluation (Signed)
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   Medicare annual wellness visit, initial 07/17/2016   B12 deficiency 07/17/2016   History of CVA (cerebrovascular accident) without residual deficits 05/14/2016   PIN III (prostatic intraepithelial neoplasia III) 04/11/2015   Personal history of other malignant neoplasm of skin 09/16/2014   History of nonmelanoma skin cancer 09/16/2014   History of colonic polyps 08/23/2014   Diverticulosis of large intestine 08/23/2014   Benign hematuria 07/20/2014   Type 2 diabetes mellitus ([place]) 07/04/2014   Malignant neoplasm of bladder ([place]) 07/04/2014   DM (diabetes mellitus) type II controlled with renal manifestation ([place]) 07/04/2014   Benign essential hypertension 07/04/2014   Urge incontinence 12/29/2013   Malignant neoplasm of lateral wall of urinary bladder ([place]) 09/09/2012   Incomplete emptying of bladder 09/09/2012   Enlarged prostate with lower urinary tract symptoms (LUTS) 09/09/2012   Elevated prostate specific antigen (PSA) 09/09/2012   Chronic prostatitis 09/09/2012   Carcinoma in situ of prostate 09/09/2012   Kidney stone 09/09/2012   Benign prostatic hyperplasia with urinary obstruction 09/09/2012   Past Medical History:  Past Medical History:  Diagnosis Date   Allergic rhinitis    Arthritis    Cancer ([place])    Bladder Cancer   Cancer ([place])    Prostate Cancer   Diabetes mellitus without complication ([place])    Diverticulosis    Dysphagia    GERD (gastroesophageal reflux disease)    History of kidney stones    h/o   Hypertension     Stroke ([place]) 2006   possible light stroke-no deficits   Past Surgical History:  Past Surgical History:  Procedure Laterality Date   CYSTOSCOPY W/ RETROGRADES Bilateral 06/07/2019   Procedure: CYSTOSCOPY WITH RETROGRADE PYELOGRAM;  Surgeon: Hollice Espy, MD;  Location: [facility] ORS;  Service: Urology;  Laterality: Bilateral;   CYSTOSCOPY WITH BIOPSY N/A 06/07/2019   Procedure: CYSTOSCOPY WITH Bladder BIOPSY;  Surgeon: Hollice Espy, MD;  Location: [facility] ORS;  Service: Urology;  Laterality: N/A;   ESOPHAGOGASTRODUODENOSCOPY N/A 04/21/2015   Procedure: ESOPHAGOGASTRODUODENOSCOPY (EGD);  Surgeon: Hulen Luster, MD;  Location: [facility] ENDOSCOPY;  Service: Gastroenterology;  Laterality: N/A;   LITHOTRIPSY     SINUS SURGERY WITH INSTATRAK  2008   STAPEDES SURGERY Left age 83   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     Per his wife at bedside, patient's symptoms started last night at about 7-8 PM, including right facial droop, slurred speech, difficulty speaking.  Patient does not have significant weakness in extremities, except for right arm clumsy.  Patient has chronic hard of hearing.  No vision loss.  Patient does not have chest pain, cough, shortness of breath.  No fever or chills.  Denies nausea, vomiting, diarrhea or abdominal pain.  No symptoms of UTI." Most recent Head  CT no acute intracranial abnormality.   Assessment / Plan / Recommendation Clinical Impression  Pt seen for speech/language evaluation. Pt HOH and benefited from decreased environmental distractions and increased speaking volume from communication partners. Basic auditory  comprehension was judged to be [facility] per informal assessment. Pt presents with s/sx moderate dysarthria with decreased speech intelligibility (~60% at the word level, ~75% at the phrase, sentence, and conversation level). Pt with fast rate of speech and imprecise articulation. Pt benefited from cues for slowed speech, increased vocal loudness, and overarticulation. Pt demonstrated intact confrontation naming and responsive naming. Mildly reduced generative naming noted (10 animals in 60s). Son reported pt with x1 instance of semantic paraphasia this AM - not witnessed by SLP. Further diagnostic tx warranted.   Recommend continued SLP services in acute and post-acute for improved functional communication, use of compensatory strategies, and further diagnostic tx. Anticipate need for initial 24 hour supervision/assistance at d/c.  Pt and wife educated re: role of SLP, results of assessment, strategies to improve basic functional communication, d/c recommendations, and SLP POC. Son and daughter also updated. RN made aware of results, recommendations, and SLP POC.   SLP to f/u per POC for skilled SLP services while pt in house.     SLP Assessment  SLP Recommendation/Assessment: Patient needs continued Speech [place] [facility] SLP Visit Diagnosis: Dysarthria and anarthria (R47.1)    Recommendations for follow up therapy are one component of a multi-disciplinary discharge planning process, led by the attending physician.  Recommendations may be updated based on patient status, additional functional criteria and insurance authorization.    Follow Up Recommendations  Home health SLP (for speech/language)    Assistance Recommended at Discharge  Frequent or constant Supervision/Assistance  Functional Status Assessment Patient has had a recent decline in their functional status and demonstrates the ability to make significant improvements in function in a reasonable and predictable amount of time.   Frequency and Duration min 2x/week  2 weeks      SLP Evaluation Cognition  Overall Cognitive Status: Within Functional Limits for tasks assessed Orientation Level: Oriented X4       Comprehension  Auditory Comprehension Overall Auditory Comprehension: Appears within functional limits for tasks assessed Yes/No Questions: Within Functional Limits Commands: Within Functional Limits Conversation: Complex (WFL) Interfering Components: Hearing EffectiveTechniques: Increased volume Visual Recognition/Discrimination Discrimination: Within Function Limits Reading Comprehension Reading Status:  (DNT)    Expression Expression Primary Mode of Expression: Verbal Verbal Expression Overall Verbal Expression: Impaired Initiation: No impairment Automatic Speech:  (WFL) Level of Generative/Spontaneous Verbalization: Sentence;Conversation (WFL) Repetition: No impairment Naming: Impairment Responsive: 76-100% accurate Confrontation: Within functional limits Divergent:  (10 animal in 52s; pt kept stating, "why can't I think of any?") Verbal Errors:  (son endorsed pt with x1 instance of semantic paraphasia this AM during conversation; not witnessed by SLP) Pragmatics: No impairment Interfering Components: Speech intelligibility (pt with decreased speech intelligibility to unfamiliar listener, ~75% at the conversation level; pt with fast rate of speech, decreased articulatory precision; benefited from cues for slow speech rate and increased vocal loudness) Written Expression Dominant Hand: Right   Oral / Motor  Oral Motor/Sensory Function Overall Oral Motor/Sensory Function: Moderate impairment Facial ROM: Reduced right Facial Symmetry: Abnormal symmetry right Lingual ROM: Within Functional Limits Lingual Symmetry: Within Functional Limits Lingual Strength: Within Functional Limits Velum: Within Functional Limits Mandible: Within Functional Limits Motor Speech Overall Motor Speech:  Impaired Respiration: Within functional limits Phonation: Normal Resonance: Within functional limits Articulation: Impaired Level of Impairment:  (  all levels) Intelligibility: Intelligibility reduced Word: 50-74% accurate Phrase: 75-100% accurate Sentence: 75-100% accurate Conversation: 75-100% accurate Motor Planning: Witnin functional limits Motor Speech Errors: Not applicable Interfering Components: Hearing loss Effective Techniques: Slow rate;Over-articulate;Increased vocal intensity     Cherrie Gauze, M.S., [facility] [phone] [place])                   Quintella Baton 10/11/2021, 1:09 PM

## 2021-10-11 NOTE — Progress Notes (Signed)
Inpatient Diabetes Program Recommendations  AACE/ADA: New Consensus Statement on Inpatient Glycemic Control (2015)  Target Ranges:  Prepandial:   less than 140 mg/dL      Peak postprandial:   less than 180 mg/dL (1-2 hours)      Critically ill patients:  140 - 180 mg/dL    Latest Reference Range & Units 10/11/21 08:03 10/11/21 12:39  Glucose-Capillary 70 - 99 mg/dL 191 (H) 218 (H)     Home DM Meds: Trulicity 1.5 mg Qweek        Amaryl 4 mg daily  Current Orders: Novolog 0-9 units TID AC + HS   MD- Note home DM meds on hold.  Please consider starting Novolog 3 units TID with meals for meal coverage Hold if pt eats <50% of meal, Hold if pt NPO   --Will follow patient during hospitalization--  Wyn Quaker RN, MSN, CDE Diabetes Coordinator Inpatient Glycemic Control Team Team Pager: [phone] (8a-5p)

## 2021-10-11 NOTE — Evaluation (Signed)
Occupational Therapy Evaluation Patient Details Name: Casey Adkins MRN: [id number] DOB: [birth year] Today's Date: 10/11/2021   History of Present Illness 87 year old past history of bladder cancer diabetes prior stroke with no residual deficits hypertension essential tremor presenting for evaluation of sudden onset of slurred speech and right facial droop, outside the window for IV tPA.  He does continue to have right upper motor neuron pattern weakness and dysarthria along with dysphagia.  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   Clinical Impression   Patient presenting with decreased Ind in self care, balance, functional mobility/transfers, endurance, and safety awareness. Patient is very [place] and with expressive difficulties this session. His wife is present to confirm baseline and home set up. Pt performs mobility and self care tasks at min hand held assist overall. Pt does have decreased coordination, dexterity, and strength in R UE but it is functional at this time. Family reports they are available to assist him at this level at discharge. Patient will benefit from acute OT to increase overall independence in the areas of ADLs, functional mobility, and safety awareness in order to safely discharge home with family.      Recommendations for follow up therapy are one component of a multi-disciplinary discharge planning process, led by the attending physician.  Recommendations may be updated based on patient status, additional functional criteria and insurance authorization.   Follow Up Recommendations  Home health OT    Assistance Recommended at Discharge Intermittent Supervision/Assistance  Functional Status Assessment  Patient has had a recent decline in their functional status and demonstrates the ability to make significant improvements in function in a reasonable and  predictable amount of time.  Equipment Recommendations  Other (comment) (RW)       Precautions / Restrictions Precautions Precautions: Fall      Mobility Bed Mobility Overal bed mobility: Needs Assistance Bed Mobility: Supine to Sit;Sit to Supine     Supine to sit: Min assist Sit to supine: Min assist   General bed mobility comments: for trunk support supine <>sit    Transfers Overall transfer level: Needs assistance Equipment used: 1 person hand held assist Transfers: Sit to/from Stand;Bed to chair/wheelchair/BSC Sit to Stand: Min assist     Step pivot transfers: Min assist            Balance Overall balance assessment: Needs assistance Sitting-balance support: Feet supported;Bilateral upper extremity supported Sitting balance-Leahy Scale: Good     Standing balance support: Reliant on assistive device for balance;Single extremity supported;During functional activity Standing balance-Leahy Scale: Fair Standing balance comment: min A for standing balance                           ADL either performed or assessed with clinical judgement   ADL Overall ADL's : Needs assistance/impaired     Grooming: Wash/dry hands;Standing;Min guard                   Toilet Transfer: Minimal Teacher, English as a foreign language;Ambulation           Functional mobility during ADLs: Minimal assistance       Vision Patient Visual Report: No change from baseline              Pertinent Vitals/Pain Pain Assessment: No/denies pain     Hand Dominance Right   Extremity/Trunk Assessment Upper Extremity Assessment Upper Extremity Assessment:  RUE deficits/detail;Generalized weakness RUE Deficits / Details: decreased coordination and deficits. 3+/5 gross strength   Lower Extremity Assessment Lower Extremity Assessment: Defer to PT evaluation       Communication Communication Communication: HOH   Cognition Arousal/Alertness: Awake/alert Behavior During  Therapy: Flat affect Overall Cognitive Status: Within Functional Limits for tasks assessed                                                  Home Living Family/patient expects to be discharged to:: Private residence Living Arrangements: Spouse/significant other Available Help at Discharge: Family;Available 24 hours/day Type of Home: House Home Access: Stairs to enter [employer] of Steps: 4 Entrance Stairs-Rails: Right;Left Home Layout: One level;Other (Comment) (2 steps down/up for den addition. There is a rail available.)     Bathroom Shower/Tub: Occupational psychologist: Standard     Home Equipment: Other (comment);Grab bars - tub/shower   Additional Comments: pt sometime will use a "walking stick" out in the yard.      Prior Functioning/Environment Prior Level of Function : Independent/Modified Independent             Mobility Comments: Pt reports independent with mobilty with use of "walking stick" in yard. ADLs Comments: Pt reports being independent in self care tasks at home.        OT Problem List: Decreased strength;Decreased coordination;Decreased range of motion;Decreased activity tolerance;Decreased safety awareness;Impaired balance (sitting and/or standing);Decreased knowledge of use of DME or AE;Impaired UE functional use      OT Treatment/Interventions: Self-care/ADL training;Therapeutic exercise;Therapeutic activities;Energy conservation;DME and/or AE instruction;Patient/family education;Balance training;Neuromuscular education    OT Goals(Current goals can be found in the care plan section) Acute Rehab OT Goals Patient Stated Goal: to go home OT Goal Formulation: With patient/family Time For Goal Achievement: 10/25/21 Potential to Achieve Goals: Good ADL Goals Pt Will Perform Grooming: with supervision;standing Pt Will Perform Lower Body Dressing: with supervision;sit to/from stand Pt Will Transfer to Toilet:  with supervision;ambulating Pt Will Perform Toileting - Clothing Manipulation and hygiene: with supervision;sit to/from stand Pt/caregiver will Perform Home Exercise Program: Right Upper extremity;Increased ROM;Increased strength;With Supervision;With written HEP provided;With theraputty  OT Frequency: Min 3X/week   Barriers to D/C:    none known at this time          AM-PAC OT "6 Clicks" Daily Activity     Outcome Measure Help from another person eating meals?: A Little Help from another person taking care of personal grooming?: A Little Help from another person toileting, which includes using toliet, bedpan, or urinal?: A Little Help from another person bathing (including washing, rinsing, drying)?: A Little Help from another person to put on and taking off regular upper body clothing?: A Little Help from another person to put on and taking off regular lower body clothing?: A Little 6 Click Score: 18   End of Session Nurse Communication: Mobility status  Activity Tolerance: Patient tolerated treatment well Patient left: in bed;with call bell/phone within reach;with bed alarm set  OT Visit Diagnosis: Unsteadiness on feet (R26.81);Repeated falls (R29.6);Muscle weakness (generalized) (M62.81);Hemiplegia and hemiparesis Hemiplegia - Right/Left: Right Hemiplegia - dominant/non-dominant: Dominant                Time: 1540-0867 OT Time Calculation (min): 18 min Charges:  OT General Charges $OT Visit: 1 Visit OT Evaluation $OT  Eval Moderate Complexity: 1 Mod OT Treatments $Self Care/Home Management : 8-22 mins  Darleen Crocker, MS, OTR/L , [facility] ascom [phone]  10/11/21, 10:58 AM

## 2021-10-11 NOTE — Telephone Encounter (Signed)
Called patients room to let them know monitor would be mailed to address on file. Advised that he will wear monitor 2 weeks then return in mail. He verbalized understanding with no further questions at this time.

## 2021-10-12 NOTE — Telephone Encounter (Signed)
Requested clarification from provider on follow up appointment. Provider would like patient to follow up after his zio monitor has been worn.   Rise Mu, PA-C  2 hours ago (11:24 AM)   With MD to review the monitor results. Needs to be MD to establish care as we have never seen him before.

## 2021-10-12 NOTE — Telephone Encounter (Signed)
Appt scheduled  With Cardiology Ida Rogue, MD) 11/12/2021 at 11:00 AM

## 2021-10-15 ENCOUNTER — Ambulatory Visit: Payer: Medicare Other | Admitting: Cardiovascular Disease

## 2021-11-01 ENCOUNTER — Other Ambulatory Visit: Payer: Self-pay | Admitting: Internal Medicine

## 2021-11-01 DIAGNOSIS — R1310 Dysphagia, unspecified: Secondary | ICD-10-CM

## 2021-11-12 ENCOUNTER — Ambulatory Visit: Payer: Medicare Other | Admitting: Cardiovascular Disease

## 2021-11-13 ENCOUNTER — Ambulatory Visit
Admission: RE | Admit: 2021-11-13 | Discharge: 2021-11-13 | Disposition: A | Discharge status: Home / Self Care | Payer: Medicare Other | Source: Ambulatory Visit | Attending: Internal Medicine | Admitting: Internal Medicine

## 2021-11-13 ENCOUNTER — Other Ambulatory Visit: Payer: Self-pay

## 2021-11-13 DIAGNOSIS — R1312 Dysphagia, oropharyngeal phase: Secondary | ICD-10-CM | POA: Diagnosis present

## 2021-11-13 DIAGNOSIS — R1310 Dysphagia, unspecified: Secondary | ICD-10-CM | POA: Diagnosis present

## 2021-11-13 NOTE — Therapy (Signed)
[facility], [place], [address] Phone: [phone]   Fax:     Modified Barium Swallow  Patient Details  Name: Casey Adkins MRN: [id number] Date of Birth: 03/26/1934 No data recorded  Encounter Date: 11/13/2021   End of Session - 11/13/21 1654     Visit Number 1    Number of Visits 1    Date for SLP Re-Evaluation 11/13/21    SLP Start Time 3762    SLP Stop Time  1315    SLP Time Calculation (min) 45 min    Activity Tolerance Patient tolerated treatment well              There were no vitals filed for this visit.   Subjective Assessment - 11/13/21 1327     Subjective Pt drooling from R side of mouth: "That's a family tradition" (per wife, premorbid prior to CVA)    Currently in Pain? No/denies              Objective Swallowing Evaluation: Type of Study: MBS-Modified Barium Swallow Study   Patient Details  Name: Casey Adkins MRN: [id number] Date of Birth: 1934/01/11  Today's Date: 11/13/2021 Time: SLP Start Time (ACUTE ONLY): 4 -SLP Stop Time (ACUTE ONLY): 0737  SLP Time Calculation (min) (ACUTE ONLY): 45 min   Past Medical History:  Past Medical History:  Diagnosis Date   Allergic rhinitis    Arthritis    Cancer ([place])    Bladder Cancer   Cancer ([place])    Prostate Cancer   Diabetes mellitus without complication ([place])    Diverticulosis    Dysphagia    GERD (gastroesophageal reflux disease)    History of kidney stones    h/o   Hypertension    Stroke ([place]) 2006   possible light stroke-no deficits   Past Surgical History:  Past Surgical History:  Procedure Laterality Date   CYSTOSCOPY W/ RETROGRADES Bilateral 06/07/2019   Procedure: CYSTOSCOPY WITH RETROGRADE PYELOGRAM;  Surgeon: Hollice Espy, MD;  Location: [facility] ORS;  Service: Urology;  Laterality: Bilateral;   CYSTOSCOPY WITH BIOPSY N/A 06/07/2019   Procedure: CYSTOSCOPY WITH Bladder BIOPSY;  Surgeon:  Hollice Espy, MD;  Location: [facility] ORS;  Service: Urology;  Laterality: N/A;   ESOPHAGOGASTRODUODENOSCOPY N/A 04/21/2015   Procedure: ESOPHAGOGASTRODUODENOSCOPY (EGD);  Surgeon: Hulen Luster, MD;  Location: [facility] ENDOSCOPY;  Service: Gastroenterology;  Laterality: N/A;   LITHOTRIPSY     SINUS SURGERY WITH INSTATRAK  2008   STAPEDES SURGERY Left age 66   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 male referred for MBS by his PCP Dr. Emily Filbert. Pt admitted 10/10/21- 10/11/21 for CVA. 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 Unable to obtain MRI due to ear implant. Hx of esophageal dysphagia requiring dilation in the past; reports 1 year hx of worsening dysphagia prior to CVA. Neurology noted right upper motor neuron pattern weakness, suspected small vessel lacunar infarction. 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 Wife assists with history given pt's dysarthria, aphasia.   Subjective: reports coughing when he takes a big sip of liquid   Assessment / Plan / Recommendation  Clinical Impressions 11/13/2021  Clinical Impression Patient presents with mild oropharyngeal dysphagia with motor and sensory deficits. Oral stage is characterized by  adequate bolus hold but slow, disorganized lingual transport and right sided CN VII weakness with resulting mild-moderate residue in R lateral sulcus and along base of tongue. This spills to the valleculae post-swallow and is cleared with second swallow. Swallow initiation is delayed to the level of the pyriform sinuses with thin liquids. For small single sips, pt has transient shallow larygneal penetration without aspiration, but with larger/consecutive sips silent aspiration occurs during the swallow due to delayed laryngeal closure. Base of tongue retraction is mildly  reduced (mild base of tongue residue), however hyolaryngeal excursion and pharyngeal constriction appear within functional limits. Amplitude/duration of pharyngoesophageal segment opening is WFL. It was difficult to visual the cervical esophagus due to position of pt's shoulder, however during the swallow was able to observe what appears to be mild residue from previous swallows in the proximal esophagus. An esophageal sweep was performed in anterior-posterior view with nectar thick liquids, and was noted for tertiary contractions, slower clearance throught the distal esophagus per radiology NP. Recommend pt consume regular solids, thin liquids using precautions including: slow rate, small bites and sips, alternate solids and liquids, clear oral cavity before taking subsequent bites/sips. Oral strengthening/coordination exercises may be beneficial. Discussed swallowing function in context of chronic esophageal dysphagia and strategies to minimize symptoms and reduce aspiration risk. Imaging and recommendations in handout form were reviewed with pt and his wife after the study. Continued follow-up with SLP is recommended; pt has outpatient evaluation for aphasia pending.  SLP Visit Diagnosis Dysphagia, oropharyngeal phase (R13.12)  Impact on safety and function Mild aspiration risk      Treatment Recommendations 11/13/2021  Treatment Recommendations Defer treatment plan to f/u with SLP     Prognosis 10/11/2021  Prognosis for Safe Diet Advancement Good  Barriers to Reach Goals --  Barriers/Prognosis Comment --    Diet Recommendations 11/13/2021  SLP Diet Recommendations Regular solids;Thin liquid  Liquid Administration via Cup;No straw  Medication Administration Whole meds with puree  Compensations Slow rate;Small sips/bites;Minimize environmental distractions;Follow solids with liquid  Postural Changes Seated upright at 90 degrees      Other Recommendations 11/13/2021  Recommended Consults  Other (Comment)  Oral Care Recommendations Oral care BID  Other Recommendations --  Follow Up Recommendations Outpatient SLP  Assistance recommended at discharge --  Functional Status Assessment --    Frequency and Duration  10/11/2021  Speech Therapy Frequency (ACUTE ONLY) min 2x/week  Treatment Duration --      Oral Phase 11/13/2021  Oral Phase Impaired  Oral - Pudding Teaspoon --  Oral - Pudding Cup --  Oral - Honey Teaspoon --  Oral - Honey Cup --  Oral - Nectar Teaspoon Right pocketing in lateral sulci;Lingual/palatal residue;Delayed oral transit;Decreased bolus cohesion  Oral - Nectar Cup Right pocketing in lateral sulci;Lingual/palatal residue;Decreased bolus cohesion;Delayed oral transit  Oral - Nectar Straw --  Oral - Thin Teaspoon Right pocketing in lateral sulci;Lingual/palatal residue;Delayed oral transit;Decreased bolus cohesion  Oral - Thin Cup Right pocketing in lateral sulci;Lingual/palatal residue;Delayed oral transit;Decreased bolus cohesion  Oral - Thin Straw --  Oral - Puree Right pocketing in lateral sulci;Lingual/palatal residue;Delayed oral transit;Decreased bolus cohesion  Oral - Mech Soft Right pocketing in lateral sulci;Lingual/palatal residue;Delayed oral transit;Decreased bolus cohesion;Impaired mastication  Oral - Regular --  Oral - Multi-Consistency --  Oral - Pill --  Oral Phase - Comment --    Pharyngeal Phase 11/13/2021  Pharyngeal Phase Impaired  Pharyngeal- Pudding Teaspoon --  Pharyngeal --  Pharyngeal- Pudding Cup --  Pharyngeal --  Pharyngeal- Honey Teaspoon --  Pharyngeal --  Pharyngeal- Honey Cup --  Pharyngeal --  Pharyngeal- Nectar Teaspoon Delayed swallow initiation-vallecula;Reduced tongue base retraction  Pharyngeal Material does not enter airway  Pharyngeal- Nectar Cup Delayed swallow initiation-vallecula;Reduced tongue base retraction  Pharyngeal Material does not enter airway  Pharyngeal- Nectar Straw --  Pharyngeal --   Pharyngeal- Thin Teaspoon Delayed swallow initiation-pyriform sinuses;Reduced tongue base retraction  Pharyngeal Material enters airway, remains ABOVE vocal cords then ejected out  Pharyngeal- Thin Cup Delayed swallow initiation-pyriform sinuses;Penetration/Aspiration during swallow;Reduced tongue base retraction;Trace aspiration  Pharyngeal Material enters airway, passes BELOW cords without attempt by patient to eject out (silent aspiration);Material enters airway, remains ABOVE vocal cords then ejected out  Pharyngeal- Thin Straw --  Pharyngeal --  Pharyngeal- Puree Delayed swallow initiation-vallecula;Reduced tongue base retraction  Pharyngeal Material does not enter airway  Pharyngeal- Mechanical Soft Delayed swallow initiation-vallecula;Reduced tongue base retraction  Pharyngeal Material does not enter airway  Pharyngeal- Regular --  Pharyngeal --  Pharyngeal- Multi-consistency --  Pharyngeal --  Pharyngeal- Pill --  Pharyngeal --  Pharyngeal Comment --     Cervical Esophageal Phase  11/13/2021  Cervical Esophageal Phase WFL  Pudding Teaspoon --  Pudding Cup --  Honey Teaspoon --  Honey Cup --  Nectar Teaspoon --  Nectar Cup --  Nectar Straw --  Thin Teaspoon --  Thin Cup --  Thin Straw --  Puree --  Mechanical Soft --  Regular --  Multi-consistency --  Pill --  Cervical Esophageal Comment limited view, question slow clearance, intermittent residue noted from previous swallows below level of cricopharyngeus   Deneise Lever, MS, CCC-SLP Speech-Language Pathologist   Aliene Altes 11/13/2021, 4:56 PM                                                           Patient will benefit from skilled therapeutic intervention in order to improve the following deficits and impairments:   Dysphagia, oropharyngeal phase  Dysphagia, unspecified type - Plan: DG SWALLOW FUNC OP MEDICARE SPEECH PATH, DG SWALLOW FUNC OP MEDICARE SPEECH  PATH        Problem List Patient Active Problem List   Diagnosis Date Noted   Overweight (BMI 25.0-29.9) 10/11/2021   Hypertriglyceridemia 10/11/2021   Stroke ([place]) 10/10/2021   Diabetes mellitus without complication ([place]) 58/85/0277   HTN (hypertension) 10/10/2021   Pyuria 12/25/2016   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   Medicare annual wellness visit, initial 07/17/2016   B12 deficiency 07/17/2016   History of CVA (cerebrovascular accident) without residual deficits 05/14/2016   PIN III (prostatic intraepithelial neoplasia III) 04/11/2015   Personal history of other malignant neoplasm of skin 09/16/2014   History of nonmelanoma skin cancer 09/16/2014   History of colonic polyps 08/23/2014   Diverticulosis of large intestine 08/23/2014   Benign hematuria 07/20/2014   Type 2 diabetes mellitus ([place]) 07/04/2014   Malignant neoplasm of bladder ([place]) 07/04/2014   DM (diabetes mellitus) type II controlled with renal manifestation ([place]) 07/04/2014   Benign essential hypertension 07/04/2014   Urge incontinence 12/29/2013   Malignant neoplasm of lateral wall of urinary bladder ([place]) 09/09/2012   Incomplete emptying of bladder 09/09/2012   Enlarged prostate with lower urinary tract symptoms (LUTS) 09/09/2012   Elevated prostate specific antigen (PSA) 09/09/2012   Chronic  prostatitis 09/09/2012   Carcinoma in situ of prostate 09/09/2012   Kidney stone 09/09/2012   Benign prostatic hyperplasia with urinary obstruction 09/09/2012    Aliene Altes, CCC-SLP 11/13/2021, 4:55 PM  [facility] [place], [place], [address] Phone: [phone]   Fax:     Name: Casey Adkins MRN: [id number] Date of Birth: 11-28-1933

## 2021-11-20 ENCOUNTER — Ambulatory Visit: Payer: Medicare Other | Attending: Internal Medicine | Admitting: Speech Pathology

## 2021-11-20 ENCOUNTER — Other Ambulatory Visit: Payer: Self-pay

## 2021-11-20 DIAGNOSIS — R4701 Aphasia: Secondary | ICD-10-CM | POA: Diagnosis not present

## 2021-11-20 DIAGNOSIS — R41841 Cognitive communication deficit: Secondary | ICD-10-CM | POA: Diagnosis present

## 2021-11-20 DIAGNOSIS — R1312 Dysphagia, oropharyngeal phase: Secondary | ICD-10-CM | POA: Diagnosis present

## 2021-11-20 DIAGNOSIS — R471 Dysarthria and anarthria: Secondary | ICD-10-CM | POA: Diagnosis present

## 2021-11-20 NOTE — Patient Instructions (Signed)
In Speech Therapy, we will make goals to work on the following:  Wordfinding (and what to do when you get stuck)  Speech intelligibility (improve how clear and loud your speech and voice sound)  Swallowing safely (using strategies to keep you from getting strangled)  We might do further assessment of your thinking/cognition at a later time

## 2021-11-21 NOTE — Therapy (Signed)
[place] MAIN [facility] SERVICES [address] [place], [place], [address] Phone: [phone]   Fax:  [phone]  Speech Language Pathology Evaluation  Patient Details  Name: Casey Adkins MRN: [id number] Date of Birth: [birth year] Referring Provider (SLP): Rusty Aus, MD   Encounter Date: 11/20/2021   End of Session - 11/20/21 1257     Visit Number 1    Number of Visits 25    Date for SLP Re-Evaluation 02/18/22    SLP Start Time 0956    SLP Stop Time  1105    SLP Time Calculation (min) 69 min    Activity Tolerance Patient tolerated treatment well             Past Medical History:  Diagnosis Date   Allergic rhinitis    Arthritis    Cancer ([place])    Bladder Cancer   Cancer [facility])    Prostate Cancer   Diabetes mellitus without complication ([place])    Diverticulosis    Dysphagia    GERD (gastroesophageal reflux disease)    History of kidney stones    h/o   Hypertension    Stroke [facility]) 2006   possible light stroke-no deficits    Past Surgical History:  Procedure Laterality Date   CYSTOSCOPY W/ RETROGRADES Bilateral 06/07/2019   Procedure: CYSTOSCOPY WITH RETROGRADE PYELOGRAM;  Surgeon: Hollice Espy, MD;  Location: [facility] ORS;  Service: Urology;  Laterality: Bilateral;   CYSTOSCOPY WITH BIOPSY N/A 06/07/2019   Procedure: CYSTOSCOPY WITH Bladder BIOPSY;  Surgeon: Hollice Espy, MD;  Location: [facility] ORS;  Service: Urology;  Laterality: N/A;   ESOPHAGOGASTRODUODENOSCOPY N/A 04/21/2015   Procedure: ESOPHAGOGASTRODUODENOSCOPY (EGD);  Surgeon: Hulen Luster, MD;  Location: [facility] ENDOSCOPY;  Service: Gastroenterology;  Laterality: N/A;   LITHOTRIPSY     SINUS SURGERY WITH INSTATRAK  2008   STAPEDES SURGERY Left age 13   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   Subjective Assessment - 11/20/21 1208     Subjective "I start out with a sentence and  I..."    Patient is accompained by: Family member   spouse   Currently in Pain? No/denies                SLP Evaluation [facility] - 11/20/21 1208       SLP Visit Information   SLP Received On 11/20/21    Referring Provider (SLP) Rusty Aus, MD    Onset Date 10/10/21    Medical Diagnosis CVA      Subjective   Patient/Family Stated Goal better communication      General Information   HPI Casey Adkins is an 87 y.o. male referred for MBS by his PCP Dr. Emily Filbert. Pt admitted 10/10/21- 10/11/21 for CVA. 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 Unable to obtain MRI due to ear implant. Hx of esophageal dysphagia requiring dilation in the past; reports 1 year hx of worsening dysphagia prior to CVA. Neurology noted right upper motor neuron pattern weakness, suspected small vessel lacunar infarction. 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 Wife assists with history given pt's dysarthria, aphasia.    Behavioral/Cognition alert, pleasant    Mobility Status ambulated unassisted      Balance Screen   Has the patient fallen in the  past 6 months Yes    How many times? 1    Has the patient had a decrease in activity level because of a fear of falling?  No    Is the patient reluctant to leave their home because of a fear of falling?  No      Prior Functional Status   Cognitive/Linguistic Baseline Within functional limits    Type of Home House     Lives With Spouse    Available Support Family    Vocation Retired      Associate Professor   Overall Cognitive Status --   Not formally assessed; pt/wife deny significant changes, may assess further     Auditory Comprehension   Overall Auditory Comprehension Appears within functional limits for tasks assessed   needs repetition at times due to hearing loss   Yes/No Questions Within Functional Limits    Commands Within  Functional Limits    Conversation Moderately complex    Interfering Components Hearing   bilateral BTEs   EffectiveTechniques Repetition;Increased volume      Visual Recognition/Discrimination   Discrimination Within Function Limits      Reading Comprehension   Reading Status Within funtional limits      Expression   Primary Mode of Expression Verbal      Verbal Expression   Overall Verbal Expression Impaired    Initiation No impairment    Automatic Speech Name;Social Response    Level of Generative/Spontaneous Verbalization Conversation    Repetition No impairment    Naming Impairment    Responsive 76-100% accurate    Confrontation 75-100% accurate    Divergent Other (comment)   8 animals in 60 seconds (paraphasia/perseveration: "tree x2")   Verbal Errors Semantic paraphasias    Pragmatics No impairment    Other Verbal Expression Comments umbrella/kite, coke/wine with picture description      Written Expression   Dominant Hand Right    Written Expression Not tested      Oral Motor/Sensory Function   Overall Oral Motor/Sensory Function Impaired    Labial ROM Reduced right    Labial Symmetry Abnormal symmetry right    Labial Strength Reduced Right    Labial Sensation Within Functional Limits    Labial Coordination WFL    Lingual Symmetry Within Functional Limits    Lingual Strength Reduced Right   weaker left lateralization   Lingual Sensation Within Functional Limits    Lingual Coordination Reduced    Facial ROM Reduced right    Facial Symmetry Right droop    Velum Within Functional Limits    Mandible Within Functional Limits      Motor Speech   Overall Motor Speech Impaired    Respiration Impaired    Level of Impairment Sentence    Phonation Hoarse;Low vocal intensity    Resonance --   hypernasal at time, bizarre resonatory quality   Articulation Impaired    Level of Impairment Phrase    Intelligibility Intelligibility reduced    Conversation 75-100% accurate    85% with context in quiet environment   Motor Planning Witnin functional limits      Standardized Assessments   Standardized Assessments  Western Aphasia Battery revised;Boston Naming Test-2nd edition    Boston Naming Test-2nd edition  57/60    Western Aphasia Battery revised  see results below               Western Aphasia Battery- Revised  Spontaneous Speech  Information content               9/10                                            Fluency                                 6/10                                          Comprehension     Yes/No questions                 57/60                                           Auditory Word Recognition  60/60                                Sequential Commands       70/80                              Repetition                             92/100                                        Naming    Object Naming                     60/60                                           Word Fluency                        8/20                                            Sentence Completion          10/10                                             Responsive Speech              10/10                                         Aphasia Quotient  84.7/100         Pt's severity rating was mild as indicated by an Aphasia Quotient of 84.7 (0-25=very severe, 26-50=severe, 51-75=moderate, 76 and above is mild). 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 Reading and writing were not formally assessed. Pt's verbal expression is characterized by shortened but fluent utterances with hesitation, occasional wordfinding difficulty and occasional semantic/verbal paraphasias.     Dysarthria Evaluation     Alternating Motion Rate: WNL for /p/ (30-35 repetitions/ 5 seconds average for /p/)  Sequential Motion  Rate: imprecise, discoordinated lingual consonants, blurred   Respiration: reduced breath support, short breath groups   Phonation   Vocal quality: hoarse, low vocal intensity  Maximum phonation time for sustained ah: 9.7 (strained)   Average fundamental frequency during sustained ah: 194 Hz    Highest dynamic pitch in conversational speech: 291 Hz   Lowest dynamic pitch in conversational speech: 159 Hz   Average time patient was able to sustain /s/:  5.1 seconds   Average time patient was able to sustain /z/: 5.0 seconds   s/z ratio:  (suggestive of dysfunction > 1.0) 1.02  Resonance: slight hypernasality  Articulation  Connected Speech characteristics:  imprecise consonants, rapid rate, hypernasality, low vocal intensity, hoarse vocal quality   Intelligibility: For trained listener with context in a quiet environment, intelligibility rated as approximately 80 %  Non-verbal oral apraxia: not present    SLP Education - 11/20/21 1257     Education Details plan of care, proposed therapy goals    Person(s) Educated Patient;Spouse    Methods Explanation    Comprehension Verbalized understanding              SLP Short Term Goals - 11/21/21 1443       SLP SHORT TERM GOAL #1   Title Pt will use aphasia compensations during 5-8 minutes conversation with occasional min cues.    Time 10    Period --   sessions   Status New      SLP SHORT TERM GOAL #2   Title Pt will demonstrate dysarthria HEP with rare min cues.    Time 10    Period --   sessions   Status New      SLP SHORT TERM GOAL #3   Title Patient will use compensations for dysarthria in sentence responses for >95% intelligibility.    Time 10    Period --   sessions   Status New      SLP SHORT TERM GOAL #4   Title Pt will complete further assessment of cognitive-communication PRN.    Time 10    Period --   sessions   Status New              SLP Long Term Goals - 11/21/21 1445       SLP  LONG TERM GOAL #1   Title Pt will use aphasia compensations effectively during 20 minutes mod complex conversation with modified independence.    Time 12    Period Weeks    Status New    Target Date 02/18/22      SLP LONG TERM GOAL #2   Title Pt will use aphasia compensations with modified independence to resolve communication breakdown at least 3 occasions outside of ST, per pt/spouse report.    Time 12    Period Weeks    Status New    Target Date 02/18/22  SLP LONG TERM GOAL #3   Title Patient will use compensations for dysarthria in 20 minute conversation for >95% intelligibility with no more than 2 repetitions necessary.    Time 12    Period Weeks    Status New    Target Date 02/18/22      SLP LONG TERM GOAL #4   Title Patient will demonstrate swallowing precautions independently x3 sessions with no overt s/sx aspiration.    Time 12    Period Weeks    Status New    Target Date 02/18/22              Plan - 11/20/21 1258     Clinical Impression Statement Patient presents with moderate dysarthria, mild aphasia, and mild oropharyngeal dysphagia s/p CVA. Speech/voice is characterized by hoarse vocal quality, reduced breath support, imprecise lingual consonants and bizarre resonatory quality. Pt's receptive language is intact, with occasional repetitions necessary due to hearing loss vs language impairment. Confrontation naming is WNL, however fluency is impaired in conversation and with picture description, with hesitations and occasional paraphasias noted. Patient/wife report he becomes frustrated when he cannot describe something or forgets a word at home. Swallowing was not assessed today, however pt had MBS on 11/13/21 with findings of mild oropharyngeal dysphagia. I recommend skilled ST to improve intelligibility, verbal expression and swallowing safety.    Speech Therapy Frequency 2x / week    Duration 12 weeks    Treatment/Interventions Aspiration precaution  training;Environmental controls;Language facilitation;Cueing hierarchy;Oral motor exercises;SLP instruction and feedback;Pharyngeal strengthening exercises;Compensatory techniques;Cognitive reorganization;Functional tasks;Compensatory strategies;Diet toleration management by SLP;Internal/external aids;Multimodal communcation approach;Patient/family education    Potential to Achieve Goals Good    Consulted and Agree with Plan of Care Patient;Family member/caregiver             Patient will benefit from skilled therapeutic intervention in order to improve the following deficits and impairments:   Aphasia  Dysarthria and anarthria  Dysphagia, oropharyngeal phase  Cognitive communication deficit    Problem List Patient Active Problem List   Diagnosis Date Noted   Overweight (BMI 25.0-29.9) 10/11/2021   Hypertriglyceridemia 10/11/2021   Stroke ([place]) 10/10/2021   Diabetes mellitus without complication ([place]) 05/24/1600   HTN (hypertension) 10/10/2021   Pyuria 12/25/2016   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   Medicare annual wellness visit, initial 07/17/2016   B12 deficiency 07/17/2016   History of CVA (cerebrovascular accident) without residual deficits 05/14/2016   PIN III (prostatic intraepithelial neoplasia III) 04/11/2015   Personal history of other malignant neoplasm of skin 09/16/2014   History of nonmelanoma skin cancer 09/16/2014   History of colonic polyps 08/23/2014   Diverticulosis of large intestine 08/23/2014   Benign hematuria 07/20/2014   Type 2 diabetes mellitus ([place]) 07/04/2014   Malignant neoplasm of bladder ([place]) 07/04/2014   DM (diabetes mellitus) type II controlled with renal manifestation ([place]) 07/04/2014   Benign essential hypertension 07/04/2014   Urge incontinence 12/29/2013   Malignant neoplasm of lateral wall of urinary bladder ([place]) 09/09/2012   Incomplete emptying of bladder 09/09/2012   Enlarged prostate with lower urinary tract symptoms (LUTS)  09/09/2012   Elevated prostate specific antigen (PSA) 09/09/2012   Chronic prostatitis 09/09/2012   Carcinoma in situ of prostate 09/09/2012   Kidney stone 09/09/2012   Benign prostatic hyperplasia with urinary obstruction 09/09/2012   Deneise Lever, MS, CCC-SLP Speech-Language Pathologist  Aliene Altes, [place] 11/21/2021, 2:47 PM  [place] MAIN [facility] SERVICES [place], [place],  [place] Phone: [phone]   Fax:  [phone]  Name: DEWAN EMOND MRN: [id number] Date of Birth: 01-06-1934

## 2021-11-29 ENCOUNTER — Ambulatory Visit: Payer: Medicare Other | Attending: Internal Medicine | Admitting: Speech Pathology

## 2021-11-29 ENCOUNTER — Other Ambulatory Visit: Payer: Self-pay

## 2021-11-29 DIAGNOSIS — R1312 Dysphagia, oropharyngeal phase: Secondary | ICD-10-CM

## 2021-11-29 DIAGNOSIS — R471 Dysarthria and anarthria: Secondary | ICD-10-CM | POA: Diagnosis present

## 2021-11-29 DIAGNOSIS — R4701 Aphasia: Secondary | ICD-10-CM

## 2021-11-29 NOTE — Patient Instructions (Signed)
Speech practice  SLOW, LOUD, OVERPRONOUNCE, PAUSE  Casey Adkins  Come here, please!  What's for lunch?  Can you help me.   I'm ready to shower  Don't go far.   Are we going to [facility] [employer] [facility]?  Two sausage biscuits, please.  Two cups of coffee.      Say each 5x Slow and Big - make each sound distinct   [place]   BUTTERCUP   CATERPILLAR   BASEBALLL PLAYER   [place]   [place]   RED LEATHER YELLOW LEATHER   [place]   THREE FREE THROWS   FLASH MESSAGE   CINNAMON LINOLEUM ALUMINUM   SLOW AND BIG - EXAGGERATE YOUR MOUTH, MAKE EACH CONSONANT

## 2021-11-29 NOTE — Therapy (Signed)
[place] MAIN [facility] SERVICES [address] [place], [place], [address] Phone: [phone]   Fax:  [phone]  Speech Language Pathology Treatment  Patient Details  Name: Casey Adkins MRN: [id number] Date of Birth: 12/29/1933 Referring Provider (SLP): Rusty Aus, MD   Encounter Date: 11/29/2021   End of Session - 11/29/21 1751     Visit Number 2    Number of Visits 25    Date for SLP Re-Evaluation 02/18/22    SLP Start Time 6    SLP Stop Time  1500    SLP Time Calculation (min) 60 min    Activity Tolerance Patient tolerated treatment well             Past Medical History:  Diagnosis Date   Allergic rhinitis    Arthritis    Cancer ([place])    Bladder Cancer   Cancer [facility])    Prostate Cancer   Diabetes mellitus without complication ([place])    Diverticulosis    Dysphagia    GERD (gastroesophageal reflux disease)    History of kidney stones    h/o   Hypertension    Stroke [facility]) 2006   possible light stroke-no deficits    Past Surgical History:  Procedure Laterality Date   CYSTOSCOPY W/ RETROGRADES Bilateral 06/07/2019   Procedure: CYSTOSCOPY WITH RETROGRADE PYELOGRAM;  Surgeon: Hollice Espy, MD;  Location: [facility] ORS;  Service: Urology;  Laterality: Bilateral;   CYSTOSCOPY WITH BIOPSY N/A 06/07/2019   Procedure: CYSTOSCOPY WITH Bladder BIOPSY;  Surgeon: Hollice Espy, MD;  Location: [facility] ORS;  Service: Urology;  Laterality: N/A;   ESOPHAGOGASTRODUODENOSCOPY N/A 04/21/2015   Procedure: ESOPHAGOGASTRODUODENOSCOPY (EGD);  Surgeon: Hulen Luster, MD;  [facility] ENDOSCOPY;  Service: Gastroenterology;  Laterality: N/A;   LITHOTRIPSY     SINUS SURGERY WITH INSTATRAK  2008   STAPEDES SURGERY Left age 65   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   Subjective Assessment - 11/29/21 1744     Subjective "I don't say much."    Patient is  accompained by: Family member   spouse   Currently in Pain? No/denies                   ADULT SLP TREATMENT - 11/29/21 1744       General Information   Behavior/Cognition Alert;Cooperative;Pleasant mood    HPI Casey Adkins is an 88 y.o. male referred for MBS by his PCP Dr. Emily Filbert. Pt admitted 10/10/21- 10/11/21 for CVA. 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 Unable to obtain MRI due to ear implant. Hx of esophageal dysphagia requiring dilation in the past; reports 1 year hx of worsening dysphagia prior to CVA. Neurology noted right upper motor neuron pattern weakness, suspected small vessel lacunar infarction. 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 Wife assists with history given pt's dysarthria, aphasia.      Treatment Provided   Treatment provided Cognitive-Linquistic;Dysphagia      Dysphagia Treatment   Temperature Spikes Noted No    Respiratory Status Room air    Oral Cavity - Dentition Adequate natural dentition    Treatment Methods Skilled observation    Patient observed directly with PO's Yes    Type of PO's observed Thin liquids    Feeding Able  to feed self    Liquids provided via Cup    Type of cueing Verbal    Amount of cueing Modified independent    Other treatment/comments Patient reports he always coughs "on the last sip" of his drink. Reviewed MBS with pt as well as swallow precautions. Pt sipped water from cup and agreed with SLP that he may be more careless with last sip from a cup or altering his position. With head neutral/slightly forward and single small cup sips, no overt s/sx aspiration. Pt agreed he would monitor for this at home.      Pain Assessment   Pain Assessment No/denies pain      Cognitive-Linquistic Treatment   Treatment focused on Dysarthria;Aphasia    Skilled Treatment Initiated training in compensations  for dysarthria (Slow, Loud, Overarticulate, Pause). Patient returned demonstration with usual mod cues with use of mirror for self-assessment. Worked with pt to generate list of personally-relevant words/phrases. Pt imitated multi-syllabic words and personally relevant phrases with usual mod cues, primarily for slower rate, pausing between words. On 4 occasions, pt with anomia when describing preferred restaurants, food items. Exhibits frustration with this, however was responsive to cues for description and was able to retrieve word 75% accuracy using this strategy.      Assessment / Recommendations / Plan   Plan Continue with current plan of care              SLP Education - 11/29/21 1750     Education Details positioning when eating/drinking, swallow precautions    Person(s) Educated Patient;Spouse    Methods Explanation    Comprehension Verbalized understanding              SLP Short Term Goals - 11/21/21 1443       SLP SHORT TERM GOAL #1   Title Pt will use aphasia compensations during 5-8 minutes conversation with occasional min cues.    Time 10    Period --   sessions   Status New      SLP SHORT TERM GOAL #2   Title Pt will demonstrate dysarthria HEP with rare min cues.    Time 10    Period --   sessions   Status New      SLP SHORT TERM GOAL #3   Title Patient will use compensations for dysarthria in sentence responses for >95% intelligibility.    Time 10    Period --   sessions   Status New      SLP SHORT TERM GOAL #4   Title Pt will complete further assessment of cognitive-communication PRN.    Time 10    Period --   sessions   Status New              SLP Long Term Goals - 11/21/21 1445       SLP LONG TERM GOAL #1   Title Pt will use aphasia compensations effectively during 20 minutes mod complex conversation with modified independence.    Time 12    Period Weeks    Status New    Target Date 02/18/22      SLP LONG TERM GOAL #2   Title Pt  will use aphasia compensations with modified independence to resolve communication breakdown at least 3 occasions outside of ST, per pt/spouse report.    Time 12    Period Weeks    Status New    Target Date 02/18/22      SLP LONG TERM GOAL #3  Title Patient will use compensations for dysarthria in 20 minute conversation for >95% intelligibility with no more than 2 repetitions necessary.    Time 12    Period Weeks    Status New    Target Date 02/18/22      SLP LONG TERM GOAL #4   Title Patient will demonstrate swallowing precautions independently x3 sessions with no overt s/sx aspiration.    Time 12    Period Weeks    Status New    Target Date 02/18/22              Plan - 11/29/21 1751     Clinical Impression Statement Patient presents with moderate dysarthria, mild aphasia, and mild oropharyngeal dysphagia s/p CVA. Speech/voice is characterized by hoarse vocal quality, reduced breath support, imprecise lingual consonants and bizarre resonatory quality. Initiated training in dysarthria, anomia compensations today. Patient/wife report he becomes frustrated when he cannot describe something or forgets a word at home. Pt reports coughing on last sip of drink at home; reviewed swallow strategies and MBS results with him today; no overt signs of aspiration when following precautions. I recommend skilled ST to improve intelligibility, verbal expression and swallowing safety.    Speech Therapy Frequency 2x / week    Duration 12 weeks    Treatment/Interventions Aspiration precaution training;Environmental controls;Language facilitation;Cueing hierarchy;Oral motor exercises;SLP instruction and feedback;Pharyngeal strengthening exercises;Compensatory techniques;Cognitive reorganization;Functional tasks;Compensatory strategies;Diet toleration management by SLP;Internal/external aids;Multimodal communcation approach;Patient/family education    Potential to Achieve Goals Good    Consulted and  Agree with Plan of Care Patient;Family member/caregiver             Patient will benefit from skilled therapeutic intervention in order to improve the following deficits and impairments:   Aphasia  Dysarthria and anarthria  Dysphagia, oropharyngeal phase    Problem List Patient Active Problem List   Diagnosis Date Noted   Overweight (BMI 25.0-29.9) 10/11/2021   Hypertriglyceridemia 10/11/2021   Stroke ([place]) 10/10/2021   Diabetes mellitus without complication ([place]) 77/93/9030   HTN (hypertension) 10/10/2021   Pyuria 12/25/2016   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) 07/04/2014   DM (diabetes mellitus) type II controlled with renal manifestation ([place]) 07/04/2014   Benign essential hypertension 07/04/2014   Urge incontinence 12/29/2013   Malignant neoplasm of lateral wall of urinary bladder ([place]) 09/09/2012   Incomplete emptying of bladder 09/09/2012   Enlarged prostate with lower urinary tract symptoms (LUTS) 09/09/2012   Elevated prostate specific antigen (PSA) 09/09/2012   Chronic prostatitis 09/09/2012   Carcinoma in situ of prostate 09/09/2012   Kidney stone 09/09/2012   Benign prostatic hyperplasia with urinary obstruction 09/09/2012   Deneise Lever, [place], CCC-SLP Speech-Language Pathologist  Aliene Altes, [place] 11/29/2021, 5:52 PM  [place] MAIN [facility] SERVICES [address]  [place], [place], [address] Phone: [phone]   Fax:  [phone]   Name: Casey Adkins MRN: [id number] Date of Birth: [birth year]

## 2021-12-17 ENCOUNTER — Ambulatory Visit: Payer: Medicare Other | Admitting: Speech Pathology

## 2021-12-17 ENCOUNTER — Other Ambulatory Visit: Payer: Self-pay

## 2021-12-17 DIAGNOSIS — R4701 Aphasia: Secondary | ICD-10-CM

## 2021-12-17 DIAGNOSIS — R471 Dysarthria and anarthria: Secondary | ICD-10-CM

## 2021-12-18 NOTE — Therapy (Signed)
[place] MAIN [facility] SERVICES [address] [place], [place], [address] Phone: [phone]   Fax:  [phone]  Speech Language Pathology Treatment  Patient Details  Name: Casey Adkins MRN: [id number] Date of Birth: 1933-11-28 Referring Provider (SLP): Rusty Aus, MD   Encounter Date: 12/17/2021   End of Session - 12/18/21 0829     Visit Number 3    Number of Visits 25    Date for SLP Re-Evaluation 02/18/22    SLP Start Time 1602    SLP Stop Time  1700    SLP Time Calculation (min) 58 min    Activity Tolerance Patient tolerated treatment well             Past Medical History:  Diagnosis Date   Allergic rhinitis    Arthritis    Cancer ([place])    Bladder Cancer   Cancer [facility])    Prostate Cancer   Diabetes mellitus without complication ([place])    Diverticulosis    Dysphagia    GERD (gastroesophageal reflux disease)    History of kidney stones    h/o   Hypertension    Stroke [facility]) 2006   possible light stroke-no deficits    Past Surgical History:  Procedure Laterality Date   CYSTOSCOPY W/ RETROGRADES Bilateral 06/07/2019   Procedure: CYSTOSCOPY WITH RETROGRADE PYELOGRAM;  Surgeon: Hollice Espy, MD;  Location: [facility] ORS;  Service: Urology;  Laterality: Bilateral;   CYSTOSCOPY WITH BIOPSY N/A 06/07/2019   Procedure: CYSTOSCOPY WITH Bladder BIOPSY;  Surgeon: Hollice Espy, MD;  Location: [facility] ORS;  Service: Urology;  Laterality: N/A;   ESOPHAGOGASTRODUODENOSCOPY N/A 04/21/2015   Procedure: ESOPHAGOGASTRODUODENOSCOPY (EGD);  Surgeon: Hulen Luster, MD;  Location: [facility] ENDOSCOPY;  Service: Gastroenterology;  Laterality: N/A;   LITHOTRIPSY     SINUS SURGERY WITH INSTATRAK  2008   STAPEDES SURGERY Left age 69   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   Subjective Assessment - 12/18/21 0815     Subjective Pt reports frequent communication  breakdowns with spouse    Patient is accompained by: Family member   spouse   Currently in Pain? No/denies                   ADULT SLP TREATMENT - 12/18/21 0815       General Information   Behavior/Cognition Alert;Cooperative;Pleasant mood    HPI Casey Adkins is an 88 y.o. male referred for MBS by his PCP Dr. Emily Filbert. Pt admitted 10/10/21- 10/11/21 for CVA. 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 Unable to obtain MRI due to ear implant. Hx of esophageal dysphagia requiring dilation in the past; reports 1 year hx of worsening dysphagia prior to CVA. Neurology noted right upper motor neuron pattern weakness, suspected small vessel lacunar infarction. 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 Wife assists with history given pt's dysarthria, aphasia.      Treatment Provided   Treatment provided Cognitive-Linquistic      Cognitive-Linquistic Treatment   Treatment focused on Dysarthria;Aphasia    Skilled Treatment Pt, spouse report communication breakdowns due to both dysarthria and aphasia. Per wife, pt frustrated when he has to repeat himself. Pt reports wanting to be able to just "talk normal" instead of slowing rate as this is hard  for him. Encouraged pt that with practice, this will become easier. Pt completed dysarthria HEP with usual mod cues for slower rate, pausing, and over-articulation. Progressed to word level tasks with cognitive load, with ongoing moderate cues necessary for use of SLOP strategies. Pt had difficulty with naming items in a category, average 4 items with moderate cues, with occasional paraphasias (eg "tabloid" instead of K and W when naming fast food restaurants). Initiated training in use of semantic feature analysis when anomia/dysnomia occurred. Pt required usual mod cues to ID semantic features of targets  (group/use/actionlocation/association/physical description).      Assessment / Recommendations / Plan   Plan Continue with current plan of care      Progression Toward Goals   Progression toward goals Progressing toward goals              SLP Education - 12/18/21 0828     Education Details compensations for dysarthria, aphasia, what is aphasia?    Person(s) Educated Patient;Spouse    Methods Explanation;Demonstration;Verbal cues    Comprehension Verbalized understanding;Need further instruction              SLP Short Term Goals - 11/21/21 1443       SLP SHORT TERM GOAL #1   Title Pt will use aphasia compensations during 5-8 minutes conversation with occasional min cues.    Time 10    Period --   sessions   Status New      SLP SHORT TERM GOAL #2   Title Pt will demonstrate dysarthria HEP with rare min cues.    Time 10    Period --   sessions   Status New      SLP SHORT TERM GOAL #3   Title Patient will use compensations for dysarthria in sentence responses for >95% intelligibility.    Time 10    Period --   sessions   Status New      SLP SHORT TERM GOAL #4   Title Pt will complete further assessment of cognitive-communication PRN.    Time 10    Period --   sessions   Status New              SLP Long Term Goals - 11/21/21 1445       SLP LONG TERM GOAL #1   Title Pt will use aphasia compensations effectively during 20 minutes mod complex conversation with modified independence.    Time 12    Period Weeks    Status New    Target Date 02/18/22      SLP LONG TERM GOAL #2   Title Pt will use aphasia compensations with modified independence to resolve communication breakdown at least 3 occasions outside of ST, per pt/spouse report.    Time 12    Period Weeks    Status New    Target Date 02/18/22      SLP LONG TERM GOAL #3   Title Patient will use compensations for dysarthria in 20 minute conversation for >95% intelligibility with no more than 2  repetitions necessary.    Time 12    Period Weeks    Status New    Target Date 02/18/22      SLP LONG TERM GOAL #4   Title Patient will demonstrate swallowing precautions independently x3 sessions with no overt s/sx aspiration.    Time 12    Period Weeks    Status New    Target Date 02/18/22  Plan - 12/18/21 0829     Clinical Impression Statement Patient presents with moderate dysarthria, mild aphasia, and mild oropharyngeal dysphagia s/p CVA. Speech/voice is characterized by hoarse vocal quality, reduced breath support, imprecise lingual consonants and bizarre resonatory quality. Continued training in dysarthria, anomia compensations today; pt requires usual moderate cues for this at word/phrase level. Patient/wife report he becomes frustrated when he cannot describe something or forgets a word at home. Pt reports coughing on last sip of drink at home; reviewed swallow strategies and MBS results with him today; no overt signs of aspiration when following precautions. I recommend skilled ST to improve intelligibility, verbal expression and swallowing safety.    Speech Therapy Frequency 2x / week    Duration 12 weeks    Treatment/Interventions Aspiration precaution training;Environmental controls;Language facilitation;Cueing hierarchy;Oral motor exercises;SLP instruction and feedback;Pharyngeal strengthening exercises;Compensatory techniques;Cognitive reorganization;Functional tasks;Compensatory strategies;Diet toleration management by SLP;Internal/external aids;Multimodal communcation approach;Patient/family education    Potential to Achieve Goals Good    Consulted and Agree with Plan of Care Patient;Family member/caregiver             Patient will benefit from skilled therapeutic intervention in order to improve the following deficits and impairments:   Aphasia  Dysarthria and anarthria    Problem List Patient Active Problem List   Diagnosis Date Noted    Overweight (BMI 25.0-29.9) 10/11/2021   Hypertriglyceridemia 10/11/2021   Stroke ([place]) 10/10/2021   Diabetes mellitus without complication ([place]) 03/70/4888   HTN (hypertension) 10/10/2021   Pyuria 12/25/2016   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   Medicare annual wellness visit, initial 07/17/2016   B12 deficiency 07/17/2016   History of CVA (cerebrovascular accident) without residual deficits 05/14/2016   PIN III (prostatic intraepithelial neoplasia III) 04/11/2015   Personal history of other malignant neoplasm of skin 09/16/2014   History of nonmelanoma skin cancer 09/16/2014   History of colonic polyps 08/23/2014   Diverticulosis of large intestine 08/23/2014   Benign hematuria 07/20/2014   Type 2 diabetes mellitus ([place]) 07/04/2014   Malignant neoplasm of bladder ([place]) 07/04/2014   DM (diabetes mellitus) type II controlled with renal manifestation ([place]) 07/04/2014   Benign essential hypertension 07/04/2014   Urge incontinence 12/29/2013   Malignant neoplasm of lateral wall of urinary bladder ([place]) 09/09/2012   Incomplete emptying of bladder 09/09/2012   Enlarged prostate with lower urinary tract symptoms (LUTS) 09/09/2012   Elevated prostate specific antigen (PSA) 09/09/2012   Chronic prostatitis 09/09/2012   Carcinoma in situ of prostate 09/09/2012   Kidney stone 09/09/2012   Benign prostatic hyperplasia with urinary obstruction 09/09/2012   Deneise Lever, [place], [place], [place] 12/18/2021, 8:30 AM  [place] MAIN [facility] SERVICES [address] [place], [place], [address] Phone: [phone]   Fax:  [phone]   Name: Casey Adkins MRN: [id number] Date of Birth: 11-Dec-1933

## 2021-12-20 ENCOUNTER — Ambulatory Visit: Payer: Medicare Other | Admitting: Speech Pathology

## 2021-12-20 ENCOUNTER — Other Ambulatory Visit: Payer: Self-pay

## 2021-12-20 DIAGNOSIS — R4701 Aphasia: Secondary | ICD-10-CM

## 2021-12-20 DIAGNOSIS — R471 Dysarthria and anarthria: Secondary | ICD-10-CM

## 2021-12-20 NOTE — Therapy (Signed)
[place] MAIN [facility] SERVICES [address] [place], [place], [address] Phone: [phone]   Fax:  [phone]  Speech Language Pathology Treatment  Patient Details  Name: Casey Adkins MRN: [id number] Date of Birth: April 25, 1934 Referring Provider (SLP): Rusty Aus, MD   Encounter Date: 12/20/2021   End of Session - 12/20/21 1520     Visit Number 4    Number of Visits 25    Date for SLP Re-Evaluation 02/18/22    SLP Start Time 1406    SLP Stop Time  1500    SLP Time Calculation (min) 54 min    Activity Tolerance Patient tolerated treatment well             Past Medical History:  Diagnosis Date   Allergic rhinitis    Arthritis    Cancer ([place])    Bladder Cancer   Cancer [facility])    Prostate Cancer   Diabetes mellitus without complication ([place])    Diverticulosis    Dysphagia    GERD (gastroesophageal reflux disease)    History of kidney stones    h/o   Hypertension    Stroke [facility]) 2006   possible light stroke-no deficits    Past Surgical History:  Procedure Laterality Date   CYSTOSCOPY W/ RETROGRADES Bilateral 06/07/2019   Procedure: CYSTOSCOPY WITH RETROGRADE PYELOGRAM;  Surgeon: Hollice Espy, MD;  Location: [facility] ORS;  Service: Urology;  Laterality: Bilateral;   CYSTOSCOPY WITH BIOPSY N/A 06/07/2019   Procedure: CYSTOSCOPY WITH Bladder BIOPSY;  Surgeon: Hollice Espy, MD;  Location: [facility] ORS;  Service: Urology;  Laterality: N/A;   ESOPHAGOGASTRODUODENOSCOPY N/A 04/21/2015   Procedure: ESOPHAGOGASTRODUODENOSCOPY (EGD);  Surgeon: Hulen Luster, MD;  Location: [facility] ENDOSCOPY;  Service: Gastroenterology;  Laterality: N/A;   LITHOTRIPSY     SINUS SURGERY WITH INSTATRAK  2008   STAPEDES SURGERY Left age 68   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   Subjective Assessment - 12/20/21 1509     Subjective "When he's rushed he gets more  confused," (spouse re: pt)    Currently in Pain? No/denies                   ADULT SLP TREATMENT - 12/20/21 1510       General Information   Behavior/Cognition Alert;Cooperative;Pleasant mood    HPI Casey Adkins is an 88 y.o. male referred for MBS by his PCP Dr. Emily Filbert. Pt admitted 10/10/21- 10/11/21 for CVA. 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 Unable to obtain MRI due to ear implant. Hx of esophageal dysphagia requiring dilation in the past; reports 1 year hx of worsening dysphagia prior to CVA. Neurology noted right upper motor neuron pattern weakness, suspected small vessel lacunar infarction. 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 Wife assists with history given pt's dysarthria, aphasia.      Treatment Provided   Treatment provided Cognitive-Linquistic      Cognitive-Linquistic Treatment   Treatment focused on Dysarthria;Aphasia    Skilled Treatment Education provided for pt and wife using written keywords to aid comprehension re: aphasia. Affirmed that pt knows what he wants to say but that he has trouble expressing this, vs being "confused." Several comprehension breakdowns noted, with SLP needing to rephrase and simplify language for pt  comprehension. Targeted verbal expression and dysarthria in simple conversation re: personal interests and hobbies.  Pt used slow rate with occasional min-mod cues for intelligibility, named personal hobbies with mod question cues (tinkering in his workshop, working on clocks, bowling, watching baseball/football, Gunsmoke, war documentaries, and playing cards.) Pt reports he used to enjoy reading the news but he has had trouble with this lately. Reading comprehension at sentence level: fill in blank from F:4 90% accuracy. Pt selected article on [employer], read with occasional mod cues and rephrasing by SLP,  answered 3/3 questions accurately. Education on purpose of activities for home, 30 minutes daily, take breaks or slow down if getting frustrated.      Assessment / Recommendations / Plan   Plan Continue with current plan of care      Progression Toward Goals   Progression toward goals Progressing toward goals              SLP Education - 12/20/21 1519     Education Details daily practice, breaks to reduce frustration    Person(s) Educated Patient;Spouse    Methods Explanation    Comprehension Verbalized understanding              SLP Short Term Goals - 11/21/21 1443       SLP SHORT TERM GOAL #1   Title Pt will use aphasia compensations during 5-8 minutes conversation with occasional min cues.    Time 10    Period --   sessions   Status New      SLP SHORT TERM GOAL #2   Title Pt will demonstrate dysarthria HEP with rare min cues.    Time 10    Period --   sessions   Status New      SLP SHORT TERM GOAL #3   Title Patient will use compensations for dysarthria in sentence responses for >95% intelligibility.    Time 10    Period --   sessions   Status New      SLP SHORT TERM GOAL #4   Title Pt will complete further assessment of cognitive-communication PRN.    Time 10    Period --   sessions   Status New              SLP Long Term Goals - 11/21/21 1445       SLP LONG TERM GOAL #1   Title Pt will use aphasia compensations effectively during 20 minutes mod complex conversation with modified independence.    Time 12    Period Weeks    Status New    Target Date 02/18/22      SLP LONG TERM GOAL #2   Title Pt will use aphasia compensations with modified independence to resolve communication breakdown at least 3 occasions outside of ST, per pt/spouse report.    Time 12    Period Weeks    Status New    Target Date 02/18/22      SLP LONG TERM GOAL #3   Title Patient will use compensations for dysarthria in 20 minute conversation for >95% intelligibility  with no more than 2 repetitions necessary.    Time 12    Period Weeks    Status New    Target Date 02/18/22      SLP LONG TERM GOAL #4   Title Patient will demonstrate swallowing precautions independently x3 sessions with no overt s/sx aspiration.    Time 12    Period Weeks  Status New    Target Date 02/18/22              Plan - 12/20/21 1520     Clinical Impression Statement Patient presents with moderate dysarthria, mild aphasia, and mild oropharyngeal dysphagia s/p CVA. Speech/voice is characterized by hoarse vocal quality, reduced breath support, imprecise lingual consonants and bizarre resonatory quality. Continued training in dysarthria, anomia compensations today; pt able to carry over slow rate for short conversational responses with occasional mod cues. Patient/wife report he has more difficulty with words when under time pressure or when he is frustrated. Hearing loss affects auditory comprehension but appears to have mild language comprehension deficits in conversation as well. I recommend skilled ST to improve intelligibility, verbal expression and swallowing safety.    Speech Therapy Frequency 2x / week    Duration 12 weeks    Treatment/Interventions Aspiration precaution training;Environmental controls;Language facilitation;Cueing hierarchy;Oral motor exercises;SLP instruction and feedback;Pharyngeal strengthening exercises;Compensatory techniques;Cognitive reorganization;Functional tasks;Compensatory strategies;Diet toleration management by SLP;Internal/external aids;Multimodal communcation approach;Patient/family education    Potential to Achieve Goals Good    Consulted and Agree with Plan of Care Patient;Family member/caregiver             Patient will benefit from skilled therapeutic intervention in order to improve the following deficits and impairments:   Aphasia  Dysarthria and anarthria    Problem List Patient Active Problem List   Diagnosis Date  Noted   Overweight (BMI 25.0-29.9) 10/11/2021   Hypertriglyceridemia 10/11/2021   Stroke ([place]) 10/10/2021   Diabetes mellitus without complication ([place]) 41/66/0630   HTN (hypertension) 10/10/2021   Pyuria 12/25/2016   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   Medicare annual wellness visit, initial 07/17/2016   B12 deficiency 07/17/2016   History of CVA (cerebrovascular accident) without residual deficits 05/14/2016   PIN III (prostatic intraepithelial neoplasia III) 04/11/2015   Personal history of other malignant neoplasm of skin 09/16/2014   History of nonmelanoma skin cancer 09/16/2014   History of colonic polyps 08/23/2014   Diverticulosis of large intestine 08/23/2014   Benign hematuria 07/20/2014   Type 2 diabetes mellitus ([place]) 07/04/2014   Malignant neoplasm of bladder ([place]) 07/04/2014   DM (diabetes mellitus) type II controlled with renal manifestation ([place]) 07/04/2014   Benign essential hypertension 07/04/2014   Urge incontinence 12/29/2013   Malignant neoplasm of lateral wall of urinary bladder ([place]) 09/09/2012   Incomplete emptying of bladder 09/09/2012   Enlarged prostate with lower urinary tract symptoms (LUTS) 09/09/2012   Elevated prostate specific antigen (PSA) 09/09/2012   Chronic prostatitis 09/09/2012   Carcinoma in situ of prostate 09/09/2012   Kidney stone 09/09/2012   Benign prostatic hyperplasia with urinary obstruction 09/09/2012   Deneise Lever, [place], CCC-SLP Speech-Language Pathologist  Aliene Altes, [place] 12/20/2021, 3:22 PM  [place] MAIN [facility] SERVICES [address] [place], [place], [address] Phone: [phone]   Fax:  [phone]   Name: Casey Adkins MRN: [id number] Date of Birth: Oct 25, 1934

## 2021-12-24 ENCOUNTER — Other Ambulatory Visit: Payer: Self-pay

## 2021-12-24 ENCOUNTER — Ambulatory Visit: Payer: Medicare Other | Admitting: Speech Pathology

## 2021-12-24 DIAGNOSIS — R471 Dysarthria and anarthria: Secondary | ICD-10-CM

## 2021-12-24 DIAGNOSIS — R4701 Aphasia: Secondary | ICD-10-CM | POA: Diagnosis not present

## 2021-12-25 NOTE — Therapy (Signed)
[place] MAIN [facility] SERVICES [address] [place], [place], [address] Phone: [phone]   Fax:  [phone]  Speech Language Pathology Treatment  Patient Details  Name: Casey Adkins MRN: [id number] Date of Birth: 07/07/1934 Referring Provider (SLP): Rusty Aus, MD   Encounter Date: 12/24/2021   End of Session - 12/25/21 0903     Visit Number 5    Number of Visits 25    Date for SLP Re-Evaluation 02/18/22    SLP Start Time 14    SLP Stop Time  1700    SLP Time Calculation (min) 60 min    Activity Tolerance Patient tolerated treatment well             Past Medical History:  Diagnosis Date   Allergic rhinitis    Arthritis    Cancer ([place])    Bladder Cancer   Cancer [facility])    Prostate Cancer   Diabetes mellitus without complication ([employer])    Diverticulosis    Dysphagia    GERD (gastroesophageal reflux disease)    History of kidney stones    h/o   Hypertension    Stroke [facility]) 2006   possible light stroke-no deficits    Past Surgical History:  Procedure Laterality Date   CYSTOSCOPY W/ RETROGRADES Bilateral 06/07/2019   Procedure: CYSTOSCOPY WITH RETROGRADE PYELOGRAM;  Surgeon: Hollice Espy, MD;  Location: [facility] ORS;  Service: Urology;  Laterality: Bilateral;   CYSTOSCOPY WITH BIOPSY N/A 06/07/2019   Procedure: CYSTOSCOPY WITH Bladder BIOPSY;  Surgeon: Hollice Espy, MD;  Location: [facility] ORS;  Service: Urology;  Laterality: N/A;   ESOPHAGOGASTRODUODENOSCOPY N/A 04/21/2015   Procedure: ESOPHAGOGASTRODUODENOSCOPY (EGD);  Surgeon: Hulen Luster, MD;  Location: 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   Subjective Assessment - 12/25/21 0838     Subjective Pt, spouse report many  communication breakdowns    Patient is accompained by: Family member   spouse   Currently in Pain? No/denies                   ADULT SLP TREATMENT - 12/25/21 0838       General Information   Behavior/Cognition Alert;Cooperative;Pleasant mood    HPI Casey Adkins is an 88 y.o. male referred for MBS by his PCP Dr. Emily Filbert. Pt admitted 10/10/21- 10/11/21 for CVA. 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 Unable to obtain MRI due to ear implant. Hx of esophageal dysphagia requiring dilation in the past; reports 1 year hx of worsening dysphagia prior to CVA. Neurology noted right upper motor neuron pattern weakness, suspected small vessel lacunar infarction. 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 Wife assists with history given pt's dysarthria, aphasia.      Treatment Provided   Treatment provided Cognitive-Linquistic      Cognitive-Linquistic Treatment   Treatment focused on Dysarthria;Aphasia    Skilled Treatment Spouse reports comprehension difficulties, pt missing details in conversation. Pt has hearing aids but has not had re-evaluation with audiologist since CVA, encouraged them to follow up with this. Comprehension of 2-step, 2 component instructions 70%. Improves with written cues  for fricative/high-frequency sounds (hearing loss), however pt also required cues for language processing (slower rate, use repeats). Naming items 90% accuracy with 1 paraphasia noted (woodpecker/wheelbarrow), which pt corrected with min cues. Pt reports difficulty reading, questions visual changes. Reports having vision checked post-CVA. Able to complete complex visual scanning/symbol matching tasks 100% accuracy. With paragraph level reading, several paraphasias noted with pt intermittently aware (eg:  and/but, our/one, sovereign/surrounding, indescribable/inscrutible).       Assessment / Recommendations / Plan   Plan Continue with current plan of care      Progression Toward Goals   Progression toward goals Progressing toward goals              SLP Education - 12/25/21 0903     Education Details recommend follow-up with audiologist to assess for hearing changes post-CVA    Person(s) Educated Patient;Spouse    Methods Explanation    Comprehension Verbalized understanding              SLP Short Term Goals - 11/21/21 1443       SLP SHORT TERM GOAL #1   Title Pt will use aphasia compensations during 5-8 minutes conversation with occasional min cues.    Time 10    Period --   sessions   Status New      SLP SHORT TERM GOAL #2   Title Pt will demonstrate dysarthria HEP with rare min cues.    Time 10    Period --   sessions   Status New      SLP SHORT TERM GOAL #3   Title Patient will use compensations for dysarthria in sentence responses for >95% intelligibility.    Time 10    Period --   sessions   Status New      SLP SHORT TERM GOAL #4   Title Pt will complete further assessment of cognitive-communication PRN.    Time 10    Period --   sessions   Status New              SLP Long Term Goals - 11/21/21 1445       SLP LONG TERM GOAL #1   Title Pt will use aphasia compensations effectively during 20 minutes mod complex conversation with modified independence.    Time 12    Period Weeks    Status New    Target Date 02/18/22      SLP LONG TERM GOAL #2   Title Pt will use aphasia compensations with modified independence to resolve communication breakdown at least 3 occasions outside of ST, per pt/spouse report.    Time 12    Period Weeks    Status New    Target Date 02/18/22      SLP LONG TERM GOAL #3   Title Patient will use compensations for dysarthria in 20 minute conversation for >95% intelligibility with no more than 2 repetitions necessary.    Time 12    Period Weeks    Status New    Target Date 02/18/22       SLP LONG TERM GOAL #4   Title Patient will demonstrate swallowing precautions independently x3 sessions with no overt s/sx aspiration.    Time 12    Period Weeks    Status New    Target Date 02/18/22              Plan - 12/25/21 2595     Clinical Impression Statement Patient presents with moderate dysarthria, mild aphasia,  and mild oropharyngeal dysphagia s/p CVA. Speech/voice is characterized by hoarse vocal quality, reduced breath support, imprecise lingual consonants and bizarre resonatory quality. Pt utilizing slower rate in conversation today for improved intelligibility and wordfinding; occasional cues remain necessary for consistency. Hearing loss affects auditory comprehension but appears to have mild language comprehension deficits in conversation as well. Recommended pt visit his audiologist for reevaluation. I recommend skilled ST to improve intelligibility, verbal expression and swallowing safety.    Speech Therapy Frequency 2x / week    Duration 12 weeks    Treatment/Interventions Aspiration precaution training;Environmental controls;Language facilitation;Cueing hierarchy;Oral motor exercises;SLP instruction and feedback;Pharyngeal strengthening exercises;Compensatory techniques;Cognitive reorganization;Functional tasks;Compensatory strategies;Diet toleration management by SLP;Internal/external aids;Multimodal communcation approach;Patient/family education    Potential to Achieve Goals Good    Consulted and Agree with Plan of Care Patient;Family member/caregiver             Patient will benefit from skilled therapeutic intervention in order to improve the following deficits and impairments:   Aphasia  Dysarthria and anarthria    Problem List Patient Active Problem List   Diagnosis Date Noted   Overweight (BMI 25.0-29.9) 10/11/2021   Hypertriglyceridemia 10/11/2021   Stroke ([place]) 10/10/2021   Diabetes mellitus without complication ([place]) 49/82/6415   HTN  (hypertension) 10/10/2021   Pyuria 12/25/2016   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   Medicare annual wellness visit, initial 07/17/2016   B12 deficiency 07/17/2016   History of CVA (cerebrovascular accident) without residual deficits 05/14/2016   PIN III (prostatic intraepithelial neoplasia III) 04/11/2015   Personal history of other malignant neoplasm of skin 09/16/2014   History of nonmelanoma skin cancer 09/16/2014   History of colonic polyps 08/23/2014   Diverticulosis of large intestine 08/23/2014   Benign hematuria 07/20/2014   Type 2 diabetes mellitus ([place]) 07/04/2014   Malignant neoplasm of bladder ([place]) 07/04/2014   DM (diabetes mellitus) type II controlled with renal manifestation ([place]) 07/04/2014   Benign essential hypertension 07/04/2014   Urge incontinence 12/29/2013   Malignant neoplasm of lateral wall of urinary bladder ([place]) 09/09/2012   Incomplete emptying of bladder 09/09/2012   Enlarged prostate with lower urinary tract symptoms (LUTS) 09/09/2012   Elevated prostate specific antigen (PSA) 09/09/2012   Chronic prostatitis 09/09/2012   Carcinoma in situ of prostate 09/09/2012   Kidney stone 09/09/2012   Benign prostatic hyperplasia with urinary obstruction 09/09/2012   Deneise Lever, [place], [place], [place] 12/25/2021, 9:07 AM  [place] MAIN [facility] SERVICES [address] [place], [place], [address] Phone: [phone]   Fax:  [phone]   Name: Casey Adkins MRN: [id number] Date of Birth: 10-18-1934

## 2021-12-26 ENCOUNTER — Other Ambulatory Visit: Payer: Self-pay

## 2021-12-26 ENCOUNTER — Ambulatory Visit: Payer: Medicare Other | Attending: Internal Medicine | Admitting: Speech Pathology

## 2021-12-26 DIAGNOSIS — R1312 Dysphagia, oropharyngeal phase: Secondary | ICD-10-CM | POA: Insufficient documentation

## 2021-12-26 DIAGNOSIS — R41841 Cognitive communication deficit: Secondary | ICD-10-CM | POA: Diagnosis present

## 2021-12-26 DIAGNOSIS — R4701 Aphasia: Secondary | ICD-10-CM | POA: Diagnosis not present

## 2021-12-26 DIAGNOSIS — R471 Dysarthria and anarthria: Secondary | ICD-10-CM | POA: Diagnosis present

## 2021-12-27 NOTE — Therapy (Signed)
[place] MAIN [facility] SERVICES [address] [place], [place], [address] Phone: [phone]   Fax:  [phone]  Speech Language Pathology Treatment  Patient Details  Name: Casey Adkins MRN: [id number] Date of Birth: [birth year] Referring Provider (SLP): Rusty Aus, MD   Encounter Date: 12/26/2021   End of Session - 12/27/21 0813     Visit Number 6    Number of Visits 25    Date for SLP Re-Evaluation 02/18/22    SLP Start Time 1502    SLP Stop Time  1600    SLP Time Calculation (min) 58 min    Activity Tolerance Patient tolerated treatment well             Past Medical History:  Diagnosis Date   Allergic rhinitis    Arthritis    Cancer ([place])    Bladder Cancer   Cancer [facility])    Prostate Cancer   Diabetes mellitus without complication ([place])    Diverticulosis    Dysphagia    GERD (gastroesophageal reflux disease)    History of kidney stones    h/o   Hypertension    Stroke [facility]) 2006   possible light stroke-no deficits    Past Surgical History:  Procedure Laterality Date   CYSTOSCOPY W/ RETROGRADES Bilateral 06/07/2019   Procedure: CYSTOSCOPY WITH RETROGRADE PYELOGRAM;  Surgeon: Hollice Espy, MD;  Location: [facility] ORS;  Service: Urology;  Laterality: Bilateral;   CYSTOSCOPY WITH BIOPSY N/A 06/07/2019   Procedure: CYSTOSCOPY WITH Bladder BIOPSY;  Surgeon: Hollice Espy, MD;  Location: [facility] ORS;  Service: Urology;  Laterality: N/A;   ESOPHAGOGASTRODUODENOSCOPY N/A 04/21/2015   Procedure: ESOPHAGOGASTRODUODENOSCOPY (EGD);  Surgeon: Hulen Luster, MD;  Location: [facility] ENDOSCOPY;  Service: Gastroenterology;  Laterality: N/A;   LITHOTRIPSY     SINUS SURGERY WITH INSTATRAK  2008   STAPEDES SURGERY Left age 43   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   Subjective Assessment - 12/26/21 1613     Subjective "I just want to talk regular"     Patient is accompained by: Family member   Spouse   Currently in Pain? No/denies                   ADULT SLP TREATMENT - 12/26/21 1615       General Information   Behavior/Cognition Alert;Cooperative;Pleasant mood    HPI Casey Adkins is an 88 y.o. male referred for MBS by his PCP Dr. Emily Filbert. Pt admitted 10/10/21- 10/11/21 for CVA. 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 Unable to obtain MRI due to ear implant. Hx of esophageal dysphagia requiring dilation in the past; reports 1 year hx of worsening dysphagia prior to CVA. Neurology noted right upper motor neuron pattern weakness, suspected small vessel lacunar infarction. 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 Wife assists with history given pt's dysarthria, aphasia.      Treatment Provided   Treatment provided Cognitive-Linquistic      Cognitive-Linquistic Treatment   Treatment focused on Dysarthria;Aphasia    Skilled Treatment Pt reports comprehension difficulties. Facilitated homework review of article and comprehension questions, 100% accuracy (3/3). Targeted dysarthria during reading tasks pt required mod cues initially to slow down, faded to min cues, 80% accuracy. Pt required min-mod cues to pause  and swallow saliva. With paragraph level reading, several paraphasias noted with pt intermittently aware (eg:  artichoke/artificial, trenches/ditches, [place], 20/21, parachute/participated.) Pt reports getting numbers mixed up and no longer able to dial telephone numbers. Naming items task, named 10/10 items correctly. Facilitated use of semantic feature analysis to aid in identifying 1 item ([place]). Pt and spouse educated in use of semantic feature analysis to help with word-finding difficulties. Focused on dysarthria with functional reading task; patient used SLOP strategies with modified  independence. Pt answered 3/3 comprehension questions correctly.     Assessment / Recommendations / Plan   Plan Continue with current plan of care      Progression Toward Goals   Progression toward goals Progressing toward goals              SLP Education - 12/27/21 0811     Education Details word finding strategies    Person(s) Educated Patient;Spouse    Methods Explanation;Demonstration;Handout    Comprehension Verbalized understanding;Need further instruction              SLP Short Term Goals - 11/21/21 1443       SLP SHORT TERM GOAL #1   Title Pt will use aphasia compensations during 5-8 minutes conversation with occasional min cues.    Time 10    Period --   sessions   Status New      SLP SHORT TERM GOAL #2   Title Pt will demonstrate dysarthria HEP with rare min cues.    Time 10    Period --   sessions   Status New      SLP SHORT TERM GOAL #3   Title Patient will use compensations for dysarthria in sentence responses for >95% intelligibility.    Time 10    Period --   sessions   Status New      SLP SHORT TERM GOAL #4   Title Pt will complete further assessment of cognitive-communication PRN.    Time 10    Period --   sessions   Status New              SLP Long Term Goals - 11/21/21 1445       SLP LONG TERM GOAL #1   Title Pt will use aphasia compensations effectively during 20 minutes mod complex conversation with modified independence.    Time 12    Period Weeks    Status New    Target Date 02/18/22      SLP LONG TERM GOAL #2   Title Pt will use aphasia compensations with modified independence to resolve communication breakdown at least 3 occasions outside of ST, per pt/spouse report.    Time 12    Period Weeks    Status New    Target Date 02/18/22      SLP LONG TERM GOAL #3   Title Patient will use compensations for dysarthria in 20 minute conversation for >95% intelligibility with no more than 2 repetitions necessary.    Time 12     Period Weeks    Status New    Target Date 02/18/22      SLP LONG TERM GOAL #4   Title Patient will demonstrate swallowing precautions independently x3 sessions with no overt s/sx aspiration.    Time 12    Period Weeks    Status New    Target Date 02/18/22              Plan - 12/27/21 [phone]  Clinical Impression Statement Patient presents with moderate dysarthria, mild aphasia, and mild oropharyngeal dysphagia s/p CVA. Speech/voice is characterized by hoarse vocal quality, reduced breath support, imprecise lingual consonants and bizarre resonatory quality. Pt initially required mod verbal cues to slow down during reading tasks, faded to min cues. Pt and spouse educated in semantic feature analysis to improve word-finding. Pt starting to iniate use of semantic features when anomia occurs (ie "it's his most famous song"). Pt reports difficulty reading and dialing numbers. Hearing loss affects auditory comprehension but appears to have mild language comprehension deficits in conversation as well. Recommended pt visit his audiologist for reevaluation. Given errors in reading comprehension, will consider further assessment of this and update goals as appropriate. I recommend skilled ST to improve intelligibility, verbal expression and swallowing safety.    Speech Therapy Frequency 2x / week    Duration 12 weeks    Treatment/Interventions Aspiration precaution training;Environmental controls;Language facilitation;Cueing hierarchy;Oral motor exercises;SLP instruction and feedback;Pharyngeal strengthening exercises;Compensatory techniques;Cognitive reorganization;Functional tasks;Compensatory strategies;Diet toleration management by SLP;Internal/external aids;Multimodal communcation approach;Patient/family education    Potential to Achieve Goals Good    Consulted and Agree with Plan of Care Patient;Family member/caregiver             Patient will benefit from skilled therapeutic  intervention in order to improve the following deficits and impairments:   Aphasia  Dysarthria and anarthria    Problem List Patient Active Problem List   Diagnosis Date Noted   Overweight (BMI 25.0-29.9) 10/11/2021   Hypertriglyceridemia 10/11/2021   Stroke ([place]) 10/10/2021   Diabetes mellitus without complication ([place]) 85/46/2703   HTN (hypertension) 10/10/2021   Pyuria 12/25/2016   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   Medicare annual wellness visit, initial 07/17/2016   B12 deficiency 07/17/2016   History of CVA (cerebrovascular accident) without residual deficits 05/14/2016   PIN III (prostatic intraepithelial neoplasia III) 04/11/2015   Personal history of other malignant neoplasm of skin 09/16/2014   History of nonmelanoma skin cancer 09/16/2014   History of colonic polyps 08/23/2014   Diverticulosis of large intestine 08/23/2014   Benign hematuria 07/20/2014   Type 2 diabetes mellitus ([place]) 07/04/2014   Malignant neoplasm of bladder ([place]) 07/04/2014   DM (diabetes mellitus) type II controlled with renal manifestation ([place]) 07/04/2014   Benign essential hypertension 07/04/2014   Urge incontinence 12/29/2013   Malignant neoplasm of lateral wall of urinary bladder ([place]) 09/09/2012   Incomplete emptying of bladder 09/09/2012   Enlarged prostate with lower urinary tract symptoms (LUTS) 09/09/2012   Elevated prostate specific antigen (PSA) 09/09/2012   Chronic prostatitis 09/09/2012   Carcinoma in situ of prostate 09/09/2012   Kidney stone 09/09/2012   Benign prostatic hyperplasia with urinary obstruction 09/09/2012   [facility] [place] [facility], [place] 12/27/2021, 8:52 AM  [place] MAIN [facility] SERVICES [address] [place], [place], [address] Phone: [phone]   Fax:  [phone]   Name: Casey Adkins MRN: [id number] Date of Birth: 03-29-1934

## 2021-12-31 ENCOUNTER — Ambulatory Visit: Payer: Medicare Other | Admitting: Speech Pathology

## 2021-12-31 ENCOUNTER — Other Ambulatory Visit: Payer: Self-pay

## 2021-12-31 DIAGNOSIS — R1312 Dysphagia, oropharyngeal phase: Secondary | ICD-10-CM

## 2021-12-31 DIAGNOSIS — R41841 Cognitive communication deficit: Secondary | ICD-10-CM

## 2021-12-31 DIAGNOSIS — R4701 Aphasia: Secondary | ICD-10-CM

## 2021-12-31 DIAGNOSIS — R471 Dysarthria and anarthria: Secondary | ICD-10-CM

## 2021-12-31 NOTE — Therapy (Signed)
[place] MAIN [facility] SERVICES [address] [place], [place], [address] Phone: [phone]   Fax:  [phone]  Speech Language Pathology Treatment  Patient Details  Name: Casey Adkins MRN: [id number] Date of Birth: Feb 09, 1934 Referring Provider (SLP): Rusty Aus, MD   Encounter Date: 12/31/2021   End of Session - 12/31/21 1421     Visit Number 7    Number of Visits 25    Date for SLP Re-Evaluation 02/18/22    SLP Start Time 1302    SLP Stop Time  1400    SLP Time Calculation (min) 58 min    Activity Tolerance Patient tolerated treatment well             Past Medical History:  Diagnosis Date   Allergic rhinitis    Arthritis    Cancer ([place])    Bladder Cancer   Cancer [facility])    Prostate Cancer   Diabetes mellitus without complication ([place])    Diverticulosis    Dysphagia    GERD (gastroesophageal reflux disease)    History of kidney stones    h/o   Hypertension    Stroke [facility]) 2006   possible light stroke-no deficits    Past Surgical History:  Procedure Laterality Date   CYSTOSCOPY W/ RETROGRADES Bilateral 06/07/2019   Procedure: CYSTOSCOPY WITH RETROGRADE PYELOGRAM;  Surgeon: Hollice Espy, MD;  Location: [facility] ORS;  Service: Urology;  Laterality: Bilateral;   CYSTOSCOPY WITH BIOPSY N/A 06/07/2019   Procedure: CYSTOSCOPY WITH Bladder BIOPSY;  Surgeon: Hollice Espy, MD;  Location: [facility] ORS;  Service: Urology;  Laterality: N/A;   ESOPHAGOGASTRODUODENOSCOPY N/A 04/21/2015   Procedure: ESOPHAGOGASTRODUODENOSCOPY (EGD);  Surgeon: Hulen Luster, MD;  Location: [facility] ENDOSCOPY;  Service: Gastroenterology;  Laterality: N/A;   LITHOTRIPSY     SINUS SURGERY WITH INSTATRAK  2008   STAPEDES SURGERY Left age 73   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   Subjective Assessment - 12/31/21 1403     Subjective " I can't always talk slower"     Patient is accompained by: Family member   Spouse present for session   Currently in Pain? No/denies    Pain Score 0-No pain                   ADULT SLP TREATMENT - 12/31/21 1404       General Information   Behavior/Cognition Alert;Cooperative;Pleasant mood    HPI Casey Adkins is an 88 y.o. male referred for MBS by his PCP Dr. Emily Filbert. Pt admitted 10/10/21- 10/11/21 for CVA. 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 Unable to obtain MRI due to ear implant. Hx of esophageal dysphagia requiring dilation in the past; reports 1 year hx of worsening dysphagia prior to CVA. Neurology noted right upper motor neuron pattern weakness, suspected small vessel lacunar infarction. 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 Wife assists with history given pt's dysarthria, aphasia.      Treatment Provided   Treatment provided Cognitive-Linquistic      Cognitive-Linquistic Treatment   Treatment focused on Dysarthria;Aphasia    Skilled Treatment Pt reports comprehension difficulties. Spouse reports increased word-finding difficulties over the weekend. During reading tasks pt demonstrated intelligible speech with total of 16 dysarthric errors across 10 minute paragraph level  reading. Pt required mod verbal  cues to improve intelligibility (repeat, slow down). Occasional paraphasias during reading tasks and conversation. Word level reading, demonstrated intelligibilty with 60% accuracy out of 20 opportunities, improved to 100% with min cues for strategy usage. Pt demonstrated carryover of strategy use with sentence level reading at 90% acc out of 20 opportunities. Pt and spouse educated in use of semantic feature analysis to help with word-finding difficulties. Word level naming task ("complete the list"), 87% accuracy out of 15 opportunities with min verbal cues.      Assessment  / Recommendations / Plan   Plan Continue with current plan of care      Progression Toward Goals   Progression toward goals Progressing toward goals              SLP Education - 12/31/21 1419     Education Details slow rate to increase intelligibilty, anomia and paraphasic errors related to stroke and SLP POC to address with SFA    Person(s) Educated Patient;Spouse    Methods Explanation;Verbal cues    Comprehension Verbalized understanding;Need further instruction              SLP Short Term Goals - 11/21/21 1443       SLP SHORT TERM GOAL #1   Title Pt will use aphasia compensations during 5-8 minutes conversation with occasional min cues.    Time 10    Period --   sessions   Status New      SLP SHORT TERM GOAL #2   Title Pt will demonstrate dysarthria HEP with rare min cues.    Time 10    Period --   sessions   Status New      SLP SHORT TERM GOAL #3   Title Patient will use compensations for dysarthria in sentence responses for >95% intelligibility.    Time 10    Period --   sessions   Status New      SLP SHORT TERM GOAL #4   Title Pt will complete further assessment of cognitive-communication PRN.    Time 10    Period --   sessions   Status New              SLP Long Term Goals - 11/21/21 1445       SLP LONG TERM GOAL #1   Title Pt will use aphasia compensations effectively during 20 minutes mod complex conversation with modified independence.    Time 12    Period Weeks    Status New    Target Date 02/18/22      SLP LONG TERM GOAL #2   Title Pt will use aphasia compensations with modified independence to resolve communication breakdown at least 3 occasions outside of ST, per pt/spouse report.    Time 12    Period Weeks    Status New    Target Date 02/18/22      SLP LONG TERM GOAL #3   Title Patient will use compensations for dysarthria in 20 minute conversation for >95% intelligibility with no more than 2 repetitions necessary.    Time 12     Period Weeks    Status New    Target Date 02/18/22      SLP LONG TERM GOAL #4   Title Patient will demonstrate swallowing precautions independently x3 sessions with no overt s/sx aspiration.    Time 12    Period Weeks    Status New    Target Date 02/18/22  Plan - 12/31/21 1421     Clinical Impression Statement Patient presents with moderate dysarthria, mild aphasia, and mild oropharyngeal dysphagia s/p CVA. Speech/voice is characterized by hoarse vocal quality, reduced breath support, imprecise lingual consonants and atypical resonatory quality. Skilled SLP intervention targeted dysarthria with improved intelligibility via word,sentence, and paragraph level reading. Improved carryover of strategies across session, though pt continues to benefit from min-mod verbal cues to identify and improve dysarthric speech. Targeted anomia and word finding via structured categorical tasks. Pt demonstrated excellent word-finding as per data above, though continues to require cues and assistance with conversational level word finding. I recommend skilled ST to improve intelligibility, verbal expression and swallowing safety necessary for increased communicatory success and independence in the home and community environments.    Speech Therapy Frequency 2x / week    Duration 12 weeks    Treatment/Interventions Aspiration precaution training;Environmental controls;Language facilitation;Cueing hierarchy;Oral motor exercises;SLP instruction and feedback;Pharyngeal strengthening exercises;Compensatory techniques;Cognitive reorganization;Functional tasks;Compensatory strategies;Diet toleration management by SLP;Internal/external aids;Multimodal communcation approach;Patient/family education    Potential to Achieve Goals Good    Consulted and Agree with Plan of Care Patient;Family member/caregiver             Patient will benefit from skilled therapeutic intervention in order to improve the  following deficits and impairments:   Dysarthria and anarthria  Aphasia  Dysphagia, oropharyngeal phase  Cognitive communication deficit    Problem List Patient Active Problem List   Diagnosis Date Noted   Overweight (BMI 25.0-29.9) 10/11/2021   Hypertriglyceridemia 10/11/2021   Stroke ([place]) 10/10/2021   Diabetes mellitus without complication ([place]) 82/64/1583   HTN (hypertension) 10/10/2021   Pyuria 12/25/2016   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   Medicare annual wellness visit, initial 07/17/2016   B12 deficiency 07/17/2016   History of CVA (cerebrovascular accident) without residual deficits 05/14/2016   PIN III (prostatic intraepithelial neoplasia III) 04/11/2015   Personal history of other malignant neoplasm of skin 09/16/2014   History of nonmelanoma skin cancer 09/16/2014   History of colonic polyps 08/23/2014   Diverticulosis of large intestine 08/23/2014   Benign hematuria 07/20/2014   Type 2 diabetes mellitus ([place]) 07/04/2014   Malignant neoplasm of bladder ([place]) 07/04/2014   DM (diabetes mellitus) type II controlled with renal manifestation ([place]) 07/04/2014   Benign essential hypertension 07/04/2014   Urge incontinence 12/29/2013   Malignant neoplasm of lateral wall of urinary bladder ([place]) 09/09/2012   Incomplete emptying of bladder 09/09/2012   Enlarged prostate with lower urinary tract symptoms (LUTS) 09/09/2012   Elevated prostate specific antigen (PSA) 09/09/2012   Chronic prostatitis 09/09/2012   Carcinoma in situ of prostate 09/09/2012   Kidney stone 09/09/2012   Benign prostatic hyperplasia with urinary obstruction 09/09/2012  [place] L. Arelie Kuzel, M.A. [place] [phone]   [place], [place] 12/31/2021, 2:29 PM  [place] MAIN [facility] SERVICES [address] [place], [place], [address] Phone: [phone]    Fax:  [phone]   Name: Casey Adkins MRN: [id number] Date of Birth: 03/19/1934

## 2021-12-31 NOTE — Patient Instructions (Signed)
Homework provided for continued word finding practice and improve dysarthria

## 2022-01-02 ENCOUNTER — Ambulatory Visit: Payer: Medicare Other | Admitting: Speech Pathology

## 2022-01-02 ENCOUNTER — Other Ambulatory Visit: Payer: Self-pay

## 2022-01-02 DIAGNOSIS — R4701 Aphasia: Secondary | ICD-10-CM | POA: Diagnosis not present

## 2022-01-02 DIAGNOSIS — R471 Dysarthria and anarthria: Secondary | ICD-10-CM

## 2022-01-02 NOTE — Therapy (Signed)
[place] MAIN [facility] SERVICES [address] [place], [place], [address] Phone: [phone]   Fax:  [phone]  Speech Language Pathology Treatment  Patient Details  Name: Casey Adkins MRN: [id number] Date of Birth: [birth year] Referring Provider (SLP): Rusty Aus, MD   Encounter Date: 01/02/2022   End of Session - 01/02/22 1530     Visit Number 8    Number of Visits 25    Date for SLP Re-Evaluation 02/18/22    SLP Start Time 21    SLP Stop Time  1400    SLP Time Calculation (min) 60 min    Activity Tolerance Patient tolerated treatment well             Past Medical History:  Diagnosis Date   Allergic rhinitis    Arthritis    Cancer ([place])    Bladder Cancer   Cancer [facility])    Prostate Cancer   Diabetes mellitus without complication ([place])    Diverticulosis    Dysphagia    GERD (gastroesophageal reflux disease)    History of kidney stones    h/o   Hypertension    Stroke [facility]) 2006   possible light stroke-no deficits    Past Surgical History:  Procedure Laterality Date   CYSTOSCOPY W/ RETROGRADES Bilateral 06/07/2019   Procedure: CYSTOSCOPY WITH RETROGRADE PYELOGRAM;  Surgeon: Hollice Espy, MD;  Location: [facility] ORS;  Service: Urology;  Laterality: Bilateral;   CYSTOSCOPY WITH BIOPSY N/A 06/07/2019   Procedure: CYSTOSCOPY WITH Bladder BIOPSY;  Surgeon: Hollice Espy, MD;  Location: [facility] ORS;  Service: Urology;  Laterality: N/A;   ESOPHAGOGASTRODUODENOSCOPY N/A 04/21/2015   Procedure: ESOPHAGOGASTRODUODENOSCOPY (EGD);  Surgeon: Hulen Luster, MD;  Location: [facility] ENDOSCOPY;  Service: Gastroenterology;  Laterality: N/A;   LITHOTRIPSY     SINUS SURGERY WITH INSTATRAK  2008   STAPEDES SURGERY Left age 76   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   Subjective Assessment - 01/02/22 1510     Subjective Pt was pleasant and engaged during  session    Patient is accompained by: Family member   Spouse present in session   Currently in Pain? No/denies    Pain Score 0-No pain                   ADULT SLP TREATMENT - 01/02/22 1511       General Information   Behavior/Cognition Alert;Cooperative;Pleasant mood    HPI Casey Adkins is an 88 y.o. male referred for MBS by his PCP Dr. Emily Filbert. Pt admitted 10/10/21- 10/11/21 for CVA. 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 Unable to obtain MRI due to ear implant. Hx of esophageal dysphagia requiring dilation in the past; reports 1 year hx of worsening dysphagia prior to CVA. Neurology noted right upper motor neuron pattern weakness, suspected small vessel lacunar infarction. 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 Wife assists with history given pt's dysarthria, aphasia.      Treatment Provided   Treatment provided Cognitive-Linquistic      Cognitive-Linquistic Treatment   Treatment focused on Dysarthria;Aphasia    Skilled Treatment Spouse reports increased intelligibility at home yesterday. Targeted intelligibility during conversational task. Pt responses were relevant and appropiate. Pt provided intelligible responses with 70% accuracy out of 20 opportunities.  Pt benefited from min assistance to improve errors with cue to repeat though patient able to independently utilize strategies (increased loudness vs slowed rate vs over articulation). Pt had 4 occurences of anomia during conversational task upon which patient attempted to skip expression of thought, patient required encouragement to attempt word finding and utilize strategies. Targeted naming/word finding during word categorical task requiring generation of 5 association words within a topic/category, patient provided all 5 association words with 70% accuracy x4 topic sets though patient improved to  100% accuracy with min verbal context cues and written phonemic cues. Trained semantic feature analysis with modified taboo game, patient required max assistance for feature generation faded to min with continued practice across x10 words.      Assessment / Recommendations / Plan   Plan Continue with current plan of care      Progression Toward Goals   Progression toward goals Progressing toward goals              SLP Education - 01/02/22 1528     Education Details Semantic Feature Analysis    Person(s) Educated Patient;Spouse    Methods Explanation;Demonstration    Comprehension Verbalized understanding;Returned demonstration              SLP Short Term Goals - 11/21/21 1443       SLP SHORT TERM GOAL #1   Title Pt will use aphasia compensations during 5-8 minutes conversation with occasional min cues.    Time 10    Period --   sessions   Status New      SLP SHORT TERM GOAL #2   Title Pt will demonstrate dysarthria HEP with rare min cues.    Time 10    Period --   sessions   Status New      SLP SHORT TERM GOAL #3   Title Patient will use compensations for dysarthria in sentence responses for >95% intelligibility.    Time 10    Period --   sessions   Status New      SLP SHORT TERM GOAL #4   Title Pt will complete further assessment of cognitive-communication PRN.    Time 10    Period --   sessions   Status New              SLP Long Term Goals - 11/21/21 1445       SLP LONG TERM GOAL #1   Title Pt will use aphasia compensations effectively during 20 minutes mod complex conversation with modified independence.    Time 12    Period Weeks    Status New    Target Date 02/18/22      SLP LONG TERM GOAL #2   Title Pt will use aphasia compensations with modified independence to resolve communication breakdown at least 3 occasions outside of ST, per pt/spouse report.    Time 12    Period Weeks    Status New    Target Date 02/18/22      SLP LONG TERM  GOAL #3   Title Patient will use compensations for dysarthria in 20 minute conversation for >95% intelligibility with no more than 2 repetitions necessary.    Time 12    Period Weeks    Status New    Target Date 02/18/22      SLP LONG TERM GOAL #4   Title Patient will demonstrate swallowing precautions independently x3 sessions with no overt s/sx aspiration.    Time 12  Period Weeks    Status New    Target Date 02/18/22              Plan - 01/02/22 1530     Clinical Impression Statement Patient presents with moderate dysarthria, mild aphasia, and mild oropharyngeal dysphagia s/p CVA. Speech/voice is characterized by hoarse vocal quality, reduced breath support, imprecise lingual consonants and atypical resonatory quality. Skilled SLP intervention targeted dysarthria with improved intelligibility via conversational tasks. Pt demonstrated improved intelligbility and use of strategies across session (slow rate, overarticulation, loudness) , though pt continues to benefit from min-mod verbal cues to identify and improve dysarthric speech. Targeted anomia and word finding via structured categorical tasks. Pt demonstrated carryover of trained strategy with semantic feature analysis.  I recommend skilled ST to improve intelligibility, verbal expression and swallowing safety necessary for increased communicatory success and independence in the home and community environments.    Speech Therapy Frequency 2x / week    Duration 12 weeks    Treatment/Interventions Aspiration precaution training;Environmental controls;Language facilitation;Cueing hierarchy;Oral motor exercises;SLP instruction and feedback;Pharyngeal strengthening exercises;Compensatory techniques;Cognitive reorganization;Functional tasks;Compensatory strategies;Diet toleration management by SLP;Internal/external aids;Multimodal communcation approach;Patient/family education    Potential to Achieve Goals Good    Consulted and Agree  with Plan of Care Patient;Family member/caregiver             Patient will benefit from skilled therapeutic intervention in order to improve the following deficits and impairments:   Dysarthria and anarthria  Aphasia    Problem List Patient Active Problem List   Diagnosis Date Noted   Overweight (BMI 25.0-29.9) 10/11/2021   Hypertriglyceridemia 10/11/2021   Stroke ([place]) 10/10/2021   Diabetes mellitus without complication ([place]) 97/67/3419   HTN (hypertension) 10/10/2021   Pyuria 12/25/2016   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   Medicare annual wellness visit, initial 07/17/2016   B12 deficiency 07/17/2016   History of CVA (cerebrovascular accident) without residual deficits 05/14/2016   PIN III (prostatic intraepithelial neoplasia III) 04/11/2015   Personal history of other malignant neoplasm of skin 09/16/2014   History of nonmelanoma skin cancer 09/16/2014   History of colonic polyps 08/23/2014   Diverticulosis of large intestine 08/23/2014   Benign hematuria 07/20/2014   Type 2 diabetes mellitus ([place]) 07/04/2014   Malignant neoplasm of bladder ([place]) 07/04/2014   DM (diabetes mellitus) type II controlled with renal manifestation ([place]) 07/04/2014   Benign essential hypertension 07/04/2014   Urge incontinence 12/29/2013   Malignant neoplasm of lateral wall of urinary bladder ([place]) 09/09/2012   Incomplete emptying of bladder 09/09/2012   Enlarged prostate with lower urinary tract symptoms (LUTS) 09/09/2012   Elevated prostate specific antigen (PSA) 09/09/2012   Chronic prostatitis 09/09/2012   Carcinoma in situ of prostate 09/09/2012   Kidney stone 09/09/2012   Benign prostatic hyperplasia with urinary obstruction 09/09/2012   [place] L. Maxwell Lemen, M.A. [place] [phone]   [place], [place] 01/02/2022, 3:35 PM  [place] MAIN [facility] SERVICES [address] [place], [place], [address] Phone: [phone]   Fax:  [phone]   Name: Casey Adkins MRN: [id number] Date of Birth: [birth year]

## 2022-01-08 ENCOUNTER — Ambulatory Visit: Payer: Medicare Other | Admitting: Speech Pathology

## 2022-01-08 ENCOUNTER — Other Ambulatory Visit: Payer: Self-pay

## 2022-01-08 DIAGNOSIS — R471 Dysarthria and anarthria: Secondary | ICD-10-CM

## 2022-01-08 DIAGNOSIS — R4701 Aphasia: Secondary | ICD-10-CM | POA: Diagnosis not present

## 2022-01-08 NOTE — Therapy (Signed)
[place] MAIN [facility] SERVICES [address] [place], [place], [address] Phone: [phone]   Fax:  [phone]  Speech Language Pathology Treatment  Patient Details  Name: Casey Adkins MRN: [id number] Date of Birth: [birth year] Referring Provider (SLP): Rusty Aus, MD   Encounter Date: 01/08/2022   End of Session - 01/08/22 1214     Visit Number 9    Number of Visits 25    Date for SLP Re-Evaluation 02/18/22    SLP Start Time 0900    SLP Stop Time  1000    SLP Time Calculation (min) 60 min    Activity Tolerance Patient tolerated treatment well             Past Medical History:  Diagnosis Date   Allergic rhinitis    Arthritis    Cancer ([place])    Bladder Cancer   Cancer [facility])    Prostate Cancer   Diabetes mellitus without complication ([place])    Diverticulosis    Dysphagia    GERD (gastroesophageal reflux disease)    History of kidney stones    h/o   Hypertension    Stroke [facility]) 2006   possible light stroke-no deficits    Past Surgical History:  Procedure Laterality Date   CYSTOSCOPY W/ RETROGRADES Bilateral 06/07/2019   Procedure: CYSTOSCOPY WITH RETROGRADE PYELOGRAM;  Surgeon: Hollice Espy, MD;  Location: [facility] ORS;  Service: Urology;  Laterality: Bilateral;   CYSTOSCOPY WITH BIOPSY N/A 06/07/2019   Procedure: CYSTOSCOPY WITH Bladder BIOPSY;  Surgeon: Hollice Espy, MD;  Location: [facility] ORS;  Service: Urology;  Laterality: N/A;   ESOPHAGOGASTRODUODENOSCOPY N/A 04/21/2015   Procedure: ESOPHAGOGASTRODUODENOSCOPY (EGD);  Surgeon: Hulen Luster, MD;  Location: [facility] ENDOSCOPY;  Service: Gastroenterology;  Laterality: N/A;   LITHOTRIPSY     SINUS SURGERY WITH INSTATRAK  2008   STAPEDES SURGERY Left age 22   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   Subjective Assessment - 01/08/22 1202     Subjective Pt reports frustration with using  his computer.    Patient is accompained by: Family member   spouse   Currently in Pain? No/denies                   ADULT SLP TREATMENT - 01/08/22 1207       General Information   Behavior/Cognition Alert;Cooperative;Pleasant mood    HPI Casey Adkins is an 88 y.o. male referred for MBS by his PCP Dr. Emily Filbert. Pt admitted 10/10/21- 10/11/21 for CVA. 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 Unable to obtain MRI due to ear implant. Hx of esophageal dysphagia requiring dilation in the past; reports 1 year hx of worsening dysphagia prior to CVA. Neurology noted right upper motor neuron pattern weakness, suspected small vessel lacunar infarction. 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 Wife assists with history given pt's dysarthria, aphasia.      Treatment Provided   Treatment provided Cognitive-Linquistic      Cognitive-Linquistic Treatment   Treatment focused on Dysarthria;Aphasia    Skilled Treatment Wife provided pt's homework (written in her hand; she reports pt did this verbally). Pt reports "I don't talk much" at home and states this is consistent with his baseline. He has reduced social outings (particularly with his bowling  group) but this is because he does not feel comfortable making the drive to [place]. Targeted naming/wordfinding and dysarthria using responsive naming (95% accuracy), analogies (90% accuracy, with repetition/written cues for words with high-frequency sounds 2/2 hearing loss). During these tasks, intelligibility was 80%, with occasional min verbal cues necessary to repeat using SLOP strategies. Discussed further pt's frustration with using the computer, which he reports he was able to do prior to CVA. Reports difficulty spelling and reading. Targeted spelling at word level with 3 item sequences; pt placed 3 words in correct order  with mod-max cues needed for sequencing, and mod cues for spelling (accuracy 60% on first attempt, improves to 80% with double-checking).      Assessment / Recommendations / Plan   Plan Continue with current plan of care      Progression Toward Goals   Progression toward goals Progressing toward goals              SLP Education - 01/08/22 1214     Education Details aphasia can cause difficulties with all language modalities, including reading/writing    Person(s) Educated Patient;Spouse    Methods Explanation    Comprehension Verbalized understanding              SLP Short Term Goals - 11/21/21 1443       SLP SHORT TERM GOAL #1   Title Pt will use aphasia compensations during 5-8 minutes conversation with occasional min cues.    Time 10    Period --   sessions   Status New      SLP SHORT TERM GOAL #2   Title Pt will demonstrate dysarthria HEP with rare min cues.    Time 10    Period --   sessions   Status New      SLP SHORT TERM GOAL #3   Title Patient will use compensations for dysarthria in sentence responses for >95% intelligibility.    Time 10    Period --   sessions   Status New      SLP SHORT TERM GOAL #4   Title Pt will complete further assessment of cognitive-communication PRN.    Time 10    Period --   sessions   Status New              SLP Long Term Goals - 11/21/21 1445       SLP LONG TERM GOAL #1   Title Pt will use aphasia compensations effectively during 20 minutes mod complex conversation with modified independence.    Time 12    Period Weeks    Status New    Target Date 02/18/22      SLP LONG TERM GOAL #2   Title Pt will use aphasia compensations with modified independence to resolve communication breakdown at least 3 occasions outside of ST, per pt/spouse report.    Time 12    Period Weeks    Status New    Target Date 02/18/22      SLP LONG TERM GOAL #3   Title Patient will use compensations for dysarthria in 20 minute  conversation for >95% intelligibility with no more than 2 repetitions necessary.    Time 12    Period Weeks    Status New    Target Date 02/18/22      SLP LONG TERM GOAL #4   Title Patient will demonstrate swallowing precautions independently x3 sessions with no overt s/sx aspiration.    Time 12  Period Weeks    Status New    Target Date 02/18/22              Plan - 01/08/22 1215     Clinical Impression Statement Patient presents with moderate dysarthria, mild aphasia, and mild oropharyngeal dysphagia s/p CVA. Speech/voice is characterized by hoarse vocal quality, reduced breath support, imprecise lingual consonants and atypical resonatory quality. Targeted dysarthria, anomia and word finding via structured categorical tasks. Pt requires intermittent cues for carryover in conversation. Reporting difficulty with reading/spelling leading to difficulty/frustration with using his computer. Will consider further assessment of language with reading comprehension, written expression. I recommend skilled ST to improve intelligibility, verbal expression and swallowing safety necessary for increased communicatory success and independence in the home and community environments.    Speech Therapy Frequency 2x / week    Duration 12 weeks    Treatment/Interventions Aspiration precaution training;Environmental controls;Language facilitation;Cueing hierarchy;Oral motor exercises;SLP instruction and feedback;Pharyngeal strengthening exercises;Compensatory techniques;Cognitive reorganization;Functional tasks;Compensatory strategies;Diet toleration management by SLP;Internal/external aids;Multimodal communcation approach;Patient/family education    Potential to Achieve Goals Good    Consulted and Agree with Plan of Care Patient;Family member/caregiver             Patient will benefit from skilled therapeutic intervention in order to improve the following deficits and impairments:   Dysarthria and  anarthria  Aphasia    Problem List Patient Active Problem List   Diagnosis Date Noted   Overweight (BMI 25.0-29.9) 10/11/2021   Hypertriglyceridemia 10/11/2021   Stroke ([place]) 10/10/2021   Diabetes mellitus without complication ([place]) 41/63/8453   HTN (hypertension) 10/10/2021   Pyuria 12/25/2016   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   Medicare annual wellness visit, initial 07/17/2016   B12 deficiency 07/17/2016   History of CVA (cerebrovascular accident) without residual deficits 05/14/2016   PIN III (prostatic intraepithelial neoplasia III) 04/11/2015   Personal history of other malignant neoplasm of skin 09/16/2014   History of nonmelanoma skin cancer 09/16/2014   History of colonic polyps 08/23/2014   Diverticulosis of large intestine 08/23/2014   Benign hematuria 07/20/2014   Type 2 diabetes mellitus ([place]) 07/04/2014   Malignant neoplasm of bladder ([place]) 07/04/2014   DM (diabetes mellitus) type II controlled with renal manifestation ([place]) 07/04/2014   Benign essential hypertension 07/04/2014   Urge incontinence 12/29/2013   Malignant neoplasm of lateral wall of urinary bladder ([place]) 09/09/2012   Incomplete emptying of bladder 09/09/2012   Enlarged prostate with lower urinary tract symptoms (LUTS) 09/09/2012   Elevated prostate specific antigen (PSA) 09/09/2012   Chronic prostatitis 09/09/2012   Carcinoma in situ of prostate 09/09/2012   Kidney stone 09/09/2012   Benign prostatic hyperplasia with urinary obstruction 09/09/2012   Deneise Lever, MS, CCC-SLP Speech-Language Pathologist  [place], [place] 01/08/2022, 12:23 PM  [place] MAIN [place] [address] [place], [place], [address] Phone: [phone]   Fax:  [phone]   Name: LEIB ELAHI MRN: [id number] Date of Birth: September 21, 1934

## 2022-01-10 ENCOUNTER — Ambulatory Visit: Payer: Medicare Other | Admitting: Speech Pathology

## 2022-01-10 ENCOUNTER — Other Ambulatory Visit: Payer: Self-pay

## 2022-01-10 DIAGNOSIS — R4701 Aphasia: Secondary | ICD-10-CM

## 2022-01-10 DIAGNOSIS — R471 Dysarthria and anarthria: Secondary | ICD-10-CM

## 2022-01-10 DIAGNOSIS — R41841 Cognitive communication deficit: Secondary | ICD-10-CM

## 2022-01-10 NOTE — Therapy (Addendum)
[place] MAIN [facility] SERVICES [address] [place], [place], [address] Phone: [phone]   Fax:  [phone]  Speech Language Pathology Treatment and Progress Note  Patient Details  Name: Casey Adkins MRN: [id number] Date of Birth: 08/31/1934 Referring Provider (SLP): Rusty Aus, MD Speech Therapy Progress Note  Dates of Reporting Period: 11/20/2021 to 01/10/2022  Objective: Patient has been seen for 10 speech therapy sessions this reporting period targeting dsyarthria, dysphagia, and aphasia. Patient is making progress toward LTGs and met 4/4 STGs this reporting period. Goals updated. See skilled intervention, clinical impressions, and goals below for details.   Encounter Date: 01/10/2022   End of Session - 01/10/22 1240     Visit Number 10    Number of Visits 25    Date for SLP Re-Evaluation 02/18/22    SLP Start Time 1000    SLP Stop Time  1100    SLP Time Calculation (min) 60 min    Activity Tolerance Patient tolerated treatment well             Past Medical History:  Diagnosis Date   Allergic rhinitis    Arthritis    Cancer ([place])    Bladder Cancer   Cancer ([place])    Prostate Cancer   Diabetes mellitus without complication ([place])    Diverticulosis    Dysphagia    GERD (gastroesophageal reflux disease)    History of kidney stones    h/o   Hypertension    Stroke ([place]) 2006   possible light stroke-no deficits    Past Surgical History:  Procedure Laterality Date   CYSTOSCOPY W/ RETROGRADES Bilateral 06/07/2019   Procedure: CYSTOSCOPY WITH RETROGRADE PYELOGRAM;  Surgeon: Hollice Espy, MD;  Location: [facility] ORS;  Service: Urology;  Laterality: Bilateral;   CYSTOSCOPY WITH BIOPSY N/A 06/07/2019   Procedure: CYSTOSCOPY WITH Bladder BIOPSY;  Surgeon: Hollice Espy, MD;  Location: [facility] ORS;  Service: Urology;  Laterality: N/A;   ESOPHAGOGASTRODUODENOSCOPY N/A 04/21/2015   Procedure: ESOPHAGOGASTRODUODENOSCOPY (EGD);   Surgeon: Hulen Luster, MD;  Location: [facility] ENDOSCOPY;  Service: Gastroenterology;  Laterality: N/A;   LITHOTRIPSY     SINUS SURGERY WITH INSTATRAK  2008   STAPEDES SURGERY Left age 81   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   Subjective Assessment - 01/10/22 1232     Subjective "I typed it but it went away."    Patient is accompained by: Family member   spouse   Currently in Pain? No/denies                   ADULT SLP TREATMENT - 01/10/22 1232       General Information   Behavior/Cognition Alert;Cooperative;Pleasant mood    HPI Casey Adkins is an 88 y.o. male referred for MBS by his PCP Dr. Emily Filbert. Pt admitted 10/10/21- 10/11/21 for CVA. 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 Unable to obtain MRI due to ear implant. Hx of esophageal dysphagia requiring dilation in the past; reports 1 year hx of worsening dysphagia prior to CVA. Neurology noted right upper motor neuron pattern weakness, suspected small vessel lacunar infarction. 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 Wife assists with history given pt's dysarthria, aphasia.      Treatment Provided   Treatment  provided Cognitive-Linquistic      Cognitive-Linquistic Treatment   Treatment focused on Dysarthria;Aphasia    Skilled Treatment Pt completed homework: attempted to type but got frustrated with the computer so he wrote by hand. Completed assessment of reading/writing with Part 2 of the Western Aphasia Battery-revised (WAB-R). Reading impaired at paragraph level (reading total 84/100). Spelling/syntax errors in written expression at phrase/sentence level (writing total 73/100. Overall language quotient score 83.1 (mild). Pt noted to have difficulty with working memory, alternating attention during language  testing. Tremor impacts speed/ legibility of writing, so pt was allowed to type portions of the writing assessment. He also needed visual cues/repetition of words with high frequency sounds due to hearing loss. Patient demonstrated dysarthria HEP with good technique and rare min cues for slower rate. Intelligibility in sentence responses 95%; when progressing to paragraph level reading pt required more moderate cues for slower rate and pausing.      Assessment / Recommendations / Plan   Plan Continue with current plan of care      Progression Toward Goals   Progression toward goals Progressing toward goals              SLP Education - 01/10/22 1239     Education Details suspect attention, working memory, fine motor movements also contributing to difficulties with his computer. Pt would benefit from seeing OT, as well as PT (for strength/balance)    Person(s) Educated Patient;Spouse    Methods Explanation    Comprehension Verbalized understanding              SLP Short Term Goals - 01/10/22 1242       SLP SHORT TERM GOAL #1   Title Pt will use aphasia compensations during 5-8 minutes conversation with occasional min cues.    Time 10    Period --   sessions   Status Achieved      SLP SHORT TERM GOAL #2   Title Pt will demonstrate dysarthria HEP with rare min cues.    Time 10    Period --   sessions   Status Achieved      SLP SHORT TERM GOAL #3   Title Patient will use compensations for dysarthria in sentence responses for >95% intelligibility.    Time 10    Period --   sessions   Status Achieved      SLP SHORT TERM GOAL #4   Title Pt will complete further assessment of cognitive-communication PRN.    Time 10    Period --   sessions   Status Achieved      SLP SHORT TERM GOAL #5   Title Patient will use compensations for dysarthria in paragraph-level reading for >95% intelligibility.    Time 10    Period --   sessions   Status New      Additional Short Term  Goals   Additional Short Term Goals Yes      SLP SHORT TERM GOAL #6   Title Patient will demonstrate alternating attention over 10 minutes, completing a cognitive-linguistic task with 90% accuracy.    Time 10    Period --   sessions   Status New              SLP Long Term Goals - 01/10/22 1242       SLP LONG TERM GOAL #1   Title Pt will use aphasia compensations effectively during 20 minutes mod complex conversation with modified independence.    Time 12  Period Weeks    Status On-going      SLP LONG TERM GOAL #2   Title Pt will use aphasia compensations with modified independence to resolve communication breakdown at least 3 occasions outside of ST, per pt/spouse report.    Time 12    Period Weeks    Status On-going      SLP LONG TERM GOAL #3   Title Patient will use compensations for dysarthria in 20 minute conversation for >95% intelligibility with no more than 2 repetitions necessary.    Time 12    Period Weeks    Status On-going      SLP LONG TERM GOAL #4   Title Patient will demonstrate swallowing precautions independently x3 sessions with no overt s/sx aspiration.    Time 12    Period Weeks    Status On-going              Plan - 01/10/22 1241     Clinical Impression Statement Patient presents with moderate dysarthria, mild aphasia, and mild oropharyngeal dysphagia s/p CVA. Intelligibility improving in structured tasks, 95% at sentence level today. Pt/spouse report improved swallowing function, deny overt s/sx aspiration when using trained strategies/precautions. Assessment of written expression and reading comprehension reveals deficits (paragraph level reading, phrase level writing), as well as contributing impairments of alternating attention, working memory. Continue to encourage pt to consider OT/PT evals, however pt declines this. Noted difficulty with sit-to-stand as well as unsteadiness ambulating to ST room today. I recommend skilled ST to improve  intelligibility, verbal expression, cognitive communication skills and swallowing safety necessary for increased communicatory success and independence in the home and community environments.    Speech Therapy Frequency 2x / week    Duration 12 weeks    Treatment/Interventions Aspiration precaution training;Environmental controls;Language facilitation;Cueing hierarchy;Oral motor exercises;SLP instruction and feedback;Pharyngeal strengthening exercises;Compensatory techniques;Cognitive reorganization;Functional tasks;Compensatory strategies;Diet toleration management by SLP;Internal/external aids;Multimodal communcation approach;Patient/family education    Potential to Achieve Goals Good    Consulted and Agree with Plan of Care Patient;Family member/caregiver             Patient will benefit from skilled therapeutic intervention in order to improve the following deficits and impairments:   Dysarthria and anarthria  Aphasia  Cognitive communication deficit    Problem List Patient Active Problem List   Diagnosis Date Noted   Overweight (BMI 25.0-29.9) 10/11/2021   Hypertriglyceridemia 10/11/2021   Stroke ([place]) 10/10/2021   Diabetes mellitus without complication ([place]) 05/24/1600   HTN (hypertension) 10/10/2021   Pyuria 12/25/2016   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   Medicare annual wellness visit, initial 07/17/2016   B12 deficiency 07/17/2016   History of CVA (cerebrovascular accident) without residual deficits 05/14/2016   PIN III (prostatic intraepithelial neoplasia III) 04/11/2015   Personal history of other malignant neoplasm of skin 09/16/2014   History of nonmelanoma skin cancer 09/16/2014   History of colonic polyps 08/23/2014   Diverticulosis of large intestine 08/23/2014   Benign hematuria 07/20/2014   Type 2 diabetes mellitus ([place]) 07/04/2014   Malignant neoplasm of bladder ([place]) 07/04/2014   DM (diabetes mellitus) type II controlled with renal manifestation  ([place]) 07/04/2014   Benign essential hypertension 07/04/2014   Urge incontinence 12/29/2013   Malignant neoplasm of lateral wall of urinary bladder ([place]) 09/09/2012   Incomplete emptying of bladder 09/09/2012   Enlarged prostate with lower urinary tract symptoms (LUTS) 09/09/2012   Elevated prostate specific antigen (PSA) 09/09/2012   Chronic prostatitis 09/09/2012   Carcinoma in situ of  prostate 09/09/2012   Kidney stone 09/09/2012   Benign prostatic hyperplasia with urinary obstruction 09/09/2012   Deneise Lever, [place], [place], [place] 01/10/2022, 12:48 PM  [place] MAIN [facility] SERVICES [address] [place], [place], [address] Phone: [phone]   Fax:  [phone]   Name: MARSELINO SLAYTON MRN: [id number] Date of Birth: 07/23/1934

## 2022-01-15 ENCOUNTER — Other Ambulatory Visit: Payer: Self-pay

## 2022-01-15 ENCOUNTER — Ambulatory Visit: Payer: Medicare Other | Admitting: Speech Pathology

## 2022-01-15 DIAGNOSIS — R41841 Cognitive communication deficit: Secondary | ICD-10-CM

## 2022-01-15 DIAGNOSIS — R4701 Aphasia: Secondary | ICD-10-CM

## 2022-01-15 DIAGNOSIS — R471 Dysarthria and anarthria: Secondary | ICD-10-CM

## 2022-01-16 NOTE — Therapy (Signed)
[place] MAIN [facility] SERVICES [address] [place], [place], [address] Phone: [phone]   Fax:  [phone]  Speech Language Pathology Treatment  Patient Details  Name: Casey Adkins MRN: [id number] Date of Birth: 02-01-1934 Referring Provider (SLP): Rusty Aus, MD   Encounter Date: 01/15/2022   End of Session - 01/16/22 0943     Visit Number 11    Number of Visits 25    Date for SLP Re-Evaluation 02/18/22    SLP Start Time 1100    SLP Stop Time  8588    SLP Time Calculation (min) 65 min    Activity Tolerance Patient tolerated treatment well;Other (comment)   session tasks limited by pt frustration tolerance            Past Medical History:  Diagnosis Date   Allergic rhinitis    Arthritis    Cancer ([place])    Bladder Cancer   Cancer [facility])    Prostate Cancer   Diabetes mellitus without complication ([place])    Diverticulosis    Dysphagia    GERD (gastroesophageal reflux disease)    History of kidney stones    h/o   Hypertension    Stroke [facility]) 2006   possible light stroke-no deficits    Past Surgical History:  Procedure Laterality Date   CYSTOSCOPY W/ RETROGRADES Bilateral 06/07/2019   Procedure: CYSTOSCOPY WITH RETROGRADE PYELOGRAM;  Surgeon: Hollice Espy, MD;  Location: [facility] ORS;  Service: Urology;  Laterality: Bilateral;   CYSTOSCOPY WITH BIOPSY N/A 06/07/2019   Procedure: CYSTOSCOPY WITH Bladder BIOPSY;  Surgeon: Hollice Espy, MD;  Location: [facility] ORS;  Service: Urology;  Laterality: N/A;   ESOPHAGOGASTRODUODENOSCOPY N/A 04/21/2015   Procedure: ESOPHAGOGASTRODUODENOSCOPY (EGD);  Surgeon: Hulen Luster, MD;  Location: [facility] ENDOSCOPY;  Service: Gastroenterology;  Laterality: N/A;   LITHOTRIPSY     SINUS SURGERY WITH INSTATRAK  2008   STAPEDES SURGERY Left age 5   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   Subjective Assessment  - 01/16/22 0927     Subjective Pt struggles to get up from chair in waiting room    Patient is accompained by: Family member   spouse   Currently in Pain? No/denies                   ADULT SLP TREATMENT - 01/16/22 0927       General Information   Behavior/Cognition Alert;Cooperative;Pleasant mood    HPI Casey Adkins is an 88 y.o. male referred for MBS by his PCP Dr. Emily Filbert. Pt admitted 10/10/21- 10/11/21 for CVA. 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 Unable to obtain MRI due to ear implant. Hx of esophageal dysphagia requiring dilation in the past; reports 1 year hx of worsening dysphagia prior to CVA. Neurology noted right upper motor neuron pattern weakness, suspected small vessel lacunar infarction. 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 Wife assists with history given pt's dysarthria, aphasia.      Treatment Provided   Treatment provided Cognitive-Linquistic      Cognitive-Linquistic Treatment   Treatment focused on Dysarthria;Aphasia    Skilled Treatment Patient completed homework (spelling fill-ins). Required mod cues to ID 3 errors, which he corrected with min A. Targeted alternating attention in simple-mod complex linguistic task (alphabetizing  word lists). Initially, pt required mod-max cues for task learning. Once task learned, pt completed 4 tasks 80% accuracy. Accuracy began to decrease with 5th list (after approximately 10 minutes) and pt exhibited signs of frustration. SLP cued pt for taking a short break, then demonstrated and cued pt through use of a strategy, however pt frustration tolerance remained low. Modified task to reduce language burden to simple alternating attention (symbols), however pt required max cues. Afterwards, discussed how pt's attention and frustration are impacting him in this task, and asked pt/spouse in what  activities pt is getting frustrated at home. (SLP gave example of pt using his computer). Encouraged them to consider this and share with SLP, so that functional tasks can be incorporated into pt's sessions. Targeted reading comprehension and intelligibility at paragraph level. Initially article of pt interest Casey Adkins) contributed to pt frustration (pronouncing unfamiliar names). With modified task, pt used compensations for dysarthria with frequent mod cues (mostly for slowing rate, pausing). At end of session, pt reports he is not sure he is making progress. SLP educated on pt's progress so far, provided encouragement that in order to improve things that are hard for him, therapy tasks must strike a balance between increasing difficulty without being too difficult, and that tasks that are slightly challenging for pt are important to help him progress.      Assessment / Recommendations / Plan   Plan Continue with current plan of care      Progression Toward Goals   Progression toward goals Progressing toward goals   frustration tolerance is a limiting factor             SLP Education - 01/16/22 0943     Education Details continue to recommend OT/PT    Person(s) Educated Patient;Spouse    Methods Explanation    Comprehension Verbalized understanding              SLP Short Term Goals - 01/10/22 1242       SLP SHORT TERM GOAL #1   Title Pt will use aphasia compensations during 5-8 minutes conversation with occasional min cues.    Time 10    Period --   sessions   Status Achieved      SLP SHORT TERM GOAL #2   Title Pt will demonstrate dysarthria HEP with rare min cues.    Time 10    Period --   sessions   Status Achieved      SLP SHORT TERM GOAL #3   Title Patient will use compensations for dysarthria in sentence responses for >95% intelligibility.    Time 10    Period --   sessions   Status Achieved      SLP SHORT TERM GOAL #4   Title Pt will complete further  assessment of cognitive-communication PRN.    Time 10    Period --   sessions   Status Achieved      SLP SHORT TERM GOAL #5   Title Patient will use compensations for dysarthria in paragraph-level reading for >95% intelligibility.    Time 10    Period --   sessions   Status New      Additional Short Term Goals   Additional Short Term Goals Yes      SLP SHORT TERM GOAL #6   Title Patient will demonstrate alternating attention over 10 minutes, completing a cognitive-linguistic task with 90% accuracy.    Time 10    Period --   sessions  Status New              SLP Long Term Goals - 01/10/22 1242       SLP LONG TERM GOAL #1   Title Pt will use aphasia compensations effectively during 20 minutes mod complex conversation with modified independence.    Time 12    Period Weeks    Status On-going      SLP LONG TERM GOAL #2   Title Pt will use aphasia compensations with modified independence to resolve communication breakdown at least 3 occasions outside of ST, per pt/spouse report.    Time 12    Period Weeks    Status On-going      SLP LONG TERM GOAL #3   Title Patient will use compensations for dysarthria in 20 minute conversation for >95% intelligibility with no more than 2 repetitions necessary.    Time 12    Period Weeks    Status On-going      SLP LONG TERM GOAL #4   Title Patient will demonstrate swallowing precautions independently x3 sessions with no overt s/sx aspiration.    Time 12    Period Weeks    Status On-going              Plan - 01/16/22 0272     Clinical Impression Statement Patient presents with moderate dysarthria, mild aphasia, and mild oropharyngeal dysphagia s/p CVA. Cognitive deficits including impaired higher-level attention, awareness are also noted in therapy tasks today. Pt with reduced frustration tolerance which limited him in session today and necessitated frequent modification of tasks. Pt is making good progress toward goals,  however at times struggles to see his own progress or see benefits of therapeutic intervention. Recommend PT and OT evaluations but pt declines this despite encouragement from SLP and spouse. Patient reports he is interested in taking a break from Casey Adkins; to discuss with pt and spouse further next session. I recommend skilled ST to improve intelligibility, verbal expression and swallowing safety necessary for increased communicatory success and independence in the home and community environments.    Speech Therapy Frequency 2x / week    Duration 12 weeks    Treatment/Interventions Aspiration precaution training;Environmental controls;Language facilitation;Cueing hierarchy;Oral motor exercises;SLP instruction and feedback;Pharyngeal strengthening exercises;Compensatory techniques;Cognitive reorganization;Functional tasks;Compensatory strategies;Diet toleration management by SLP;Internal/external aids;Multimodal communcation approach;Patient/family education    Potential to Achieve Goals Good    Consulted and Agree with Plan of Care Patient;Family member/caregiver             Patient will benefit from skilled therapeutic intervention in order to improve the following deficits and impairments:   Dysarthria and anarthria  Aphasia  Cognitive communication deficit    Problem List Patient Active Problem List   Diagnosis Date Noted   Overweight (BMI 25.0-29.9) 10/11/2021   Hypertriglyceridemia 10/11/2021   Stroke ([place]) 10/10/2021   Diabetes mellitus without complication ([place]) 53/66/4403   HTN (hypertension) 10/10/2021   Pyuria 12/25/2016   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   Medicare annual wellness visit, initial 07/17/2016   B12 deficiency 07/17/2016   History of CVA (cerebrovascular accident) without residual deficits 05/14/2016   PIN III (prostatic intraepithelial neoplasia III) 04/11/2015   Personal history of other malignant neoplasm of skin 09/16/2014   History of nonmelanoma skin  cancer 09/16/2014   History of colonic polyps 08/23/2014   Diverticulosis of large intestine 08/23/2014   Benign hematuria 07/20/2014   Type 2 diabetes mellitus ([place]) 07/04/2014   Malignant neoplasm of bladder ([place]) 07/04/2014  DM (diabetes mellitus) type II controlled with renal manifestation ([place]) 07/04/2014   Benign essential hypertension 07/04/2014   Urge incontinence 12/29/2013   Malignant neoplasm of lateral wall of urinary bladder ([place]) 09/09/2012   Incomplete emptying of bladder 09/09/2012   Enlarged prostate with lower urinary tract symptoms (LUTS) 09/09/2012   Elevated prostate specific antigen (PSA) 09/09/2012   Chronic prostatitis 09/09/2012   Carcinoma in situ of prostate 09/09/2012   Kidney stone 09/09/2012   Benign prostatic hyperplasia with urinary obstruction 09/09/2012   Casey Lever, MS, CCC-SLP Speech-Language Pathologist [phone]  Aliene Altes, [place] 01/16/2022, 9:48 AM  [place] MAIN [facility] SERVICES [address] [place], [place], [address] Phone: [phone]   Fax:  [phone]   Name: Casey Adkins MRN: [id number] Date of Birth: 1934-06-23

## 2022-01-17 ENCOUNTER — Ambulatory Visit: Payer: Medicare Other | Admitting: Speech Pathology

## 2022-01-17 ENCOUNTER — Other Ambulatory Visit: Payer: Self-pay

## 2022-01-17 DIAGNOSIS — R4701 Aphasia: Secondary | ICD-10-CM | POA: Diagnosis not present

## 2022-01-17 DIAGNOSIS — R41841 Cognitive communication deficit: Secondary | ICD-10-CM

## 2022-01-17 DIAGNOSIS — R471 Dysarthria and anarthria: Secondary | ICD-10-CM

## 2022-01-17 NOTE — Therapy (Signed)
[place] MAIN [facility] SERVICES [address] [place], [place], [address] Phone: [phone]   Fax:  [phone]  Speech Language Pathology Treatment  Patient Details  Name: Casey Adkins MRN: [id number] Date of Birth: 02/11/1934 Referring Provider (SLP): Rusty Aus, MD   Encounter Date: 01/17/2022   End of Session - 01/17/22 1415     Visit Number 12    Number of Visits 25    Date for SLP Re-Evaluation 02/18/22    SLP Start Time 1004    SLP Stop Time  1100    SLP Time Calculation (min) 56 min    Activity Tolerance Patient tolerated treatment well             Past Medical History:  Diagnosis Date   Allergic rhinitis    Arthritis    Cancer ([place])    Bladder Cancer   Cancer [facility])    Prostate Cancer   Diabetes mellitus without complication ([place])    Diverticulosis    Dysphagia    GERD (gastroesophageal reflux disease)    History of kidney stones    h/o   Hypertension    Stroke [facility]) 2006   possible light stroke-no deficits    Past Surgical History:  Procedure Laterality Date   CYSTOSCOPY W/ RETROGRADES Bilateral 06/07/2019   Procedure: CYSTOSCOPY WITH RETROGRADE PYELOGRAM;  Surgeon: Hollice Espy, MD;  Location: [facility] ORS;  Service: Urology;  Laterality: Bilateral;   CYSTOSCOPY WITH BIOPSY N/A 06/07/2019   Procedure: CYSTOSCOPY WITH Bladder BIOPSY;  Surgeon: Hollice Espy, MD;  Location: [facility] ORS;  Service: Urology;  Laterality: N/A;   ESOPHAGOGASTRODUODENOSCOPY N/A 04/21/2015   Procedure: ESOPHAGOGASTRODUODENOSCOPY (EGD);  Surgeon: Hulen Luster, MD;  Location: 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   Subjective Assessment - 01/17/22 1417     Subjective "Do I have more sessions?"     Patient is accompained by: Family member   spouse present for session   Currently in Pain? No/denies    Pain Score 0-No pain                   ADULT SLP TREATMENT - 01/17/22 0001       General Information   Behavior/Cognition Alert;Cooperative;Pleasant mood    HPI Casey Adkins is an 88 y.o. male referred for MBS by his PCP Dr. Emily Adkins. Pt admitted 10/10/21- 10/11/21 for CVA. 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 Unable to obtain MRI due to ear implant. Hx of esophageal dysphagia requiring dilation in the past; reports 1 year hx of worsening dysphagia prior to CVA. Neurology noted right upper motor neuron pattern weakness, suspected small vessel lacunar infarction. 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 Wife assists with history given pt's dysarthria, aphasia.      Treatment Provided   Treatment provided Cognitive-Linquistic      Cognitive-Linquistic Treatment   Treatment focused on Dysarthria;Aphasia;Patient/family/caregiver education    Skilled Treatment Pt brought his completed homework. Targeted naming/wordfinding and dysarthria in verbal description task (modified taboo), pt provided appropriate verbal descriptions with 80% accuracy.  Pt benefited from occasional mod cues  to use semantic feature analysis to describe target words (ie description, category, use.) Pt provided correct target word with 70% accuracy, increased to 90% acc with usual min-mod verbal cues. Pt intelligibilty was ~85% accuracy, pt used SLOP strategies to repeat unintelligible speech in 4/4 opportunities with min verbal cues. After about 15 minutes, patient transitioned to ask about his progress and future ST sessions. SLP discussed patient's progress so far and reviewed pt's STGs and LTGs. Provided encouragement on pts progress and recommended continued therapy sessions to improve  things that are challenging for him. Pt reports having difficulty with drinking liquids and increased drooling since his stroke. Pt re-educated on taking small sips and discussed adding oral dysphagia HEP. Targeted reading comprehension and intelligibility during paragraph level reading task. With mod I patient swallowed saliva to improve intelligbility. Pt required mod cues to use dyarthria compensations during task (slow down, pause). Pt demonstrated articulatory imprecisions (collapsing syllables), especially during multi-syllabic words (revolutionary, loyalists, continental). Facilitated awareness of breaking syllables down and saying each syllable when pronouncing more complex words. Provided article comprehension questions for homework and highlighted complex words for pt to practice saying each syllable.      Assessment / Recommendations / Plan   Plan Continue with current plan of care      Progression Toward Goals   Progression toward goals Progressing toward goals   Frustration tolerance is a limiting factor             SLP Education - 01/17/22 1419     Education Details Recommend continuing ST sessions    Person(s) Educated Patient;Spouse    Methods Explanation    Comprehension Verbalized understanding              SLP Short Term Goals - 01/10/22 1242       SLP SHORT TERM GOAL #1   Title Pt will use aphasia compensations during 5-8 minutes conversation with occasional min cues.    Time 10    Period --   sessions   Status Achieved      SLP SHORT TERM GOAL #2   Title Pt will demonstrate dysarthria HEP with rare min cues.    Time 10    Period --   sessions   Status Achieved      SLP SHORT TERM GOAL #3   Title Patient will use compensations for dysarthria in sentence responses for >95% intelligibility.    Time 10    Period --   sessions   Status Achieved      SLP SHORT TERM GOAL #4   Title Pt will complete further assessment of cognitive-communication PRN.     Time 10    Period --   sessions   Status Achieved      SLP SHORT TERM GOAL #5   Title Patient will use compensations for dysarthria in paragraph-level reading for >95% intelligibility.    Time 10    Period --   sessions   Status New      Additional Short Term Goals   Additional Short Term Goals Yes      SLP SHORT TERM GOAL #6   Title Patient will demonstrate alternating attention over 10 minutes, completing a cognitive-linguistic task with 90% accuracy.    Time 10    Period --   sessions   Status New              SLP Long Term Goals - 01/10/22 1242       SLP LONG  TERM GOAL #1   Title Pt will use aphasia compensations effectively during 20 minutes mod complex conversation with modified independence.    Time 12    Period Weeks    Status On-going      SLP LONG TERM GOAL #2   Title Pt will use aphasia compensations with modified independence to resolve communication breakdown at least 3 occasions outside of ST, per pt/spouse report.    Time 12    Period Weeks    Status On-going      SLP LONG TERM GOAL #3   Title Patient will use compensations for dysarthria in 20 minute conversation for >95% intelligibility with no more than 2 repetitions necessary.    Time 12    Period Weeks    Status On-going      SLP LONG TERM GOAL #4   Title Patient will demonstrate swallowing precautions independently x3 sessions with no overt s/sx aspiration.    Time 12    Period Weeks    Status On-going              Plan - 01/17/22 1502     Clinical Impression Statement Patient presents with moderate dysarthria, mild aphasia, and mild oropharyngeal dysphagia s/p CVA. Skilled intervention targeted dysarthria, naming/word-finding and reading comprehension. Pt continues to benefit from cues to use dysarthria compensations in reading tasks and in conversation.  Provided encouragement and discussed patient's goals and progress today. Pt agreed to continue participating in ST at this time. Pt  is making good progress toward goals, however at times struggles to see his own progress or see benefits of therapeutic intervention. I recommend continued skilled ST to improve intelligibility, verbal expression and swallowing safety necessary for increased communicatory success and independence in the home and community environments.    Speech Therapy Frequency 2x / week    Duration 12 weeks    Treatment/Interventions Aspiration precaution training;Environmental controls;Language facilitation;Cueing hierarchy;Oral motor exercises;SLP instruction and feedback;Pharyngeal strengthening exercises;Compensatory techniques;Cognitive reorganization;Functional tasks;Compensatory strategies;Diet toleration management by SLP;Internal/external aids;Multimodal communcation approach;Patient/family education    Potential to Achieve Goals Good    Consulted and Agree with Plan of Care Patient;Family member/caregiver             Patient will benefit from skilled therapeutic intervention in order to improve the following deficits and impairments:   Dysarthria and anarthria  Aphasia  Cognitive communication deficit    Problem List Patient Active Problem List   Diagnosis Date Noted   Overweight (BMI 25.0-29.9) 10/11/2021   Hypertriglyceridemia 10/11/2021   Stroke ([place]) 10/10/2021   Diabetes mellitus without complication ([place]) 16/08/9603   HTN (hypertension) 10/10/2021   Pyuria 12/25/2016   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   Medicare annual wellness visit, initial 07/17/2016   B12 deficiency 07/17/2016   History of CVA (cerebrovascular accident) without residual deficits 05/14/2016   PIN III (prostatic intraepithelial neoplasia III) 04/11/2015   Personal history of other malignant neoplasm of skin 09/16/2014   History of nonmelanoma skin cancer 09/16/2014   History of colonic polyps 08/23/2014   Diverticulosis of large intestine 08/23/2014   Benign hematuria 07/20/2014   Type 2 diabetes  mellitus ([place]) 07/04/2014   Malignant neoplasm of bladder ([place]) 07/04/2014   DM (diabetes mellitus) type II controlled with renal manifestation ([place]) 07/04/2014   Benign essential hypertension 07/04/2014   Urge incontinence 12/29/2013   Malignant neoplasm of lateral wall of urinary bladder ([place]) 09/09/2012   Incomplete emptying of bladder 09/09/2012   Enlarged prostate with lower urinary tract symptoms (LUTS)  09/09/2012   Elevated prostate specific antigen (PSA) 09/09/2012   Chronic prostatitis 09/09/2012   Carcinoma in situ of prostate 09/09/2012   Kidney stone 09/09/2012   Benign prostatic hyperplasia with urinary obstruction 09/09/2012   Mickel Fuchs [place], Student-SLP 01/17/2022, 3:13 PM  [place] MAIN [facility] SERVICES [address] [place], [place], [address] Phone: [phone]   Fax:  [phone]   Name: Casey Adkins MRN: [id number] Date of Birth: 1934-02-12

## 2022-01-21 ENCOUNTER — Observation Stay
Admission: EM | Admit: 2022-01-21 | Discharge: 2022-01-22 | Disposition: A | Discharge status: Home / Self Care | Payer: Medicare Other | Attending: Internal Medicine | Admitting: Internal Medicine

## 2022-01-21 ENCOUNTER — Emergency Department: Payer: Medicare Other

## 2022-01-21 ENCOUNTER — Other Ambulatory Visit: Payer: Self-pay

## 2022-01-21 ENCOUNTER — Ambulatory Visit: Payer: Medicare Other | Admitting: Speech Pathology

## 2022-01-21 ENCOUNTER — Observation Stay: Payer: Medicare Other

## 2022-01-21 DIAGNOSIS — I10 Essential (primary) hypertension: Secondary | ICD-10-CM | POA: Insufficient documentation

## 2022-01-21 DIAGNOSIS — Z7984 Long term (current) use of oral hypoglycemic drugs: Secondary | ICD-10-CM | POA: Diagnosis not present

## 2022-01-21 DIAGNOSIS — I639 Cerebral infarction, unspecified: Secondary | ICD-10-CM | POA: Diagnosis not present

## 2022-01-21 DIAGNOSIS — N401 Enlarged prostate with lower urinary tract symptoms: Secondary | ICD-10-CM | POA: Insufficient documentation

## 2022-01-21 DIAGNOSIS — R350 Frequency of micturition: Secondary | ICD-10-CM

## 2022-01-21 DIAGNOSIS — Z20822 Contact with and (suspected) exposure to covid-19: Secondary | ICD-10-CM | POA: Insufficient documentation

## 2022-01-21 DIAGNOSIS — Z8673 Personal history of transient ischemic attack (TIA), and cerebral infarction without residual deficits: Secondary | ICD-10-CM | POA: Insufficient documentation

## 2022-01-21 DIAGNOSIS — Z8546 Personal history of malignant neoplasm of prostate: Secondary | ICD-10-CM | POA: Insufficient documentation

## 2022-01-21 DIAGNOSIS — R42 Dizziness and giddiness: Secondary | ICD-10-CM | POA: Diagnosis present

## 2022-01-21 DIAGNOSIS — Z8551 Personal history of malignant neoplasm of bladder: Secondary | ICD-10-CM | POA: Insufficient documentation

## 2022-01-21 DIAGNOSIS — Z794 Long term (current) use of insulin: Secondary | ICD-10-CM | POA: Insufficient documentation

## 2022-01-21 DIAGNOSIS — R29810 Facial weakness: Secondary | ICD-10-CM | POA: Diagnosis not present

## 2022-01-21 DIAGNOSIS — R4781 Slurred speech: Secondary | ICD-10-CM | POA: Diagnosis not present

## 2022-01-21 DIAGNOSIS — Z79899 Other long term (current) drug therapy: Secondary | ICD-10-CM | POA: Diagnosis not present

## 2022-01-21 DIAGNOSIS — R2990 Unspecified symptoms and signs involving the nervous system: Secondary | ICD-10-CM | POA: Insufficient documentation

## 2022-01-21 DIAGNOSIS — Z87891 Personal history of nicotine dependence: Secondary | ICD-10-CM | POA: Diagnosis not present

## 2022-01-21 DIAGNOSIS — G250 Essential tremor: Secondary | ICD-10-CM | POA: Insufficient documentation

## 2022-01-21 DIAGNOSIS — Z7902 Long term (current) use of antithrombotics/antiplatelets: Secondary | ICD-10-CM | POA: Diagnosis not present

## 2022-01-21 DIAGNOSIS — Z7982 Long term (current) use of aspirin: Secondary | ICD-10-CM | POA: Insufficient documentation

## 2022-01-21 DIAGNOSIS — E785 Hyperlipidemia, unspecified: Secondary | ICD-10-CM | POA: Diagnosis present

## 2022-01-21 DIAGNOSIS — E119 Type 2 diabetes mellitus without complications: Secondary | ICD-10-CM | POA: Diagnosis not present

## 2022-01-21 DIAGNOSIS — R110 Nausea: Secondary | ICD-10-CM | POA: Diagnosis not present

## 2022-01-21 LAB — DIFFERENTIAL
Abs Immature Granulocytes: 0.04 10*3/uL (ref 0.00–0.07)
Basophils Absolute: 0.1 10*3/uL (ref 0.0–0.1)
Basophils Relative: 1 %
Eosinophils Absolute: 0.1 10*3/uL (ref 0.0–0.5)
Eosinophils Relative: 2 %
Immature Granulocytes: 0 %
Lymphocytes Relative: 25 %
Lymphs Abs: 2.2 10*3/uL (ref 0.7–4.0)
Monocytes Absolute: 0.6 10*3/uL (ref 0.1–1.0)
Monocytes Relative: 6 %
Neutro Abs: 5.9 10*3/uL (ref 1.7–7.7)
Neutrophils Relative %: 66 %

## 2022-01-21 LAB — COMPREHENSIVE METABOLIC PANEL
ALT: 14 U/L (ref 0–44)
AST: 17 U/L (ref 15–41)
Albumin: 3.9 g/dL (ref 3.5–5.0)
Alkaline Phosphatase: 66 U/L (ref 38–126)
Anion gap: 9 (ref 5–15)
BUN: 10 mg/dL (ref 8–23)
CO2: 27 mmol/L (ref 22–32)
Calcium: 9.1 mg/dL (ref 8.9–10.3)
Chloride: 101 mmol/L (ref 98–111)
Creatinine, Ser: 0.55 mg/dL — ABNORMAL LOW (ref 0.61–1.24)
GFR, Estimated: >60 mL/min (ref >60)
Glucose, Bld: 141 mg/dL — ABNORMAL HIGH (ref 70–99)
Potassium: 4.6 mmol/L (ref 3.5–5.1)
Sodium: 137 mmol/L (ref 135–145)
Total Bilirubin: 0.6 mg/dL (ref 0.3–1.2)
Total Protein: 7.9 g/dL (ref 6.5–8.1)

## 2022-01-21 LAB — CBG MONITORING, ED
Glucose-Capillary: 114 mg/dL — ABNORMAL HIGH (ref 70–99)
Glucose-Capillary: 158 mg/dL — ABNORMAL HIGH (ref 70–99)

## 2022-01-21 LAB — CBC
HCT: 40.5 % (ref 39.0–52.0)
Hemoglobin: 13.5 g/dL (ref 13.0–17.0)
MCH: 31 pg (ref 26.0–34.0)
MCHC: 33.3 g/dL (ref 30.0–36.0)
MCV: 92.9 fL (ref 80.0–100.0)
Platelets: 157 10*3/uL (ref 150–400)
RBC: 4.36 MIL/uL (ref 4.22–5.81)
RDW: 13.2 % (ref 11.5–15.5)
WBC: 9 10*3/uL (ref 4.0–10.5)
nRBC: 0 % (ref 0.0–0.2)

## 2022-01-21 LAB — APTT: aPTT: 35 seconds (ref 24–36)

## 2022-01-21 LAB — PROTIME-INR
INR: 1 (ref 0.8–1.2)
Prothrombin Time: 13.4 seconds (ref 11.4–15.2)

## 2022-01-21 LAB — RESP PANEL BY RT-PCR (FLU A&B, COVID) ARPGX2
Influenza A by PCR: NEGATIVE
Influenza B by PCR: NEGATIVE
SARS Coronavirus 2 by RT PCR: NEGATIVE

## 2022-01-21 MED ORDER — IOHEXOL 350 MG/ML SOLN
75.0000 mL | Freq: Once | INTRAVENOUS | Status: AC | PRN
  Administered 2022-01-21: 75 mL via INTRAVENOUS

## 2022-01-21 MED ORDER — STROKE: EARLY STAGES OF RECOVERY BOOK: Freq: Once | Status: DC

## 2022-01-21 MED ORDER — ACETAMINOPHEN 650 MG RE SUPP: 650.0000 mg | RECTAL | Status: DC | PRN

## 2022-01-21 MED ORDER — ASPIRIN EC 81 MG PO TBEC
81.0000 mg | DELAYED_RELEASE_TABLET | Freq: Every day | ORAL | Status: DC
  Administered 2022-01-21 – 2022-01-22 (×2): 81 mg via ORAL
  Filled 2022-01-21 (×2): qty 1

## 2022-01-21 MED ORDER — OYSTER SHELL CALCIUM/D3 500-5 MG-MCG PO TABS
1.0000 | ORAL_TABLET | Freq: Every day | ORAL | Status: DC
  Administered 2022-01-22: 1 via ORAL
  Filled 2022-01-21: qty 1

## 2022-01-21 MED ORDER — CLOPIDOGREL BISULFATE 75 MG PO TABS
75.0000 mg | ORAL_TABLET | Freq: Every day | ORAL | Status: DC
  Administered 2022-01-22: 75 mg via ORAL
  Filled 2022-01-21: qty 1

## 2022-01-21 MED ORDER — CALCIUM-D 600-400 MG-UNIT PO TABS: 1.0000 | ORAL_TABLET | Freq: Every day | ORAL | Status: DC

## 2022-01-21 MED ORDER — PRIMIDONE 50 MG PO TABS
75.0000 mg | ORAL_TABLET | Freq: Two times a day (BID) | ORAL | Status: DC
  Administered 2022-01-21 – 2022-01-22 (×2): 75 mg via ORAL
  Filled 2022-01-21 (×4): qty 1.5

## 2022-01-21 MED ORDER — SENNOSIDES-DOCUSATE SODIUM 8.6-50 MG PO TABS: 1.0000 | ORAL_TABLET | Freq: Every evening | ORAL | Status: DC | PRN

## 2022-01-21 MED ORDER — ONDANSETRON HCL 4 MG/2ML IJ SOLN
4.0000 mg | Freq: Three times a day (TID) | INTRAMUSCULAR | Status: DC | PRN
Start: 2022-01-21 — End: 2022-01-22

## 2022-01-21 MED ORDER — SODIUM CHLORIDE 0.9% FLUSH: 3.0000 mL | Freq: Once | INTRAVENOUS | Status: DC

## 2022-01-21 MED ORDER — HYDRALAZINE HCL 20 MG/ML IJ SOLN: 5.0000 mg | INTRAMUSCULAR | Status: DC | PRN

## 2022-01-21 MED ORDER — VITAMIN B-12 1000 MCG PO TABS
500.0000 ug | ORAL_TABLET | Freq: Every day | ORAL | Status: DC
  Administered 2022-01-22: 500 ug via ORAL
  Filled 2022-01-21: qty 0.5

## 2022-01-21 MED ORDER — ROSUVASTATIN CALCIUM 20 MG PO TABS
10.0000 mg | ORAL_TABLET | Freq: Every day | ORAL | Status: DC
  Administered 2022-01-22: 10 mg via ORAL
  Filled 2022-01-21: qty 1

## 2022-01-21 MED ORDER — ACETAMINOPHEN 160 MG/5ML PO SOLN
650.0000 mg | ORAL | Status: DC | PRN
  Filled 2022-01-21: qty 20.3

## 2022-01-21 MED ORDER — INSULIN ASPART 100 UNIT/ML IJ SOLN
0.0000 [IU] | Freq: Three times a day (TID) | INTRAMUSCULAR | Status: DC
  Administered 2022-01-22: 2 [IU] via SUBCUTANEOUS
  Filled 2022-01-21: qty 1

## 2022-01-21 MED ORDER — FINASTERIDE 5 MG PO TABS
5.0000 mg | ORAL_TABLET | Freq: Every day | ORAL | Status: DC
Start: 2022-01-22 — End: 2022-01-22
  Administered 2022-01-22: 5 mg via ORAL
  Filled 2022-01-21: qty 1

## 2022-01-21 MED ORDER — ENOXAPARIN SODIUM 40 MG/0.4ML IJ SOSY
40.0000 mg | PREFILLED_SYRINGE | INTRAMUSCULAR | Status: DC
  Administered 2022-01-21: 40 mg via SUBCUTANEOUS
  Filled 2022-01-21: qty 0.4

## 2022-01-21 MED ORDER — INSULIN ASPART 100 UNIT/ML IJ SOLN: 0.0000 [IU] | Freq: Every day | INTRAMUSCULAR | Status: DC

## 2022-01-21 MED ORDER — ACETAMINOPHEN 325 MG PO TABS: 650.0000 mg | ORAL_TABLET | ORAL | Status: DC | PRN

## 2022-01-21 MED ORDER — OCUVITE-LUTEIN PO CAPS
1.0000 | ORAL_CAPSULE | Freq: Every day | ORAL | Status: DC
  Administered 2022-01-21 – 2022-01-22 (×2): 1 via ORAL
  Filled 2022-01-21 (×2): qty 1

## 2022-01-21 MED ORDER — PRESERVISION AREDS PO CAPS
1.0000 | ORAL_CAPSULE | Freq: Two times a day (BID) | ORAL | Status: DC
Start: 2022-01-22 — End: 2022-01-21

## 2022-01-21 NOTE — ED Provider Notes (Signed)
[facility] Provider Note    Event Date/Time   First MD Initiated Contact with Patient 01/21/22 1519     (approximate)   History   Dizziness   HPI  ILIAN Adkins is a 88 y.o. male here with dizziness.  Over the last 12 hours, the patient has gone from being able to independently ambulate to requiring a essentially two-person assist.  He states that he feels incredibly unsteady when walking, it began fairly acutely.  He feels like he is leaning forwards, backwards, or sideways.  He has almost fallen multiple times.  Patient is also had slight increase in slurred speech, though this is also baseline from a stroke back in November.  He is unable to obtain MRI due to a implant in his left ear.  Denies recent illness.  He had some nausea, but no vomiting.  He initially reported loose stools but states this was only after he took milk of magnesia for constipation.  No abdominal pain.  No headache.  Denies known vision changes.  No tinnitus.     Physical Exam   Triage Vital Signs: ED Triage Vitals  Enc Vitals Group     BP 01/21/22 1042 (!) 155/62     Pulse Rate 01/21/22 1042 63     Resp 01/21/22 1042 18     Temp 01/21/22 1042 98 F (36.7 C)     Temp src --      SpO2 01/21/22 1042 98 %     Weight --      Height --      Head Circumference --      Peak Flow --      Pain Score 01/21/22 1040 0     Pain Loc --      Pain Edu? --      Excl. in [place]? --     Most recent vital signs: Vitals:   01/21/22 1042 01/21/22 1419  BP: (!) 155/62 (!) 141/85  Pulse: 63 71  Resp: 18 18  Temp: 98 F (36.7 C)   SpO2: 98% 97%     General: Awake, no distress.  CV:  Good peripheral perfusion.  Resp:  Normal effort.  Abd:  No distention.  Other:  Right facial droop.  Slightly disconjugate gaze.  Leftward beating nystagmus noted on leftward gaze.  Tongue protrusion midline.  Strength 5 out of 5 bilateral upper and lower extremities.  Gait deferred.  Normal sensation  light touch.  No overt dysmetria on finger-to-nose testing.   ED Results / Procedures / Treatments   Labs (all labs ordered are listed, but only abnormal results are displayed) Labs Reviewed  COMPREHENSIVE METABOLIC PANEL - Abnormal; Notable for the following components:      Result Value   Glucose, Bld 141 (*)    Creatinine, Ser 0.55 (*)    All other components within normal limits  PROTIME-INR  APTT  CBC  DIFFERENTIAL  I-STAT CREATININE, ED  CBG MONITORING, ED     EKG Sinus rhythm with first-degree AV block.  Ventricular rate 69.  PR 218, QRS 90, QTc 396.  No acute ST elevations or depressions.  No EKG evidence of acute ischemia or infarct.   RADIOLOGY CT head: No acute intracranial normality, extensive chronic small vessel disease, chronic left basal ganglia infarct   I also independently reviewed and agree wit radiologist interpretations.   PROCEDURES:  Critical Care performed: No  .1-3 Lead EKG Interpretation Performed by: Duffy Bruce, MD Authorized by:  Duffy Bruce, MD     Interpretation: normal     ECG rate:  70-80   ECG rate assessment: normal     Rhythm: sinus rhythm     Ectopy: none     Conduction: normal   Comments:     Indication: Stroke like sx    MEDICATIONS ORDERED IN ED: Medications  sodium chloride flush (NS) 0.9 % injection 3 mL (has no administration in time range)     IMPRESSION / MDM / ASSESSMENT AND PLAN / ED COURSE  I reviewed the triage vital signs and the nursing notes.                               The patient is on the cardiac monitor to evaluate for evidence of arrhythmia and/or significant heart rate changes.   Ddx:  New stroke, recrudescence of prior stroke though he states he did not have the dizziness with this, peripheral vertigo, polypharmacy, peripheral neuropathy causing balance issues   MDM:  88 year old male here with cute onset of balance issues.  Patient has a history of hypertension,  hyperlipidemia, prior stroke in November, here with new difficulty ambulating due to loss of balance.  No significant signs of inner or middle ear abnormality.  He does not have nystagmus at rest.  Patient significantly high risk for stroke given his history and he is already on aspirin and Plavix.  He is unable to obtain MRI due to implant in his left ear.  I discussed the case with Dr. Quinn Axe of neurology, will obtain CT angio and plan to admit.  Will likely need at least dual antiplatelet therapy, further medication as indicated.  CT head obtained, reviewed, shows no evidence of acute abnormality.  He does have extensive small vessel ischemic disease.  CBC shows no leukocytosis.  CMP at baseline with normal renal function.  CT angio ordered.  This was discussed with the patient and family, who are in agreement with this plan.  He is unsteady but has not had any falls or signs of trauma.   MEDICATIONS GIVEN IN ED: Medications  sodium chloride flush (NS) 0.9 % injection 3 mL (has no administration in time range)     Consults:  Case discussed with Dr. Quinn Axe, neurology Hospitalist consulted for admission   EMR reviewed  EMR reviewed, including recent admission and DC summary from November 2022 noting stroke     FINAL CLINICAL IMPRESSION(S) / ED DIAGNOSES   Final diagnoses:  Dizziness  Stroke-like episode     Rx / DC Orders   ED Discharge Orders     None        Note:  This document was prepared using Dragon voice recognition software and may include unintentional dictation errors.   Duffy Bruce, MD 01/21/22 1622

## 2022-01-21 NOTE — H&P (Signed)
History and Physical    Casey Adkins DOB: 23-Apr-1934 DOA: 01/21/2022  Referring MD/NP/PA:   PCP: Rusty Aus, MD   Patient coming from:  The patient is coming from home.  At baseline, pt is independent for most of ADL.        Chief Complaint: Dizziness, poor balance  HPI: Casey Adkins is a 88 y.o. 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  Patient had a stroke in November 2022, with some residual symptoms, including mild slurred speech, mild right facial droop and mild right sided weakness. He states he developed dizziness since yesterday, with very poor balance, almost fall several times. He also has increased slurred speech from baseline. He states that he feels incredibly unsteady when walking, it began fairly acutely. He has gone from being able to independently ambulate to requiring two-person assistance.  No significant change in facial droop, right-sided weakness from baseline.  Patient does not have chest pain, cough, shortness breath.  No fever or chills. He has nausea, constipation, no vomiting, diarrhea or abdominal pain.  His wife states the patient took magnesium for constipation, then had loose stool bowel movement.  Data Reviewed and ED Course: pt was found to have WBC 9.0, pending COVID PCR, INR 1.0, PTT 35, electrolytes renal function okay, temperature normal, blood pressure 141/85, heart rate 71, RR 18, oxygen saturation 97% on room air.  CT head negative for acute intracranial abnormalities.  Patient is placed on telemetry bed for position, Dr. Quinn Axe of neurology is consulted, he will see patient tomorrow morning.   EKG: I have personally reviewed.  Sinus rhythm, QTc 396, left axis deviation, PVC.  Review of Systems:   General: no fevers, chills, no body weight  gain, has fatigue HEENT: no blurry vision, hearing changes or sore throat Respiratory: no dyspnea, coughing, wheezing CV: no chest pain, no palpitations GI: no nausea, vomiting, abdominal pain, diarrhea, has constipation GU: no dysuria, burning on urination, increased urinary frequency, hematuria  Ext: no leg edema Neuro: Has new dizziness and unsteadiness, poor balance, has worsening slurred speech, has chronic right facial droop, mild right-sided weakness Skin: no rash, no skin tear. MSK: No muscle spasm, no deformity, no limitation of range of movement in spin Heme: No easy bruising.  Travel history: No recent long distant travel.   Allergy:  Allergies  Allergen Reactions   Sulfa Antibiotics Anaphylaxis    Tongue swelling.   Elemental Sulfur Swelling    In throat and tongue    Prilosec [Omeprazole]     GI sxs   Atorvastatin Rash    Past Medical History:  Diagnosis Date   Allergic rhinitis    Arthritis    Cancer ([place])    Bladder Cancer   Cancer ([place])    Prostate Cancer   Diabetes mellitus without complication ([place])    Diverticulosis    Dysphagia    GERD (gastroesophageal reflux disease)    History of kidney stones    h/o   Hypertension    Stroke [facility]) 2006   possible light stroke-no deficits    Past Surgical History:  Procedure Laterality Date   CYSTOSCOPY W/ RETROGRADES Bilateral 06/07/2019   Procedure: CYSTOSCOPY WITH RETROGRADE PYELOGRAM;  Surgeon: Hollice Espy, MD;  Location: [facility] ORS;  Service: Urology;  Laterality: Bilateral;   CYSTOSCOPY WITH BIOPSY N/A 06/07/2019  Procedure: CYSTOSCOPY WITH Bladder BIOPSY;  Surgeon: Hollice Espy, MD;  Location: [facility] ORS;  Service: Urology;  Laterality: N/A;   ESOPHAGOGASTRODUODENOSCOPY N/A 04/21/2015   Procedure: ESOPHAGOGASTRODUODENOSCOPY (EGD);  Surgeon: Hulen Luster, MD;  Location: [facility] ENDOSCOPY;  Service: Gastroenterology;  Laterality: N/A;   LITHOTRIPSY     SINUS SURGERY WITH INSTATRAK  2008   STAPEDES SURGERY  Left age 71   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 His smoking use included cigarettes. He has a 60.00 pack-year smoking history. He has never used smokeless tobacco. He reports that he does not drink alcohol and does not use drugs.  Family History:  Family History  Problem Relation Age of Onset   Prostate cancer Neg Hx    Bladder Cancer Neg Hx    Kidney cancer Neg Hx      Prior to Admission medications   Medication Sig Start Date End Date Taking? Authorizing Provider  acetaminophen (TYLENOL) 500 MG tablet Take 500 mg by mouth every 6 (six) hours as needed for moderate pain.    [provider]  aspirin EC 81 MG tablet Take 81 mg by mouth daily.    [provider]  atorvastatin (LIPITOR) 80 MG tablet Take 1 tablet (80 mg total) by mouth daily. 10/12/21   Annita Brod, MD  Calcium Carbonate-Vitamin D (CALCIUM-D) 600-400 MG-UNIT TABS Take 1 tablet by mouth daily.    [provider]  clopidogrel (PLAVIX) 75 MG tablet Take 1 tablet (75 mg total) by mouth daily. 10/12/21   Annita Brod, MD  finasteride (PROSCAR) 5 MG tablet TAKE 1 TABLET BY MOUTH EVERY DAY 09/06/21   Billey Co, MD  glimepiride (AMARYL) 4 MG tablet Take 1 tablet by mouth daily before breakfast. 03/24/15   [provider]  lisinopril (PRINIVIL,ZESTRIL) 10 MG tablet Take 10 mg by mouth 2 (two) times daily.    [provider]  Multiple Vitamins-Minerals (PRESERVISION AREDS PO) Take 1 tablet by mouth 2 (two) times daily.    [provider]  propranolol (INDERAL) 20 MG tablet Take 20 mg by mouth 2 (two) times daily.  01/06/15   [provider]  TRULICITY 1.5 XL/2.4MW SOPN SMARTSIG:0.5 Milliliter(s) SUB-Q Once a Week 04/12/21   [provider]  vitamin B-12 (CYANOCOBALAMIN) 500 MCG tablet Take 500 mcg by mouth daily.    [provider]    Physical Exam: Vitals:   01/21/22 1042 01/21/22 1419 01/21/22 1700  BP: (!) 155/62 (!) 141/85   Pulse: 63 71   Resp: 18 18   Temp: 98 F (36.7 C)    SpO2: 98% 97%   Weight:   86.2 kg   General: Not in acute distress HEENT:       Eyes: PERRL, EOMI, no scleral icterus.       ENT: No discharge from the ears and nose, no pharynx injection, no tonsillar enlargement.        Neck: No JVD, no bruit, no mass felt. Heme: No neck lymph node enlargement. Cardiac: S1/S2, RRR, No murmurs, No gallops or rubs. Respiratory: No rales, wheezing, rhonchi or rubs. GI: Soft, nondistended, nontender, no rebound pain, no organomegaly, BS present. GU: No hematuria Ext: No pitting leg edema bilaterally. 1+DP/PT pulse bilaterally. Musculoskeletal: No joint deformities, No joint redness or warmth, no limitation of ROM in spin. Skin: No rashes.  Neuro: Alert, oriented X3, has poor balance, cranial nerves II-XII grossly intact except for mild right facial droop and slurred speech.  Muscle strength minimally decreased in the right arm and leg. Psych: Patient is not psychotic, no suicidal or hemocidal ideation.  Labs on Admission: I have personally reviewed following labs and imaging studies  CBC: Recent Labs  Lab 01/21/22 1051  WBC 9.0  NEUTROABS 5.9  HGB 13.5  HCT 40.5  MCV 92.9  PLT 474   Basic Metabolic Panel: Recent Labs  Lab 01/21/22 1051  NA 137  K 4.6  CL 101  CO2 27  GLUCOSE 141*  BUN 10  CREATININE 0.55*  CALCIUM 9.1   GFR: Estimated Creatinine Clearance: 73.4 mL/min (A) (by C-G formula based on SCr of 0.55 mg/dL (L)). Liver Function Tests: Recent Labs  Lab 01/21/22 1051  AST 17  ALT 14  ALKPHOS 66  BILITOT 0.6  PROT 7.9  ALBUMIN 3.9   No results for input(s): LIPASE, AMYLASE in the last 168 hours. No results for input(s): AMMONIA in the last 168 hours. Coagulation Profile: Recent Labs  Lab 01/21/22 1051  INR 1.0   Cardiac Enzymes: No results  for input(s): CKTOTAL, CKMB, CKMBINDEX, TROPONINI in the last 168 hours. BNP (last 3 results) No results for input(s): PROBNP in the last 8760 hours. HbA1C: No results for input(s): HGBA1C in the last 72 hours. CBG: Recent Labs  Lab 01/21/22 1734  GLUCAP 114*   Lipid Profile: No results for input(s): CHOL, HDL, LDLCALC, TRIG, CHOLHDL, LDLDIRECT in the last 72 hours. Thyroid Function Tests: No results for input(s): TSH, T4TOTAL, FREET4, T3FREE, THYROIDAB in the last 72 hours. Anemia Panel: No results for input(s): VITAMINB12, FOLATE, FERRITIN, TIBC, IRON, RETICCTPCT in the last 72 hours. Urine analysis:    Component Value Date/Time   APPEARANCEUR Clear 03/07/2021 0859   GLUCOSEU 1+ (A) 03/07/2021 0859   BILIRUBINUR Negative 03/07/2021 0859   PROTEINUR Negative 03/07/2021 0859   NITRITE Negative 03/07/2021 0859   LEUKOCYTESUR Negative 03/07/2021 0859   Sepsis Labs: @LABRCNTIP (procalcitonin:4,lacticidven:4) )No results found for this or any previous visit (from the past 240 hour(s)).   Radiological Exams on Admission: CT ANGIO HEAD NECK W WO CM  Result Date: 01/21/2022 CLINICAL DATA:  Neuro deficit, acute, stroke suspected. Dizziness and imbalance. History of stroke. EXAM: CT ANGIOGRAPHY HEAD AND NECK TECHNIQUE: Multidetector CT imaging of the head and neck was performed using the standard protocol during bolus administration of intravenous contrast. Multiplanar CT image reconstructions and MIPs were obtained to evaluate the vascular anatomy. Carotid stenosis measurements (when applicable) are obtained utilizing NASCET criteria, using the distal internal carotid diameter as the denominator. RADIATION DOSE REDUCTION: This exam was performed according to the departmental dose-optimization program which includes automated exposure control, adjustment of the mA and/or kV according to patient size and/or use of iterative reconstruction technique. CONTRAST:  69mL OMNIPAQUE IOHEXOL 350 MG/ML  SOLN COMPARISON:  Head and neck CTA 10/10/2021 FINDINGS: CTA NECK FINDINGS Aortic arch: Standard 3 vessel aortic arch with mild atherosclerotic plaque. No significant arch vessel origin stenosis. Right carotid system: Patent with a moderate amount of calcified plaque about the carotid bifurcation. No evidence of a significant stenosis or dissection. Left carotid system: Patent with scattered, mild plaque in the common and proximal internal carotid arteries. No evidence of significant stenosis or dissection. Vertebral arteries: Patent with severe stenoses of the right greater than left vertebral origins, similar to the prior CTA. Skeleton: Moderate cervical spondylosis. Other neck: No evidence  of cervical lymphadenopathy or mass. Upper chest: No apical lung consolidation or mass. Review of the MIP images confirms the above findings CTA HEAD FINDINGS Anterior circulation: The internal carotid arteries are patent from skull base to carotid termini with calcified plaque resulting in mild bilateral cavernous stenosis, similar to the prior CTA. ACAs and MCAs are patent without evidence of a proximal branch occlusion or significant proximal stenosis. No aneurysm is identified. Posterior circulation: The intracranial vertebral arteries are patent with mild atherosclerotic irregularity but no significant stenosis. Patent AICA and SCA origins are seen bilaterally. The basilar artery is widely patent. There are likely small posterior communicating arteries bilaterally. Both PCAs are patent without evidence of a significant proximal stenosis. No aneurysm is identified. Venous sinuses: As permitted by contrast timing, patent. Anatomic variants: None. Review of the MIP images confirms the above findings IMPRESSION: 1. No large vessel occlusion. 2. Unchanged severe bilateral vertebral artery origin stenoses. 3. Mild bilateral intracranial ICA stenosis. 4. Cervical carotid artery atherosclerosis without significant stenosis. 5.  Aortic Atherosclerosis (ICD10-I70.0). Electronically Signed   By: Logan Bores M.D.   On: 01/21/2022 17:32   CT HEAD WO CONTRAST  Result Date: 01/21/2022 CLINICAL DATA:  Dizziness and imbalance. History of stroke with chronic right-sided weakness and speech disturbance. EXAM: CT HEAD WITHOUT CONTRAST TECHNIQUE: Contiguous axial images were obtained from the base of the skull through the vertex without intravenous contrast. RADIATION DOSE REDUCTION: This exam was performed according to the departmental dose-optimization program which includes automated exposure control, adjustment of the mA and/or kV according to patient size and/or use of iterative reconstruction technique. COMPARISON:  Head CT 10/11/2021 FINDINGS: Brain: There is no evidence of an acute infarct, intracranial hemorrhage, mass, midline shift, or extra-axial fluid collection. Encephalomalacia has developed in the left corona radiata and basal ganglia since the prior study. Patchy and confluent hypodensities elsewhere in the bilateral cerebral white matter and deep gray nuclei are unchanged and nonspecific but compatible with extensive chronic small vessel ischemic disease. Mild cerebral atrophy is within normal limits for age. Vascular: Calcified atherosclerosis at the skull base. No hyperdense vessel. Skull: No fracture or suspicious osseous lesion. Sinuses/Orbits: The visualized paranasal sinuses and mastoid air cells are clear. Unremarkable orbits. Other: None. IMPRESSION: 1. No evidence of acute intracranial abnormality. 2. Extensive chronic small vessel ischemic disease. Chronic left basal ganglia infarct. Electronically Signed   By: Logan Bores M.D.   On: 01/21/2022 11:23      Assessment/Plan Principal Problem:   Stroke-like symptom Active Problems:   History of CVA (cerebrovascular accident) without residual deficits   Enlarged prostate with lower urinary tract symptoms (LUTS)   Diabetes mellitus without complication (HCC)   HTN  (hypertension)   HLD (hyperlipidemia)   Essential tremor   Stroke-like symptom: Patient's new symptoms are very concerning for new stroke.  CT head negative.  Patient has an ear implant, cannot do MRI for brain.  Dr. Quinn Axe of neurology is consulted, recommended CT angiogram.  - Placed on tele bed for observation - CT angio of head and neck  - will hold oral Bp meds to allow permissive HTN in the setting of acute stroke - Plavix 75 mg daily and ASA 81 mg daily  - Crestor - fasting lipid panel and HbA1c  - 2D transthoracic echocardiography  - swallowing screen. If fails, will get SLP - PT/OT consult   Benign prostatic hyperplasia with urinary obstruction -Proscar   Diabetes mellitus without complication [facility]): Patient is taking Trulicity and Amaryl  at home.  Recent A1c 8.5, poorly controlled -SSI   HTN (hypertension) -As needed hydralazine for SBP>220 or dBP>120 -hold home lisinopril, [place] and propranolol  Essential tremor: -Primidone      DVT ppx:  SQ Lovenox  Code Status: Full code  Family Communication:  Yes, patient's wife and daughter    at bed side.  Disposition Plan:  Anticipate discharge back to previous environment  Consults called:  Dr. Quinn Axe of neuro  Admission status and Level of care: Telemetry Medical:   for obs    Severity of Illness:  The appropriate patient status for this patient is OBSERVATION. Observation status is judged to be reasonable and necessary in order to provide the required intensity of service to ensure the patient's safety. The patient's presenting symptoms, physical exam findings, and initial radiographic and laboratory data in the context of their medical condition is felt to place them at decreased risk for further clinical deterioration. Furthermore, it is anticipated that the patient will be medically stable for discharge from the hospital within 2 midnights of admission.        Date of Service 01/21/2022    [place] [facility]   If 7PM-7AM, please contact night-coverage [email or web address] 01/21/2022, 5:41 PM

## 2022-01-21 NOTE — ED Notes (Signed)
Pt complains of dizziness and unsteady gait

## 2022-01-21 NOTE — ED Triage Notes (Addendum)
Pt comes with c/o dizziness and off balance. Pt states this started yesterday. Pt does have hx of stroke Nov 2022. Pt does have speech, right side weakness and some balance deficits from that stroke. Wife reports the balance is more than normal. Pt is on blood thinners.   Pt denies any CP, SOB or pain. Pt states he does have some nausea.  Mini neuro neg. Pt denies any blurry vision.

## 2022-01-21 NOTE — Progress Notes (Signed)
Family updated regarding bed status and pending inpatient admission process. No further questions or concerns posed to this RN.

## 2022-01-22 ENCOUNTER — Observation Stay
Admit: 2022-01-22 | Discharge: 2022-01-22 | Disposition: A | Discharge status: Home / Self Care | Payer: Medicare Other | Attending: Internal Medicine | Admitting: Internal Medicine

## 2022-01-22 DIAGNOSIS — R2990 Unspecified symptoms and signs involving the nervous system: Secondary | ICD-10-CM | POA: Diagnosis not present

## 2022-01-22 LAB — LIPID PANEL
Cholesterol: 116 mg/dL (ref 0–200)
HDL: 37 mg/dL — ABNORMAL LOW (ref >40)
LDL Cholesterol: 31 mg/dL (ref 0–99)
Total CHOL/HDL Ratio: 3.1 RATIO
Triglycerides: 239 mg/dL — ABNORMAL HIGH (ref <150)
VLDL: 48 mg/dL — ABNORMAL HIGH (ref 0–40)

## 2022-01-22 LAB — CBG MONITORING, ED
Glucose-Capillary: 111 mg/dL — ABNORMAL HIGH (ref 70–99)
Glucose-Capillary: 198 mg/dL — ABNORMAL HIGH (ref 70–99)

## 2022-01-22 LAB — ECHOCARDIOGRAM COMPLETE
S' Lateral: 2.8 cm
Weight: 3040 oz

## 2022-01-22 LAB — HEMOGLOBIN A1C
Hgb A1c MFr Bld: 7.4 % — ABNORMAL HIGH (ref 4.8–5.6)
Mean Plasma Glucose: 166 mg/dL

## 2022-01-22 MED ORDER — ASPIRIN EC 81 MG PO TBEC: 81.0000 mg | DELAYED_RELEASE_TABLET | Freq: Every day | ORAL | 0 refills | Status: AC

## 2022-01-22 NOTE — ED Notes (Signed)
Pt and pt wife at bedside with eyes closed, equal unlabored RR with no questions or concerns for this RN. Pt stable and in NAD.

## 2022-01-22 NOTE — Progress Notes (Signed)
*  PRELIMINARY RESULTS* Echocardiogram 2D Echocardiogram has been performed.  Casey Adkins 01/22/2022, 9:24 AM

## 2022-01-22 NOTE — ED Notes (Signed)
Patient repositioned in bed at this time.

## 2022-01-22 NOTE — Evaluation (Signed)
Physical Therapy Evaluation Patient Details Name: Casey Adkins MRN: [id number] DOB: 06/07/1934 Today's Date: 01/22/2022  History of Present Illness  88 y.o. 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 CT head negative. Patient has an ear implant, cannot do MRI for brain.  Clinical Impression  Patient resting in room with family at bedside; alert and oriented to basic information, follows commands, agreeable and cooperative.  Patient extremely HOH (hearing aides not present). Mild dysarthria/word-finding difficulty at times (unchanged with this admission per family).  Noted mild coordination deficit to R UE/LE (again, unchanged with this admission); otherwise, strength and ROM grossly symmetrical and WFL for basic transfers and mobility.   Able to complete bed mobility with mod indep; sit/stand, basic transfers and gait (100') with RW, cga/min assist.  Demonstrates reciprocal stepping pattern; inconsistent foot placement, clearance of R LE; mild R lateral lean at times.  Decreased stability with dynamic gait components, min assist to maintain balance with head turns, changes of direction.  Do recommend continued use of RW; patient/family voiced understanding and agreement Would benefit from skilled PT to address above deficits and promote optimal return to PLOF.; recommend transition to home with outpatient PT follow up as appropriate.     Recommendations for follow up therapy are one component of a multi-disciplinary discharge planning process, led by the attending physician.  Recommendations may be updated based on patient status, additional functional criteria and insurance authorization.  Follow Up Recommendations Outpatient PT (per patient/family request)    Assistance Recommended at Discharge    Patient  can return home with the following  A little help with walking and/or transfers;A little help with bathing/dressing/bathroom    Equipment Recommendations Rolling walker (2 wheels)  Recommendations for Other Services       Functional Status Assessment Patient has had a recent decline in their functional status and demonstrates the ability to make significant improvements in function in a reasonable and predictable amount of time.     Precautions / Restrictions Precautions Precautions: Fall Restrictions Weight Bearing Restrictions: No      Mobility  Bed Mobility Overal bed mobility: Needs Assistance Bed Mobility: Supine to Sit, Sit to Supine     Supine to sit: Supervision Sit to supine: Modified independent (Device/Increase time)   General bed mobility comments: increased time/effort for transitino towards L, but able to complete without physical assist from therapist    Transfers Overall transfer level: Needs assistance Equipment used: Rolling walker (2 wheels) Transfers: Sit to/from Stand Sit to Stand: Min guard           General transfer comment: cuing for hand placement    Ambulation/Gait Ambulation/Gait assistance: Min guard Gait Distance (Feet): 100 Feet Assistive device: Rolling walker (2 wheels)         General Gait Details: reciprocal stepping pattern; inconsistent foot placement, clearance of R LE; mild R lateral lean at times.  Decreased stability with dynamic gait components, min assist to maintain balance with head turns, changes of direction.  Do recommend continued use of RW; patient/family voiced understanding and agreement  Stairs            Wheelchair Mobility    Modified Rankin (Stroke Patients Only)       Balance Overall balance assessment: Needs assistance Sitting-balance support: No upper extremity supported, Feet supported Sitting balance-Leahy Scale: Good  Standing balance support: Bilateral upper extremity  supported Standing balance-Leahy Scale: Fair Standing balance comment: requires UE support for balance                             Pertinent Vitals/Pain Pain Assessment Pain Assessment: No/denies pain    Home Living Family/patient expects to be discharged to:: Private residence Living Arrangements: Spouse/significant other Available Help at Discharge: Family;Available PRN/intermittently Type of Home: House Home Access: Stairs to enter Entrance Stairs-Rails: Right;Left Entrance Stairs-Number of Steps: 4   Home Layout: One level;Other (Comment) Home Equipment: Grab bars - tub/shower;Shower seat;Cane - single point      Prior Function Prior Level of Function : Independent/Modified Independent             Mobility Comments: Pt reports being independent for household mobilty. Uses cane over uneven surfaces/community.  Denies fall history ADLs Comments: Pt reports being independent in self care tasks at home.     Hand Dominance   Dominant Hand: Right    Extremity/Trunk Assessment   Upper Extremity Assessment Upper Extremity Assessment:  (mild coordination deficits R UE; otherwise, grossly WFL)    Lower Extremity Assessment Lower Extremity Assessment:  (mild coordination deficits R LE, otherwise grossly WFL)       Communication   Communication: HOH  Cognition Arousal/Alertness: Awake/alert Behavior During Therapy: WFL for tasks assessed/performed Overall Cognitive Status: Within Functional Limits for tasks assessed                                          General Comments      Exercises Other Exercises Other Exercises: Reviewed role of PT and progressive mobility, safety with transfers and gait (minimize distraction, slow and intentional movements), use of RW for all mobility, car transfer technique; patient voiced understanding of all information.   Assessment/Plan    PT Assessment Patient needs continued PT services  PT Problem  List Decreased strength;Decreased balance;Decreased activity tolerance;Decreased mobility;Decreased coordination;Decreased knowledge of use of DME;Decreased safety awareness;Decreased knowledge of precautions       PT Treatment Interventions DME instruction;Gait training;Stair training;Functional mobility training;Therapeutic activities;Therapeutic exercise;Balance training;Neuromuscular re-education;Cognitive remediation;Patient/family education    PT Goals (Current goals can be found in the Care Plan section)  Acute Rehab PT Goals Patient Stated Goal: to go home! PT Goal Formulation: With patient/family Time For Goal Achievement: 02/05/22 Potential to Achieve Goals: Good    Frequency 7X/week     Co-evaluation               AM-PAC PT "6 Clicks" Mobility  Outcome Measure Help needed turning from your back to your side while in a flat bed without using bedrails?: None Help needed moving from lying on your back to sitting on the side of a flat bed without using bedrails?: None Help needed moving to and from a bed to a chair (including a wheelchair)?: A Little Help needed standing up from a chair using your arms (e.g., wheelchair or bedside chair)?: A Little Help needed to walk in hospital room?: A Little Help needed climbing 3-5 steps with a railing? : A Little 6 Click Score: 20    End of Session Equipment Utilized During Treatment: Gait belt Activity Tolerance: Patient tolerated treatment well Patient left: in bed;with family/visitor present Nurse Communication: Mobility status PT Visit Diagnosis: Other symptoms and signs involving the nervous  system (R29.898);Difficulty in walking, not elsewhere classified (R26.2)    Time: 2179-8102 PT Time Calculation (min) (ACUTE ONLY): 22 min   Charges:   PT Evaluation $PT Eval Moderate Complexity: 1 Mod PT Treatments $Therapeutic Activity: 8-22 mins       Tkeyah Burkman H. Owens Shark, PT, DPT, NCS 01/22/22, 5:20 PM [phone]

## 2022-01-22 NOTE — TOC Initial Note (Signed)
Transition of Care [facility]) - Initial/Assessment Note    Patient Details  Name: Casey Adkins MRN: [id number] Date of Birth: 1934/07/05  [facility] CM/SW Contact:    Shelbie Hutching, RN Phone Number: 01/22/2022, 1:17 PM  Clinical Narrative:                 Patient under observation for stroke like symptoms.  RNCM met with patient and family at the bedside and reviewed MOON.  Patient is from home with wife, independent and drives.  Has a cane but hardly ever needs to use it.  Patient is currently getting OP speech at [facility] Outpatient Therapy department.  They do not want home health but will agree to adding PT and OT onto his outpatient therapy.    No needs identified at this time.   Expected Discharge Plan: OP Rehab Barriers to Discharge: Continued Medical Work up   Patient Goals and CMS Choice Patient states their goals for this hospitalization and ongoing recovery are:: wants to go home      Expected Discharge Plan and Services Expected Discharge Plan: OP Rehab   Discharge Planning Services: CM Consult   Living arrangements for the past 2 months: Single Family Home                 DME Arranged: N/A DME Agency: NA       HH Arranged: Patient Refused [place] Agency: NA        Prior Living Arrangements/Services Living arrangements for the past 2 months: Single Family Home Lives with:: Spouse Patient language and need for interpreter reviewed:: Yes Do you feel safe going back to the place where you live?: Yes      Need for Family Participation in Patient Care: Yes (Comment) Care giver support system in place?: Yes (comment) Current home services: DME (cane) Criminal Activity/Legal Involvement Pertinent to Current Situation/Hospitalization: No - Comment as needed  Activities of Daily Living      Permission Sought/Granted Permission sought to share information with : Case Manager, Family Supports Permission granted to share information with : Yes, Verbal  Permission Granted  Share Information with NAME: Lauri Till  Permission granted to share info w AGENCY: [facility] outpatient therapy  Permission granted to share info w Relationship: spouse  Permission granted to share info w Contact Information: [phone]  Emotional Assessment Appearance:: Appears stated age Attitude/Demeanor/Rapport: Engaged Affect (typically observed): Accepting Orientation: : Oriented to Self, Oriented to Place, Oriented to  Time, Oriented to Situation Alcohol / Substance Use: Not Applicable Psych Involvement: No (comment)  Admission diagnosis:  Stroke [facility]) [I63.9] Patient Active Problem List   Diagnosis Date Noted   HLD (hyperlipidemia) 01/21/2022   Essential tremor 01/21/2022   Stroke-like episode    Overweight (BMI 25.0-29.9) 10/11/2021   Hypertriglyceridemia 10/11/2021   Stroke-like symptom 10/10/2021   Diabetes mellitus without complication ([place]) 33/83/2919   HTN (hypertension) 10/10/2021   Pyuria 12/25/2016   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   Medicare annual wellness visit, initial 07/17/2016   B12 deficiency 07/17/2016   History of CVA (cerebrovascular accident) without residual deficits 05/14/2016   PIN III (prostatic intraepithelial neoplasia III) 04/11/2015   Personal history of other malignant neoplasm of skin 09/16/2014   History of nonmelanoma skin cancer 09/16/2014   History of colonic polyps 08/23/2014   Diverticulosis of large intestine 08/23/2014   Benign hematuria 07/20/2014   Type 2 diabetes mellitus ([place]) 07/04/2014   Malignant neoplasm of bladder ([place]) 07/04/2014   DM (diabetes  mellitus) type II controlled with renal manifestation ([place]) 07/04/2014   Benign essential hypertension 07/04/2014   Urge incontinence 12/29/2013   Malignant neoplasm of lateral wall of urinary bladder ([place]) 09/09/2012   Incomplete emptying of bladder 09/09/2012   Enlarged prostate with lower urinary tract symptoms (LUTS) 09/09/2012   Elevated prostate  specific antigen (PSA) 09/09/2012   Chronic prostatitis 09/09/2012   Carcinoma in situ of prostate 09/09/2012   Kidney stone 09/09/2012   Benign prostatic hyperplasia with urinary obstruction 09/09/2012   PCP:  Rusty Aus, MD Pharmacy:   [id number] [id number]: [phone]: [phone]    Social Determinants of Health (SDOH) Interventions    Readmission Risk Interventions No flowsheet data found.

## 2022-01-22 NOTE — Discharge Summary (Signed)
Physician Discharge Summary  Casey Adkins:[id number] DOB: [birth year] DOA: 01/21/2022  PCP: Rusty Aus, MD  Admit date: 01/21/2022 Discharge date: 01/22/2022  Admitted From: Home Disposition:  Home  Discharge Condition:Stable CODE STATUS:FULL Diet recommendation: Heart Healthy   Brief/Interim Summary:   Casey Adkins is a 88 y.o. 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  Patient had a stroke in November 2022, with some residual symptoms, including mild slurred speech, mild right facial droop and mild right sided weakness. He stated that he developed dizziness  with very poor balance, almost fall several times. He also had increased slurred speech from baseline.  Patient was admitted for the suspicion for stroke.  CT head was negative for acute intracranial abnormalities.  MRI could not be done due to presence of ear implant.  CT angiogram of head and neck did not show any large vessel occlusion, unchanged severe bilateral vertebral artery origin stenosis.  Stroke work-up completed.  Patient has returned to his baseline, he declines any dizziness and he is balance is at baseline.  Patient was seen by PT/OT and recommended home health but he declined so we recommend to follow-up as an outpatient.  Neurology cleared him for discharge today.  Following problems were addressed during his hospitalization:  Stroke like symptoms :  Presented with developed dizziness  with very poor balance, almost fall several times. He also had increased slurred speech from baseline.  Patient was admitted for the suspicion for stroke.  CT head was negative for acute intracranial abnormalities.  MRI could not be done due to presence of ear implant.  CT angiogram of head and neck did not show any large vessel occlusion,  unchanged severe bilateral vertebral artery origin stenosis.  Echo showed EF of 60 to 65%, no atrial level shunt.  Stroke work-up completed.  Patient has returned to his baseline, he declines any dizziness and he is balance is at baseline.  Patient was seen by PT/OT and recommended home health but he declined so we recommend to follow-up as an outpatient.  Neurology cleared him for discharge today.Started on dual antiplatelet therapy for 3 weeks followed by plavix alone. Continue Crestor that he was taking at home.  Last hemoglobin A1c was in the range of 8.  LDL of 31. Patient had a stroke in November 2022, with some residual symptoms, including mild slurred speech, mild right facial droop and mild right sided weakness.  Benign prostatic hyperplasia with urinary obstruction -Proscar   Diabetes mellitus without complication [facility]): Patient is taking Trulicity and Amaryl at home.  Recent A1c 8.5, poorly controlled He needs to follow-up closely with his primary care physician for the management of his diabetes.   HTN (hypertension) -Continue home lisinopril, Cozarr and propranolol   Essential tremor: -On primidone     Discharge Diagnoses:  Principal Problem:   Stroke-like symptom Active Problems:   History of CVA (cerebrovascular accident) without residual deficits   Enlarged prostate with lower urinary tract symptoms (LUTS)   Diabetes mellitus without complication (HCC)   HTN (hypertension)   HLD (hyperlipidemia)   Essential tremor    Discharge Instructions  Discharge Instructions     Diet - low sodium heart healthy   Complete by: As directed    Discharge instructions   Complete by: As directed    1)Please take your medications as instructed  2)Follow up with your PCP in a week   Increase activity slowly   Complete by: As directed       Allergies as of 01/22/2022       Reactions   Sulfa Antibiotics Anaphylaxis   Tongue swelling.   Elemental Sulfur Swelling   In throat  and tongue    Prilosec [omeprazole]    GI sxs   Atorvastatin Rash        Medication List     STOP taking these medications    atorvastatin 80 MG tablet Commonly known as: LIPITOR   lisinopril 10 MG tablet Commonly known as: ZESTRIL       TAKE these medications    acetaminophen 500 MG tablet Commonly known as: TYLENOL Take 500 mg by mouth every 6 (six) hours as needed for moderate pain.   amLODipine 5 MG tablet Commonly known as: NORVASC Take 5 mg by mouth daily.   aspirin EC 81 MG tablet Take 1 tablet (81 mg total) by mouth daily for 21 days. Swallow whole. What changed: additional instructions   Calcium-D 600-400 MG-UNIT Tabs Take 1 tablet by mouth daily.   clopidogrel 75 MG tablet Commonly known as: PLAVIX Take 1 tablet (75 mg total) by mouth daily.   finasteride 5 MG tablet Commonly known as: PROSCAR TAKE 1 TABLET BY MOUTH EVERY DAY   glimepiride 4 MG tablet Commonly known as: AMARYL Take 1 tablet by mouth daily before breakfast.   losartan 50 MG tablet Commonly known as: COZAAR Take 50 mg by mouth daily.   PRESERVISION AREDS PO Take 1 tablet by mouth 2 (two) times daily.   primidone 50 MG tablet Commonly known as: MYSOLINE Take 25-50 mg by mouth 2 (two) times daily.   propranolol 20 MG tablet Commonly known as: INDERAL Take 20 mg by mouth 2 (two) times daily.   rosuvastatin 10 MG tablet Commonly known as: CRESTOR Take 10 mg by mouth daily.   Trulicity 1.5 ER/7.4YC Sopn Generic drug: Dulaglutide SMARTSIG:0.5 Milliliter(s) SUB-Q Once a Week   vitamin B-12 500 MCG tablet Commonly known as: CYANOCOBALAMIN Take 500 mcg by mouth daily.        Follow-up Information     Rusty Aus, MD. Schedule an appointment as soon as possible for a visit in 1 week(s).   Specialty: Internal Medicine Contact information: [place] [address] [phone]                Allergies  Allergen Reactions   Sulfa  Antibiotics Anaphylaxis    Tongue swelling.   Elemental Sulfur Swelling    In throat and tongue    Prilosec [Omeprazole]     GI sxs   Atorvastatin Rash    Consultations: Neurology   Procedures/Studies: CT ANGIO HEAD NECK W WO CM  Result Date: 01/21/2022 CLINICAL DATA:  Neuro deficit, acute, stroke suspected. Dizziness and imbalance. History of stroke. EXAM: CT ANGIOGRAPHY HEAD AND NECK TECHNIQUE: Multidetector CT imaging of the head and neck was performed using the standard protocol during bolus administration of intravenous contrast. Multiplanar CT image reconstructions and MIPs were obtained to evaluate the vascular anatomy. Carotid stenosis measurements (when applicable) are obtained utilizing NASCET criteria, using the distal internal carotid diameter as the denominator. RADIATION DOSE REDUCTION: This exam was performed according to the departmental dose-optimization program which includes automated exposure control, adjustment of the mA and/or kV according to patient size and/or use of iterative reconstruction technique. CONTRAST:  40mL OMNIPAQUE IOHEXOL 350  MG/ML SOLN COMPARISON:  Head and neck CTA 10/10/2021 FINDINGS: CTA NECK FINDINGS Aortic arch: Standard 3 vessel aortic arch with mild atherosclerotic plaque. No significant arch vessel origin stenosis. Right carotid system: Patent with a moderate amount of calcified plaque about the carotid bifurcation. No evidence of a significant stenosis or dissection. Left carotid system: Patent with scattered, mild plaque in the common and proximal internal carotid arteries. No evidence of significant stenosis or dissection. Vertebral arteries: Patent with severe stenoses of the right greater than left vertebral origins, similar to the prior CTA. Skeleton: Moderate cervical spondylosis. Other neck: No evidence of cervical lymphadenopathy or mass. Upper chest: No apical lung consolidation or mass. Review of the MIP images confirms the above findings CTA  HEAD FINDINGS Anterior circulation: The internal carotid arteries are patent from skull base to carotid termini with calcified plaque resulting in mild bilateral cavernous stenosis, similar to the prior CTA. ACAs and MCAs are patent without evidence of a proximal branch occlusion or significant proximal stenosis. No aneurysm is identified. Posterior circulation: The intracranial vertebral arteries are patent with mild atherosclerotic irregularity but no significant stenosis. Patent AICA and SCA origins are seen bilaterally. The basilar artery is widely patent. There are likely small posterior communicating arteries bilaterally. Both PCAs are patent without evidence of a significant proximal stenosis. No aneurysm is identified. Venous sinuses: As permitted by contrast timing, patent. Anatomic variants: None. Review of the MIP images confirms the above findings IMPRESSION: 1. No large vessel occlusion. 2. Unchanged severe bilateral vertebral artery origin stenoses. 3. Mild bilateral intracranial ICA stenosis. 4. Cervical carotid artery atherosclerosis without significant stenosis. 5. Aortic Atherosclerosis (ICD10-I70.0). Electronically Signed   By: Logan Bores M.D.   On: 01/21/2022 17:32   CT HEAD WO CONTRAST  Result Date: 01/21/2022 CLINICAL DATA:  Dizziness and imbalance. History of stroke with chronic right-sided weakness and speech disturbance. EXAM: CT HEAD WITHOUT CONTRAST TECHNIQUE: Contiguous axial images were obtained from the base of the skull through the vertex without intravenous contrast. RADIATION DOSE REDUCTION: This exam was performed according to the departmental dose-optimization program which includes automated exposure control, adjustment of the mA and/or kV according to patient size and/or use of iterative reconstruction technique. COMPARISON:  Head CT 10/11/2021 FINDINGS: Brain: There is no evidence of an acute infarct, intracranial hemorrhage, mass, midline shift, or extra-axial fluid  collection. Encephalomalacia has developed in the left corona radiata and basal ganglia since the prior study. Patchy and confluent hypodensities elsewhere in the bilateral cerebral white matter and deep gray nuclei are unchanged and nonspecific but compatible with extensive chronic small vessel ischemic disease. Mild cerebral atrophy is within normal limits for age. Vascular: Calcified atherosclerosis at the skull base. No hyperdense vessel. Skull: No fracture or suspicious osseous lesion. Sinuses/Orbits: The visualized paranasal sinuses and mastoid air cells are clear. Unremarkable orbits. Other: None. IMPRESSION: 1. No evidence of acute intracranial abnormality. 2. Extensive chronic small vessel ischemic disease. Chronic left basal ganglia infarct. Electronically Signed   By: Logan Bores M.D.   On: 01/21/2022 11:23   ECHOCARDIOGRAM COMPLETE  Result Date: 01/22/2022    ECHOCARDIOGRAM REPORT   Patient Name:   Casey Adkins Date of Exam: 01/22/2022 Medical Rec #:  [id number]       Height:       69.0 in Accession #:    [id number]      Weight:       190.0 lb Date of Birth:  1934/11/11       BSA:  2.021 m Patient Age:    50 years        BP:           104/74 mmHg Patient Gender: M               HR:           76 bpm. Exam Location:  [facility] Procedure: 2D Echo, Cardiac Doppler and Color Doppler Indications:     Stroke I63.9  History:         Patient has prior history of Echocardiogram examinations, most                  recent 10/11/2021. Stroke; Risk Factors:Diabetes and                  Hypertension.  Sonographer:     Sherrie Sport Referring Phys:  [id number] Soledad Gerlach NIU Diagnosing Phys: Isaias Cowman MD  Sonographer Comments: No apical window and suboptimal subcostal window. IMPRESSIONS  1. Left ventricular ejection fraction, by estimation, is 60 to 65%. The left ventricle has normal function. The left ventricle has no regional wall motion abnormalities. There is mild left ventricular hypertrophy. Left ventricular  diastolic parameters are indeterminate.  2. Right ventricular systolic function is normal. The right ventricular size is normal.  3. The mitral valve is normal in structure. Mild mitral valve regurgitation. No evidence of mitral stenosis.  4. The aortic valve is normal in structure. Aortic valve regurgitation is not visualized. Aortic valve sclerosis is present, with no evidence of aortic valve stenosis.  5. The inferior vena cava is normal in size with greater than 50% respiratory variability, suggesting right atrial pressure of 3 mmHg. FINDINGS  Left Ventricle: Left ventricular ejection fraction, by estimation, is 60 to 65%. The left ventricle has normal function. The left ventricle has no regional wall motion abnormalities. The left ventricular internal cavity size was normal in size. There is  mild left ventricular hypertrophy. Left ventricular diastolic parameters are indeterminate. Right Ventricle: The right ventricular size is normal. No increase in right ventricular wall thickness. Right ventricular systolic function is normal. Left Atrium: Left atrial size was normal in size. Right Atrium: Right atrial size was normal in size. Pericardium: There is no evidence of pericardial effusion. Mitral Valve: The mitral valve is normal in structure. Mild mitral valve regurgitation. No evidence of mitral valve stenosis. Tricuspid Valve: The tricuspid valve is normal in structure. Tricuspid valve regurgitation is mild . No evidence of tricuspid stenosis. Aortic Valve: The aortic valve is normal in structure. Aortic valve regurgitation is not visualized. Aortic valve sclerosis is present, with no evidence of aortic valve stenosis. Pulmonic Valve: The pulmonic valve was normal in structure. Pulmonic valve regurgitation is not visualized. No evidence of pulmonic stenosis. Aorta: The aortic root is normal in size and structure. Venous: The inferior vena cava is normal in size with greater than 50% respiratory variability,  suggesting right atrial pressure of 3 mmHg. IAS/Shunts: No atrial level shunt detected by color flow Doppler.  LEFT VENTRICLE PLAX 2D LVIDd:         4.10 cm LVIDs:         2.80 cm LV PW:         1.20 cm LV IVS:        1.20 cm LVOT diam:     2.10 cm LVOT Area:     3.46 cm  LEFT ATRIUM         Index LA diam:  3.60 cm 1.78 cm/m                        PULMONIC VALVE AORTA                 PV Vmax:        0.59 m/s Ao Root diam: 3.50 cm PV Vmean:       34.600 cm/s                       PV VTI:         0.076 m                       PV Peak grad:   1.4 mmHg                       PV Mean grad:   1.0 mmHg                       RVOT Peak grad: 2 mmHg  TRICUSPID VALVE TR Peak grad:   15.5 mmHg TR Vmax:        197.00 cm/s  SHUNTS Systemic Diam: 2.10 cm Pulmonic VTI:  0.091 m Isaias Cowman MD Electronically signed by Isaias Cowman MD Signature Date/Time: 01/22/2022/1:28:25 PM    Final       Subjective: Patient seen and examined at the bedside this morning.  Hemodynamically stable.  Denies any dizziness during my evaluation.  No focal logical deficits.  Medically stable for discharge.  Extensive discussion done with the family at bedside  Discharge Exam: Vitals:   01/22/22 1200 01/22/22 1300  BP: 120/78 116/74  Pulse: 95 83  Resp: 14 19  Temp:  98.1 F (36.7 C)  SpO2: 98% 99%   Vitals:   01/22/22 0855 01/22/22 1100 01/22/22 1200 01/22/22 1300  BP: 104/74 110/78 120/78 116/74  Pulse: 76 81 95 83  Resp: 20 19 14 19   Temp:    98.1 F (36.7 C)  TempSrc:    Oral  SpO2: 94% 95% 98% 99%  Weight:        General: Pt is alert, awake, not in acute distress Cardiovascular: RRR, S1/S2 +, no rubs, no gallops Respiratory: CTA bilaterally, no wheezing, no rhonchi Abdominal: Soft, NT, ND, bowel sounds + Extremities: no edema, no cyanosis    The results of significant diagnostics from this hospitalization (including imaging, microbiology, ancillary and laboratory) are listed below for reference.      Microbiology: Recent Results (from the past 240 hour(s))  Resp Panel by RT-PCR (Flu A&B, Covid) Nasopharyngeal Swab     Status: None   Collection Time: 01/21/22  5:44 PM   Specimen: Nasopharyngeal Swab; Nasopharyngeal(NP) swabs in vial transport medium  Result Value Ref Range Status   SARS Coronavirus 2 by RT PCR NEGATIVE NEGATIVE Final    Comment: (NOTE) SARS-CoV-2 target nucleic acids are NOT DETECTED.  The SARS-CoV-2 RNA is generally detectable in upper respiratory specimens during the acute phase of infection. The lowest concentration of SARS-CoV-2 viral copies this assay can detect is 138 copies/mL. A negative result does not preclude SARS-Cov-2 infection and should not be used as the sole basis for treatment or other patient management decisions. A negative result may occur with  improper specimen collection/handling, submission of specimen other than nasopharyngeal swab, presence of viral mutation(s) within the areas targeted by this assay, and inadequate number  of viral copies(<138 copies/mL). A negative result must be combined with clinical observations, patient history, and epidemiological information. The expected result is Negative.  Fact Sheet for Patients:  EntrepreneurPulse.com.au  Fact Sheet for Healthcare Providers:  IncredibleEmployment.be  This test is no t yet approved or cleared by the [place] FDA and  has been authorized for detection and/or diagnosis of SARS-CoV-2 by FDA under an Emergency Use Authorization (EUA). This EUA will remain  in effect (meaning this test can be used) for the duration of the COVID-19 declaration under Section 564(b)(1) of the Act, 21 U.S.C.section 360bbb-3(b)(1), unless the authorization is terminated  or revoked sooner.       Influenza A by PCR NEGATIVE NEGATIVE Final   Influenza B by PCR NEGATIVE NEGATIVE Final    Comment: (NOTE) The Xpert Xpress SARS-CoV-2/FLU/RSV plus assay is  intended as an aid in the diagnosis of influenza from Nasopharyngeal swab specimens and should not be used as a sole basis for treatment. Nasal washings and aspirates are unacceptable for Xpert Xpress SARS-CoV-2/FLU/RSV testing.  Fact Sheet for Patients: EntrepreneurPulse.com.au  Fact Sheet for Healthcare Providers: IncredibleEmployment.be  This test is not yet approved or cleared by the [place] FDA and has been authorized for detection and/or diagnosis of SARS-CoV-2 by FDA under an Emergency Use Authorization (EUA). This EUA will remain in effect (meaning this test can be used) for the duration of the COVID-19 declaration under Section 564(b)(1) of the Act, 21 U.S.C. section 360bbb-3(b)(1), unless the authorization is terminated or revoked.  Performed at [facility], [address] [place], [place] [address]      Labs: BNP (last 3 results) No results for input(s): BNP in the last 8760 hours. Basic Metabolic Panel: Recent Labs  Lab 01/21/22 1051  NA 137  K 4.6  CL 101  CO2 27  GLUCOSE 141*  BUN 10  CREATININE 0.55*  CALCIUM 9.1   Liver Function Tests: Recent Labs  Lab 01/21/22 1051  AST 17  ALT 14  ALKPHOS 66  BILITOT 0.6  PROT 7.9  ALBUMIN 3.9   No results for input(s): LIPASE, AMYLASE in the last 168 hours. No results for input(s): AMMONIA in the last 168 hours. CBC: Recent Labs  Lab 01/21/22 1051  WBC 9.0  NEUTROABS 5.9  HGB 13.5  HCT 40.5  MCV 92.9  PLT 157   Cardiac Enzymes: No results for input(s): CKTOTAL, CKMB, CKMBINDEX, TROPONINI in the last 168 hours. BNP: Invalid input(s): POCBNP CBG: Recent Labs  Lab 01/21/22 1734 01/21/22 2116 01/22/22 0732 01/22/22 1107  GLUCAP 114* 158* 111* 198*   D-Dimer No results for input(s): DDIMER in the last 72 hours. Hgb A1c No results for input(s): HGBA1C in the last 72 hours. Lipid Profile Recent Labs    01/22/22 0458  CHOL 116  HDL  37*  LDLCALC 31  TRIG 239*  CHOLHDL 3.1   Thyroid function studies No results for input(s): TSH, T4TOTAL, T3FREE, THYROIDAB in the last 72 hours.  Invalid input(s): FREET3 Anemia work up No results for input(s): VITAMINB12, FOLATE, FERRITIN, TIBC, IRON, RETICCTPCT in the last 72 hours. Urinalysis    Component Value Date/Time   APPEARANCEUR Clear 03/07/2021 0859   GLUCOSEU 1+ (A) 03/07/2021 0859   BILIRUBINUR Negative 03/07/2021 0859   PROTEINUR Negative 03/07/2021 0859   NITRITE Negative 03/07/2021 0859   LEUKOCYTESUR Negative 03/07/2021 0859   Sepsis Labs Invalid input(s): PROCALCITONIN,  WBC,  LACTICIDVEN Microbiology Recent Results (from the past 240 hour(s))  Resp Panel  by RT-PCR (Flu A&B, Covid) Nasopharyngeal Swab     Status: None   Collection Time: 01/21/22  5:44 PM   Specimen: Nasopharyngeal Swab; Nasopharyngeal(NP) swabs in vial transport medium  Result Value Ref Range Status   SARS Coronavirus 2 by RT PCR NEGATIVE NEGATIVE Final    Comment: (NOTE) SARS-CoV-2 target nucleic acids are NOT DETECTED.  The SARS-CoV-2 RNA is generally detectable in upper respiratory specimens during the acute phase of infection. The lowest concentration of SARS-CoV-2 viral copies this assay can detect is 138 copies/mL. A negative result does not preclude SARS-Cov-2 infection and should not be used as the sole basis for treatment or other patient management decisions. A negative result may occur with  improper specimen collection/handling, submission of specimen other than nasopharyngeal swab, presence of viral mutation(s) within the areas targeted by this assay, and inadequate number of viral copies(<138 copies/mL). A negative result must be combined with clinical observations, patient history, and epidemiological information. The expected result is Negative.  Fact Sheet for Patients:  EntrepreneurPulse.com.au  Fact Sheet for Healthcare Providers:   IncredibleEmployment.be  This test is no t yet approved or cleared by the [place] FDA and  has been authorized for detection and/or diagnosis of SARS-CoV-2 by FDA under an Emergency Use Authorization (EUA). This EUA will remain  in effect (meaning this test can be used) for the duration of the COVID-19 declaration under Section 564(b)(1) of the Act, 21 U.S.C.section 360bbb-3(b)(1), unless the authorization is terminated  or revoked sooner.       Influenza A by PCR NEGATIVE NEGATIVE Final   Influenza B by PCR NEGATIVE NEGATIVE Final    Comment: (NOTE) The Xpert Xpress SARS-CoV-2/FLU/RSV plus assay is intended as an aid in the diagnosis of influenza from Nasopharyngeal swab specimens and should not be used as a sole basis for treatment. Nasal washings and aspirates are unacceptable for Xpert Xpress SARS-CoV-2/FLU/RSV testing.  Fact Sheet for Patients: EntrepreneurPulse.com.au  Fact Sheet for Healthcare Providers: IncredibleEmployment.be  This test is not yet approved or cleared by the [place] FDA and has been authorized for detection and/or diagnosis of SARS-CoV-2 by FDA under an Emergency Use Authorization (EUA). This EUA will remain in effect (meaning this test can be used) for the duration of the COVID-19 declaration under Section 564(b)(1) of the Act, 21 U.S.C. section 360bbb-3(b)(1), unless the authorization is terminated or revoked.  Performed at [facility], [address] [place], [place] [address]     Please note: You were cared for by a hospitalist during your hospital stay. Once you are discharged, your primary care physician will handle any further medical issues. Please note that NO REFILLS for any discharge medications will be authorized once you are discharged, as it is imperative that you return to your primary care physician (or establish a relationship with a primary care physician  if you do not have one) for your post hospital discharge needs so that they can reassess your need for medications and monitor your lab values.    Time coordinating discharge: 40 minutes  SIGNED:   Shelly Coss, MD  [facility] 01/22/2022, 2:47 PM Pager [phone]  If 7PM-7AM, please contact night-coverage [email or web address] Password [email or web address]

## 2022-01-22 NOTE — Evaluation (Signed)
Occupational Therapy Evaluation Patient Details Name: Casey Adkins MRN: [id number] DOB: 1934-09-19 Today's Date: 01/22/2022   History of Present Illness 88 y.o. 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 CT head negative. Patient has an ear implant, cannot do MRI for brain.   Clinical Impression   Pt seen for OT evaluation this date. Upon arrival to room, pt seated EOB with son present (daughter present at end of session). Pt A&Ox4, reporting no pain, and agreeable to OT eval. Prior to admission, pt was independent in all ADLs and functional mobility (using a SPC only outside of the home), living in a 1-story home with wife. Pt denies any recent falls. Pt currently presents with decreased balance, requiring MIN GUARD for functional mobility (~150ft) with RW (MIN A without AD), MIN GUARD for sit>stand LB dressing, and SET-UP assist for seated UB ADLs. Pt denied dizziness during session and no visual deficits appreciated during assessment. Pt would benefit from additional skilled OT services to maximize return to PLOF and minimize risk of future falls, injury, caregiver burden, and readmission. Upon discharge, recommend HHOT services, although family and pt declining HHOT services at this time and stating that they are able to provide level of assistance pt currently requires for ADLs and functional mobility.   Recommendations for follow up therapy are one component of a multi-disciplinary discharge planning process, led by the attending physician.  Recommendations may be updated based on patient status, additional functional criteria and insurance authorization.   Follow Up Recommendations  Home health OT (although family currently declining at this time)    Assistance Recommended at Discharge Intermittent  Supervision/Assistance  Patient can return home with the following A little help with walking and/or transfers;A little help with bathing/dressing/bathroom;Help with stairs or ramp for entrance    Functional Status Assessment  Patient has had a recent decline in their functional status and demonstrates the ability to make significant improvements in function in a reasonable and predictable amount of time.  Equipment Recommendations  None recommended by OT (pt has all necessary DME)       Precautions / Restrictions Precautions Precautions: Fall Restrictions Weight Bearing Restrictions: No      Mobility Bed Mobility               General bed mobility comments: Not assessed, pt seated EOB at beginning of session and in recliner at end of session    Transfers Overall transfer level: Needs assistance Equipment used: Rolling walker (2 wheels) Transfers: Sit to/from Stand Sit to Stand: Min assist, Min guard           General transfer comment: Required MIN A for steadying when upright during initial transfer. Progressed to MIN GUARD in subsequent transfers with RW present for UE support when upright      Balance Overall balance assessment: Needs assistance Sitting-balance support: No upper extremity supported, Feet supported Sitting balance-Leahy Scale: Good Sitting balance - Comments: Good sitting balance reaching outside BOS for LB dressing   Standing balance support: Bilateral upper extremity supported, During functional activity, Reliant on assistive device for balance Standing balance-Leahy Scale: Fair Standing balance comment: with UE support from RW, requires only MIN GUARD to walk 111ft                           ADL  either performed or assessed with clinical judgement   ADL Overall ADL's : Needs assistance/impaired                     Lower Body Dressing: Min guard;Sit to/from stand Lower Body Dressing Details (indicate cue type and  reason): to don underwear sit>stand and to don/doff shoes while seated in recliner             Functional mobility during ADLs: Minimal assistance;Min guard;Rolling walker (2 wheels) (MIN A without AD, MIN GUARD with RW) General ADL Comments: Anticipate SUP/SET-UP for seated UB ADLs     Vision Baseline Vision/History: 1 Wears glasses Ability to See in Adequate Light: 0 Adequate Patient Visual Report: No change from baseline              Pertinent Vitals/Pain Pain Assessment Pain Assessment: No/denies pain     Hand Dominance Right   Extremity/Trunk Assessment Upper Extremity Assessment Upper Extremity Assessment: Overall WFL for tasks assessed   Lower Extremity Assessment Lower Extremity Assessment: Generalized weakness       Communication Communication Communication: HOH   Cognition Arousal/Alertness: Awake/alert Behavior During Therapy: WFL for tasks assessed/performed Overall Cognitive Status: Within Functional Limits for tasks assessed                                                  Home Living Family/patient expects to be discharged to:: Private residence Living Arrangements: Spouse/significant other (wife) Available Help at Discharge: Family;Available PRN/intermittently (Son & daughter live nearby and can assist whenever needed) Type of Home: House Home Access: Stairs to enter [employer] of Steps: 4 Entrance Stairs-Rails: Right;Left Home Layout: One level;Other (Comment) (reports haing two steps to get into den, has railing available)     Bathroom Shower/Tub: Walk-in shower         Home Equipment: Grab bars - tub/shower;Shower seat;Cane - single point   Additional Comments: Has shower chair but refuses to use  Lives With: Spouse    Prior Functioning/Environment Prior Level of Function : Independent/Modified Independent             Mobility Comments: Pt reports being independent for household mobilty. Uses  cane over uneven surfaces/community ADLs Comments: Pt reports being independent in self care tasks at home.        OT Problem List: Decreased activity tolerance;Impaired balance (sitting and/or standing)      OT Treatment/Interventions: Self-care/ADL training;Therapeutic exercise;DME and/or AE instruction;Neuromuscular education;Therapeutic activities;Patient/family education;Balance training    OT Goals(Current goals can be found in the care plan section) Acute Rehab OT Goals Patient Stated Goal: to return home OT Goal Formulation: With patient/family Time For Goal Achievement: 02/05/22 Potential to Achieve Goals: Good ADL Goals Pt Will Perform Grooming: with supervision;standing Pt Will Perform Lower Body Dressing: with supervision;sit to/from stand Pt Will Transfer to Toilet: with supervision;ambulating;regular height toilet  OT Frequency: Min 3X/week       AM-PAC OT "6 Clicks" Daily Activity     Outcome Measure Help from another person eating meals?: None Help from another person taking care of personal grooming?: A Little Help from another person toileting, which includes using toliet, bedpan, or urinal?: A Little Help from another person bathing (including washing, rinsing, drying)?: A Little Help from another person to put on and taking off regular upper body clothing?: None Help from  another person to put on and taking off regular lower body clothing?: A Little 6 Click Score: 20   End of Session Equipment Utilized During Treatment: Gait belt;Rolling walker (2 wheels) Nurse Communication: Mobility status  Activity Tolerance: Patient tolerated treatment well Patient left: in chair;with call bell/phone within reach;with family/visitor present  OT Visit Diagnosis: Unsteadiness on feet (R26.81)                Time: 2890-2284 OT Time Calculation (min): 30 min Charges:  OT General Charges $OT Visit: 1 Visit OT Evaluation $OT Eval Moderate Complexity: 1 Mod OT  Treatments $Self Care/Home Management : 8-22 mins  Fredirick Maudlin, OTR/L [place]

## 2022-01-22 NOTE — Care Management Obs Status (Signed)
MEDICARE OBSERVATION STATUS NOTIFICATION   Patient Details  Name: Casey Adkins MRN: [id number] Date of Birth: May 06, 1934   Medicare Observation Status Notification Given:  Yes    Shelbie Hutching, RN 01/22/2022, 1:01 PM

## 2022-01-22 NOTE — Progress Notes (Signed)
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   Medicare annual wellness visit, initial 07/17/2016   B12 deficiency 07/17/2016   History of CVA (cerebrovascular accident) without residual deficits 05/14/2016   PIN III (prostatic intraepithelial neoplasia III) 04/11/2015   Personal history of other malignant neoplasm of skin 09/16/2014   History of nonmelanoma skin cancer 09/16/2014   History of colonic polyps 08/23/2014   Diverticulosis of large intestine 08/23/2014   Benign hematuria 07/20/2014   Type 2 diabetes mellitus ([place]) 07/04/2014   Malignant neoplasm of bladder ([place]) 07/04/2014   DM (diabetes mellitus) type II controlled with renal manifestation ([place]) 07/04/2014   Benign essential hypertension 07/04/2014   Urge incontinence 12/29/2013   Malignant neoplasm of lateral wall of urinary bladder ([place]) 09/09/2012   Incomplete emptying of bladder 09/09/2012   Enlarged prostate with lower urinary tract symptoms (LUTS) 09/09/2012   Elevated prostate specific antigen (PSA) 09/09/2012   Chronic prostatitis 09/09/2012   Carcinoma in situ of prostate 09/09/2012   Kidney stone 09/09/2012   Benign prostatic hyperplasia with urinary obstruction 09/09/2012   Past Medical History:  Past Medical History:  Diagnosis Date   Allergic rhinitis    Arthritis    Cancer ([place])    Bladder Cancer   Cancer ([place])    Prostate Cancer    Diabetes mellitus without complication ([place])    Diverticulosis    Dysphagia    GERD (gastroesophageal reflux disease)    History of kidney stones    h/o   Hypertension    Stroke ([place]) 2006   possible light stroke-no deficits   Past Surgical History:  Past Surgical History:  Procedure Laterality Date   CYSTOSCOPY W/ RETROGRADES Bilateral 06/07/2019   Procedure: CYSTOSCOPY WITH RETROGRADE PYELOGRAM;  Surgeon: Hollice Espy, MD;  Location: [facility] ORS;  Service: Urology;  Laterality: Bilateral;   CYSTOSCOPY WITH BIOPSY N/A 06/07/2019   Procedure: CYSTOSCOPY WITH Bladder BIOPSY;  Surgeon: Hollice Espy, MD;  Location: [facility] ORS;  Service: Urology;  Laterality: N/A;   ESOPHAGOGASTRODUODENOSCOPY N/A 04/21/2015   Procedure: ESOPHAGOGASTRODUODENOSCOPY (EGD);  Surgeon: Hulen Luster, MD;  Location: [facility] ENDOSCOPY;  Service: Gastroenterology;  Laterality: N/A;   LITHOTRIPSY     SINUS SURGERY WITH INSTATRAK  2008   STAPEDES SURGERY Left age 68   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 male who presented to ED on 01/21/22 with dizziness, worsening balance, right sided weakness. Unable to obtain MRI due to ear implant. CT head showed no acute findings, chronic small vessel ischemic disease and chronic left basal ganglia infarct. Admitted with concern for new CVA. PMHx includes CVA (10/10/21), TIA, HTN, DM, bladder cancer, prostate cancer, BPG, left middle ear stapes surgery with implant, and GERD. 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 Hx of esophageal dysphagia requiring dilation in the  past; reported 1 year hx of worsening dysphagia prior to CVA. Had MBS on 11/13/21 with findings of mild oropharyngeal dysphagia and recommendation for regular/thin liquids with precautions (slow rate, single bites/sips).   Assessment / Plan /  Recommendation Clinical Impression   Patient presents with mildly worsened dysarthria (moderate), aphasia (mild), and cognitive deficits (mild) from baseline. Contributing factors of fatigue and stress may also impact his cognitive-linguistic and motor speech abilities. Per nsg, pt passed the [place] and is consuming a regular diet, taking medications without difficulty. Pt, spouse deny any s/sx aspiration with meals and report pt adhering to precautions trained in OP therapy. Administered the [place]; pt scored 57/60, consistent with prior naming abilities. However, increased hesitations and anomia are noted at the conversation level. Intelligibility worsened from baseline, judged to be 70% by familiar listener at conversation level. Cognitive testing initiated via SLUMS. Pt scored 15/22 items administered (final task was omitted due to pt's hearing acuity as well as time constraints, as MD arrived to assess pt). Patient was noted to have increased difficulty with simple verbal problem solving as well as delayed recall, working memory, and selective attention. SLP to follow during acute stay for ongoing assessment to determine appropriate services at d/c as well as intervention to maximize safety awareness and communication abilities.     SLP Assessment  SLP Recommendation/Assessment: Patient needs continued Speech Lanaguage Pathology Services SLP Visit Diagnosis: Dysarthria and anarthria (R47.1);Aphasia (R47.01);Cognitive communication deficit (R41.841)    Recommendations for follow up therapy are one component of a multi-disciplinary discharge planning process, led by the attending physician.  Recommendations may be updated based on patient status, additional functional criteria and insurance authorization.    Follow Up Recommendations  Other (comment) (will await PT assessment to determine physical needs)    Assistance Recommended at Discharge  Frequent or constant  Supervision/Assistance  Functional Status Assessment Patient has had a recent decline in their functional status and demonstrates the ability to make significant improvements in function in a reasonable and predictable amount of time.  Frequency and Duration min 1 x/week  1 week      SLP Evaluation Cognition  Overall Cognitive Status: History of cognitive impairments - at baseline Arousal/Alertness: Awake/alert Orientation Level: Oriented X4 Attention: Selective Selective Attention: Impaired Memory: Impaired Memory Impairment:  (Delayed recall 3/5 words) Problem Solving: Impaired Problem Solving Impairment: Verbal basic (slow processing, working memory contribute) Behaviors: Poor frustration tolerance;Perseveration Safety/Judgment: Impaired       Comprehension  Auditory Comprehension Overall Auditory Comprehension: Other (comment) (difficult to assess as pt has hearing loss and is not wearing his hearing aids) Conversation: Simple Other Conversation Comments: needs repetition, comprehension improves with use of clear mask for lipreading, use of clear speech Interfering Components: Attention;Processing speed;Working Marine scientist;Hearing EffectiveTechniques: Increased volume;Pausing;Slowed speech;Stressing words;Repetition Visual Recognition/Discrimination Discrimination: Not tested Reading Comprehension Reading Status: Impaired Paragraph Level: Impaired Functional Environmental (signs, name badge): Within functional limits    Expression Expression Primary Mode of Expression: Verbal Verbal Expression Overall Verbal Expression: Impaired at baseline Initiation: No impairment Automatic Speech: Name;Social Response Level of Generative/Spontaneous Verbalization: Conversation Naming: Impairment Confrontation: Within functional limits [employer] 57/60) Divergent: Other (comment) (7 animals in 60 seconds) Other Naming Comments: perseveration noted x3, stuck in set. appears  slightly worsened from baseline (fatigue, unfamiliar setting, stress may contribute) Pragmatics: No impairment Other Verbal Expression Comments: hesitation, anomia in conversation. spouse reports some instances of dysnomia Written Expression Dominant Hand: Right Written Expression: Not tested (impaired at baseline (sentence  level). pt also has essential tremor which makes it difficult for him to write. able to write numbers 1-12 on clock drawing grossly legible in context)   Oral / Motor  Oral Motor/Sensory Function Overall Oral Motor/Sensory Function: Mild impairment Facial ROM: Reduced right;Suspected CN VII (facial) dysfunction Facial Symmetry: Abnormal symmetry right;Suspected CN VII (facial) dysfunction Lingual ROM: Within Functional Limits Lingual Symmetry: Within Functional Limits [place]: Within Functional Limits Mandible: Within Functional Limits Motor Speech Overall Motor Speech: Impaired at baseline Respiration: Impaired Level of Impairment: Phrase (noted reduced breath support, shorter breath groups from baseline (~3 words)) Phonation: Hoarse;Low vocal intensity Resonance: Hypernasality Articulation: Impaired (imprecision, discoordination) Level of Impairment: Sentence Intelligibility: Intelligibility reduced Conversation: 50-74% accurate (70%) Motor Speech Errors: Aware;Inconsistent Interfering Components: Premorbid status Effective Techniques: Slow rate;Increased vocal intensity           Deneise Lever, MS, [employer] Speech-Language Pathologist [phone]  Aliene Altes 01/22/2022, 10:34 AM

## 2022-01-23 ENCOUNTER — Ambulatory Visit: Payer: Medicare Other | Admitting: Speech Pathology

## 2022-01-28 ENCOUNTER — Ambulatory Visit: Payer: Medicare Other | Attending: Internal Medicine | Admitting: Speech Pathology

## 2022-01-28 ENCOUNTER — Other Ambulatory Visit: Payer: Self-pay

## 2022-01-28 DIAGNOSIS — R278 Other lack of coordination: Secondary | ICD-10-CM | POA: Insufficient documentation

## 2022-01-28 DIAGNOSIS — R471 Dysarthria and anarthria: Secondary | ICD-10-CM | POA: Diagnosis present

## 2022-01-28 DIAGNOSIS — R262 Difficulty in walking, not elsewhere classified: Secondary | ICD-10-CM | POA: Insufficient documentation

## 2022-01-28 DIAGNOSIS — R269 Unspecified abnormalities of gait and mobility: Secondary | ICD-10-CM | POA: Insufficient documentation

## 2022-01-28 DIAGNOSIS — R41841 Cognitive communication deficit: Secondary | ICD-10-CM | POA: Diagnosis present

## 2022-01-28 DIAGNOSIS — R4701 Aphasia: Secondary | ICD-10-CM | POA: Diagnosis present

## 2022-01-28 DIAGNOSIS — R1312 Dysphagia, oropharyngeal phase: Secondary | ICD-10-CM | POA: Insufficient documentation

## 2022-01-28 DIAGNOSIS — M6281 Muscle weakness (generalized): Secondary | ICD-10-CM | POA: Diagnosis present

## 2022-01-28 DIAGNOSIS — R2681 Unsteadiness on feet: Secondary | ICD-10-CM | POA: Insufficient documentation

## 2022-01-28 NOTE — Therapy (Signed)
[place] MAIN [facility] SERVICES [address] [place], [place], [address] Phone: [phone]   Fax:  [phone]  Speech Language Pathology Treatment and Recertification  Patient Details  Name: Casey Adkins MRN: [id number] Date of Birth: 1934-07-28 Referring Provider (SLP): Rusty Aus, MD   Encounter Date: 01/28/2022   End of Session - 01/28/22 1143     Visit Number 13    Number of Visits 28    Date for SLP Re-Evaluation 04/28/22    SLP Start Time 1000    SLP Stop Time  50    SLP Time Calculation (min) 60 min             Past Medical History:  Diagnosis Date   Allergic rhinitis    Arthritis    Cancer ([place])    Bladder Cancer   Cancer [facility])    Prostate Cancer   Diabetes mellitus without complication ([place])    Diverticulosis    Dysphagia    GERD (gastroesophageal reflux disease)    History of kidney stones    h/o   Hypertension    Stroke [facility]) 2006   possible light stroke-no deficits    Past Surgical History:  Procedure Laterality Date   CYSTOSCOPY W/ RETROGRADES Bilateral 06/07/2019   Procedure: CYSTOSCOPY WITH RETROGRADE PYELOGRAM;  Surgeon: Hollice Espy, MD;  Location: [facility] ORS;  Service: Urology;  Laterality: Bilateral;   CYSTOSCOPY WITH BIOPSY N/A 06/07/2019   Procedure: CYSTOSCOPY WITH Bladder BIOPSY;  Surgeon: Hollice Espy, MD;  Location: [facility] ORS;  Service: Urology;  Laterality: N/A;   ESOPHAGOGASTRODUODENOSCOPY N/A 04/21/2015   Procedure: ESOPHAGOGASTRODUODENOSCOPY (EGD);  Surgeon: Hulen Luster, MD;  Location: [facility] ENDOSCOPY;  Service: Gastroenterology;  Laterality: N/A;   LITHOTRIPSY     SINUS SURGERY WITH INSTATRAK  2008   STAPEDES SURGERY Left age 79   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   Subjective Assessment - 01/28/22 1109     Subjective "My speech is more slurred"    Patient is accompained by: Family  member   Spouse present for session   Currently in Pain? No/denies    Pain Score 0-No pain                   ADULT SLP TREATMENT - 01/28/22 1111       General Information   Behavior/Cognition Alert;Cooperative;Pleasant mood    HPI Casey Adkins is an 88 y.o. male referred for MBS by his PCP Dr. Emily Filbert. Pt admitted 10/10/21- 10/11/21 for CVA. 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 Unable to obtain MRI due to ear implant. Hx of esophageal dysphagia requiring dilation in the past; reports 1 year hx of worsening dysphagia prior to CVA. Neurology noted right upper motor neuron pattern weakness, suspected small vessel lacunar infarction. 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 Wife assists with history given pt's dysarthria, aphasia.      Treatment Provided   Treatment provided Cognitive-Linquistic      Pain Assessment   Pain Assessment No/denies pain      Cognitive-Linquistic Treatment   Treatment focused on Dysarthria;Aphasia;Patient/family/caregiver education    Skilled Treatment Pt/spouse report increased slurred speech and increased wordfinding difficulties since hospitalization 01/21/22. Dysarthria targeted during paragraph level reading tasks. During reading task, pt intelligibilty  was ~70% with mod cues to use dysarthria compensations (SLOP). Pt required cues to slow down, pause, and repeat unintelligible speech. Several paraphasias noted with pt intermittently aware (e.g. hospital/clinic, mammal/animals, becoming/being). Pt completed comprehension questions with 60% accuracy (3/5), increased to 100% accuracy with mod cues to reread the question and double check the paragraph for the answer. Completed assessment of reading and language comprehension with the Reading Comprehension Battery for Aphasia (RCBA). Word-Visual subtest, 10/10 correct responses.  Functional reading subtest, 7/10 correct responses. Synonyms subtest, 9/10 correct responses. Sentence picture subtest, 9/10 correct responses. Paragraph-factual subtest, 9/10 correct responses. Paragraph-inferential subtest, 8/10 correct responses. Morpho-syntax subtest, 9/10 correct reponses. In addition to errors in reading comprehension, noted paraphasias (day/morning) and reduced intelligibility due to dysarthria (final consonant omission, imprecise articulation, difficulty with /r/) at phrase level.  Pt reports noticing some of these changes at home. Discussed updated goals and treatment for upcoming sessions with pt/spouse.      Assessment / Recommendations / Plan   Plan Goals updated      Progression Toward Goals   Progression toward goals Goals downgraded   Patient recently hospitalized, goals adjusted based on reassessment             SLP Education - 01/28/22 1155     Education Details Modified goals    Person(s) Educated Patient;Spouse    Methods Explanation    Comprehension Verbalized understanding              SLP Short Term Goals - 01/28/22 1014       SLP SHORT TERM GOAL #1   Title Pt will use aphasia compensations during 5-8 minutes conversation with occasional min cues.    Time 10    Period --   sessions   Status Achieved      SLP SHORT TERM GOAL #2   Title Pt will demonstrate dysarthria HEP with rare min cues.    Time 10    Period --   sessions   Status Achieved      SLP SHORT TERM GOAL #3   Title Patient will use compensations for dysarthria in sentence responses for >95% intelligibility.    Time 10    Period --   sessions   Status Achieved      SLP SHORT TERM GOAL #4   Title Pt will complete further assessment of cognitive-communication PRN.    Time 10    Period --   sessions   Status Achieved      SLP SHORT TERM GOAL #5   Title Patient will use compensations for dysarthria in sentence-level reading for >95% intelligibility.    Time 10     Period --   sessions   Status Revised   revised after worsening symptoms, possible new CVA on 01/21/22     SLP SHORT TERM GOAL #6   Title Patient will demonstrate alternating attention over 5 minutes between 2 simple cognitive-linguistic task with 90% accuracy.    Time 10    Period --   sessions   Status Revised   revised after worsening symptoms, possible new CVA on 01/21/22             SLP Long Term Goals - 01/28/22 1016       SLP LONG TERM GOAL #1   Title Pt will use aphasia compensations effectively during 15 minutes mod complex conversation with modified independence.    Time 12    Period Weeks    Status Revised   revised  after worsening symptoms, possible new CVA on 01/21/22     SLP LONG TERM GOAL #2   Title Pt will use aphasia compensations with modified independence to resolve communication breakdown at least 3 occasions outside of ST, per pt/spouse report.    Time 12    Period Weeks    Status On-going      SLP LONG TERM GOAL #3   Title Patient will use compensations for dysarthria in 15 minute conversation for >95% intelligibility with no more than 2 repetitions necessary.    Time 12    Period Weeks    Status Revised   revised after worsening symptoms, possible new CVA on 01/21/22     SLP LONG TERM GOAL #4   Title Patient will demonstrate swallowing precautions independently x3 sessions with no overt s/sx aspiration.    Time 12    Period Weeks    Status On-going              Plan - 01/28/22 1150     Clinical Impression Statement Patient presents with moderate dysarthria, mild aphasia, and cognitive communication deficits which appear slightly worsened from baseline since hospitalization 01/21/22 for new stroke-like symptoms. Unable to perform MRI due to inner ear implant, however pt with increased right sided weakness, balance difficulties; now needing walker to ambulate. Awaiting PT orders. Patient goals updated/recertification completed based on reassessment.  Pt demonstrates decreased intelligibility and difficulties with attention, reading comprehension, and inferencing/reasoning. Pt/spouse deny changes in swallow function. I recommend continued skilled ST to improve intelligibility, verbal expression and swallowing safety necessary for increased communicatory success and independence in the home and community environments.    Speech Therapy Frequency 2x / week    Duration 12 weeks    Treatment/Interventions Aspiration precaution training;Environmental controls;Language facilitation;Cueing hierarchy;Oral motor exercises;SLP instruction and feedback;Pharyngeal strengthening exercises;Compensatory techniques;Cognitive reorganization;Functional tasks;Compensatory strategies;Diet toleration management by SLP;Internal/external aids;Multimodal communcation approach;Patient/family education    Potential to Achieve Goals Good    Consulted and Agree with Plan of Care Patient;Family member/caregiver             Patient will benefit from skilled therapeutic intervention in order to improve the following deficits and impairments:   Dysarthria and anarthria  Aphasia  Cognitive communication deficit  Dysphagia, oropharyngeal phase    Problem List Patient Active Problem List   Diagnosis Date Noted   HLD (hyperlipidemia) 01/21/2022   Essential tremor 01/21/2022   Stroke-like episode    Overweight (BMI 25.0-29.9) 10/11/2021   Hypertriglyceridemia 10/11/2021   Stroke-like symptom 10/10/2021   Diabetes mellitus without complication ([place]) 48/54/6270   HTN (hypertension) 10/10/2021   Pyuria 12/25/2016   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   Medicare annual wellness visit, initial 07/17/2016   B12 deficiency 07/17/2016   History of CVA (cerebrovascular accident) without residual deficits 05/14/2016   PIN III (prostatic intraepithelial neoplasia III) 04/11/2015   Personal history of other malignant neoplasm of skin 09/16/2014   History of nonmelanoma  skin cancer 09/16/2014   History of colonic polyps 08/23/2014   Diverticulosis of large intestine 08/23/2014   Benign hematuria 07/20/2014   Type 2 diabetes mellitus ([place]) 07/04/2014   Malignant neoplasm of bladder ([place]) 07/04/2014   DM (diabetes mellitus) type II controlled with renal manifestation ([place]) 07/04/2014   Benign essential hypertension 07/04/2014   Urge incontinence 12/29/2013   Malignant neoplasm of lateral wall of urinary bladder ([place]) 09/09/2012   Incomplete emptying of bladder 09/09/2012   Enlarged prostate with lower urinary tract symptoms (LUTS) 09/09/2012   Elevated  prostate specific antigen (PSA) 09/09/2012   Chronic prostatitis 09/09/2012   Carcinoma in situ of prostate 09/09/2012   Kidney stone 09/09/2012   Benign prostatic hyperplasia with urinary obstruction 09/09/2012  [facility] SLP Student    Aliene Altes, CCC-SLP 01/28/2022, 1:54 PM  [place] MAIN [facility] SERVICES [address] [place], [place], [address] Phone: [phone]   Fax:  [phone]   Name: TESEAN STUMP MRN: [id number] Date of Birth: December 18, 1933

## 2022-01-30 ENCOUNTER — Other Ambulatory Visit: Payer: Self-pay

## 2022-01-30 ENCOUNTER — Ambulatory Visit: Payer: Medicare Other | Admitting: Speech Pathology

## 2022-01-30 DIAGNOSIS — R471 Dysarthria and anarthria: Secondary | ICD-10-CM

## 2022-01-30 DIAGNOSIS — R41841 Cognitive communication deficit: Secondary | ICD-10-CM

## 2022-01-30 DIAGNOSIS — R4701 Aphasia: Secondary | ICD-10-CM

## 2022-01-30 NOTE — Therapy (Signed)
[place] MAIN [facility] SERVICES [address] [place], [place], [address] Phone: [phone]   Fax:  [phone]  Speech Language Pathology Treatment  Patient Details  Name: Casey Adkins MRN: [id number] Date of Birth: 1934/10/30 Referring Provider (SLP): Rusty Aus, MD   Encounter Date: 01/30/2022   End of Session - 01/31/22 8850     Visit Number 14    Number of Visits 22    Date for SLP Re-Evaluation 04/28/22    SLP Start Time 1000    SLP Stop Time  1100    SLP Time Calculation (min) 60 min             Past Medical History:  Diagnosis Date   Allergic rhinitis    Arthritis    Cancer ([place])    Bladder Cancer   Cancer [facility])    Prostate Cancer   Diabetes mellitus without complication ([place])    Diverticulosis    Dysphagia    GERD (gastroesophageal reflux disease)    History of kidney stones    h/o   Hypertension    Stroke [facility]) 2006   possible light stroke-no deficits    Past Surgical History:  Procedure Laterality Date   CYSTOSCOPY W/ RETROGRADES Bilateral 06/07/2019   Procedure: CYSTOSCOPY WITH RETROGRADE PYELOGRAM;  Surgeon: Hollice Espy, MD;  Location: [facility] ORS;  Service: Urology;  Laterality: Bilateral;   CYSTOSCOPY WITH BIOPSY N/A 06/07/2019   Procedure: CYSTOSCOPY WITH Bladder BIOPSY;  Surgeon: Hollice Espy, MD;  Location: [facility] ORS;  Service: Urology;  Laterality: N/A;   ESOPHAGOGASTRODUODENOSCOPY N/A 04/21/2015   Procedure: ESOPHAGOGASTRODUODENOSCOPY (EGD);  Surgeon: Hulen Luster, MD;  Location: [facility] ENDOSCOPY;  Service: Gastroenterology;  Laterality: N/A;   LITHOTRIPSY     SINUS SURGERY WITH INSTATRAK  2008   STAPEDES SURGERY Left age 89   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   Subjective Assessment - 01/30/22 1723     Subjective "It's hard for me to slow down, I want to talk regular"    Patient is accompained by:  Family member   Spouse present for session   Currently in Pain? No/denies   Reports feeling dizzy   Pain Score 0-No pain                   ADULT SLP TREATMENT - 01/30/22 1535       General Information   Behavior/Cognition Alert;Cooperative;Pleasant mood    HPI Nakota Elsen is an 88 y.o. male referred for MBS by his PCP Dr. Emily Filbert. Pt admitted 10/10/21- 10/11/21 for CVA. 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 Unable to obtain MRI due to ear implant. Hx of esophageal dysphagia requiring dilation in the past; reports 1 year hx of worsening dysphagia prior to CVA. Neurology noted right upper motor neuron pattern weakness, suspected small vessel lacunar infarction. 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 Wife assists with history given pt's dysarthria, aphasia.      Treatment Provided   Treatment provided Cognitive-Linquistic      Cognitive-Linquistic Treatment   Treatment focused on Dysarthria;Aphasia;Patient/family/caregiver education    Skilled Treatment Reviewed pt's HEP and usage of dysarthria compensations (SLOP). Pt completed HEP with ~80% intelligibility with min-mod cues. Pt benefited from cues to slow down, pause,  swallow  saliva, and to repeat unintelligible phrases. Targeted dysarthria in phrase level and sentence level reading tasks. During these tasks, pt intelligibility was ~70%, improved with mod cues to use SLOP strategies. Pt reports difficulty with slowing down speech and wanting to use normal rate. Pt educated on using strategies to help improve intelligibility and the importance of practice to improve carryover. Facilitated pt awareness of rushed speech and encouraged pt to repeat himself when he notices he is talking fast. Targeted naming/word finding in category naming activity. Pt was able to name categories with 70% accuracy (7/10),  increased to 100% accuracy with min-mod verbal cues (semantic feature analysis). Pt provided an item for each category with 90% accuracy with min cues (9/10). Provided a list of phrases for pt to practice using strategies at home.      Assessment / Recommendations / Plan   Plan Continue with current plan of care      Progression Toward Goals   Progression toward goals Progressing toward goals              SLP Education - 01/30/22 1725     Education Details Slow rate to increase intelligibility, Practice HEP    Person(s) Educated Patient;Spouse    Methods Explanation    Comprehension Verbalized understanding              SLP Short Term Goals - 01/28/22 1014       SLP SHORT TERM GOAL #1   Title Pt will use aphasia compensations during 5-8 minutes conversation with occasional min cues.    Time 10    Period --   sessions   Status Achieved      SLP SHORT TERM GOAL #2   Title Pt will demonstrate dysarthria HEP with rare min cues.    Time 10    Period --   sessions   Status Achieved      SLP SHORT TERM GOAL #3   Title Patient will use compensations for dysarthria in sentence responses for >95% intelligibility.    Time 10    Period --   sessions   Status Achieved      SLP SHORT TERM GOAL #4   Title Pt will complete further assessment of cognitive-communication PRN.    Time 10    Period --   sessions   Status Achieved      SLP SHORT TERM GOAL #5   Title Patient will use compensations for dysarthria in sentence-level reading for >95% intelligibility.    Time 10    Period --   sessions   Status Revised   revised after worsening symptoms, possible new CVA on 01/21/22     SLP SHORT TERM GOAL #6   Title Patient will demonstrate alternating attention over 5 minutes between 2 simple cognitive-linguistic task with 90% accuracy.    Time 10    Period --   sessions   Status Revised   revised after worsening symptoms, possible new CVA on 01/21/22             SLP Long  Term Goals - 01/28/22 1016       SLP LONG TERM GOAL #1   Title Pt will use aphasia compensations effectively during 15 minutes mod complex conversation with modified independence.    Time 12    Period Weeks    Status Revised   revised after worsening symptoms, possible new CVA on 01/21/22     SLP LONG TERM GOAL #2   Title Pt will  use aphasia compensations with modified independence to resolve communication breakdown at least 3 occasions outside of ST, per pt/spouse report.    Time 12    Period Weeks    Status On-going      SLP LONG TERM GOAL #3   Title Patient will use compensations for dysarthria in 15 minute conversation for >95% intelligibility with no more than 2 repetitions necessary.    Time 12    Period Weeks    Status Revised   revised after worsening symptoms, possible new CVA on 01/21/22     SLP LONG TERM GOAL #4   Title Patient will demonstrate swallowing precautions independently x3 sessions with no overt s/sx aspiration.    Time 12    Period Weeks    Status On-going              Plan - 01/30/22 1716     Clinical Impression Statement Patient presents with moderate dysarthria, mild aphasia, and cognitive communication deficits which appear slightly worsened from baseline since hospitalization 01/21/22 for new stroke-like symptoms. Pt reports new onset of dizziness that started 2-3 days ago. Pt has OT/PT evaluations scheduled for 01/31/22. Pt reports frustration with having to talk slower, but is receptive to teaching. Continue to reinforce use of dysathria compensations to improve speech intelligibility. Pt/spouse deny changes in swallow function. I recommend continued skilled ST to improve intelligibility, verbal expression and swallowing safety necessary for increased communicatory success and independence in the home and community environments.    Speech Therapy Frequency 2x / week    Duration 12 weeks    Treatment/Interventions Aspiration precaution  training;Environmental controls;Language facilitation;Cueing hierarchy;Oral motor exercises;SLP instruction and feedback;Pharyngeal strengthening exercises;Compensatory techniques;Cognitive reorganization;Functional tasks;Compensatory strategies;Diet toleration management by SLP;Internal/external aids;Multimodal communcation approach;Patient/family education    Potential to Achieve Goals Good    Consulted and Agree with Plan of Care Patient;Family member/caregiver             Patient will benefit from skilled therapeutic intervention in order to improve the following deficits and impairments:   Dysarthria and anarthria  Aphasia  Cognitive communication deficit    Problem List Patient Active Problem List   Diagnosis Date Noted   HLD (hyperlipidemia) 01/21/2022   Essential tremor 01/21/2022   Stroke-like episode    Overweight (BMI 25.0-29.9) 10/11/2021   Hypertriglyceridemia 10/11/2021   Stroke-like symptom 10/10/2021   Diabetes mellitus without complication ([place]) 54/07/8118   HTN (hypertension) 10/10/2021   Pyuria 12/25/2016   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   Medicare annual wellness visit, initial 07/17/2016   B12 deficiency 07/17/2016   History of CVA (cerebrovascular accident) without residual deficits 05/14/2016   PIN III (prostatic intraepithelial neoplasia III) 04/11/2015   Personal history of other malignant neoplasm of skin 09/16/2014   History of nonmelanoma skin cancer 09/16/2014   History of colonic polyps 08/23/2014   Diverticulosis of large intestine 08/23/2014   Benign hematuria 07/20/2014   Type 2 diabetes mellitus ([place]) 07/04/2014   Malignant neoplasm of bladder ([place]) 07/04/2014   DM (diabetes mellitus) type II controlled with renal manifestation ([place]) 07/04/2014   Benign essential hypertension 07/04/2014   Urge incontinence 12/29/2013   Malignant neoplasm of lateral wall of urinary bladder ([place]) 09/09/2012   Incomplete emptying of bladder  09/09/2012   Enlarged prostate with lower urinary tract symptoms (LUTS) 09/09/2012   Elevated prostate specific antigen (PSA) 09/09/2012   Chronic prostatitis 09/09/2012   Carcinoma in situ of prostate 09/09/2012   Kidney stone 09/09/2012   Benign prostatic hyperplasia with  urinary obstruction 09/09/2012  [facility] SLP Student    Aliene Altes, [employer] 01/31/2022, 8:23 AM  [place] MAIN [facility] SERVICES [address] [place], [place], [address] Phone: [phone]   Fax:  [phone]   Name: Casey Adkins MRN: [id number] Date of Birth: [birth year]

## 2022-01-31 ENCOUNTER — Ambulatory Visit: Payer: Medicare Other

## 2022-01-31 VITALS — BP 136/78 | HR 62 | Ht 70.0 in | Wt 186.0 lb

## 2022-01-31 DIAGNOSIS — R262 Difficulty in walking, not elsewhere classified: Secondary | ICD-10-CM

## 2022-01-31 DIAGNOSIS — R471 Dysarthria and anarthria: Secondary | ICD-10-CM | POA: Diagnosis not present

## 2022-01-31 DIAGNOSIS — R2681 Unsteadiness on feet: Secondary | ICD-10-CM

## 2022-01-31 DIAGNOSIS — R278 Other lack of coordination: Secondary | ICD-10-CM

## 2022-01-31 DIAGNOSIS — M6281 Muscle weakness (generalized): Secondary | ICD-10-CM

## 2022-01-31 DIAGNOSIS — R269 Unspecified abnormalities of gait and mobility: Secondary | ICD-10-CM

## 2022-01-31 NOTE — Therapy (Signed)
[facility] [address] [place], [place], [address] Phone: [phone]   Fax:  [phone]  Physical Therapy Evaluation  Patient Details  Name: Casey [facility] MRN: [id number] Date of Birth: [birth year] Referring Provider (PT): Dr. Emily [facility]   Encounter Date: 01/31/2022   PT End of Session - 02/01/22 1213     Visit Number 1    Number of Visits 25    Date for PT Re-Evaluation 04/25/22    Authorization Time Period initial cert 05/30/7208- 03/01/961    Progress Note Due on Visit 10    PT Start Time 1000    PT Stop Time 1059    PT Time Calculation (min) 59 min    Equipment Utilized During Treatment Gait belt    Activity Tolerance Patient tolerated treatment well    Behavior During Therapy [facility] for tasks assessed/performed             Past Medical History:  Diagnosis Date   Allergic rhinitis    Arthritis    Cancer ([place])    Bladder Cancer   Cancer [facility])    Prostate Cancer   Diabetes mellitus without complication ([place])    Diverticulosis    Dysphagia    GERD (gastroesophageal reflux disease)    History of kidney stones    h/o   Hypertension    Stroke ([place]) 2006   possible light stroke-no deficits    Past Surgical History:  Procedure Laterality Date   CYSTOSCOPY W/ RETROGRADES Bilateral 06/07/2019   Procedure: CYSTOSCOPY WITH RETROGRADE PYELOGRAM;  Surgeon: Hollice Espy, MD;  Location: [facility] OR[facility];  Service: Urology;  Laterality: Bilateral;   CYSTOSCOPY WITH BIOPSY N/A 06/07/2019   Procedure: CYSTOSCOPY WITH Bladder BIOPSY;  Surgeon: Hollice Espy, MD;  Location: [facility];  Service: Urology;  Laterality: N/A;   ESOPHAGOGASTRODUODENOSCOPY N/A 04/21/2015   Procedure: ESOPHAGOGASTRODUODENOSCOPY (EGD);  Surgeon: Hulen Luster, MD;  Location[facility];  Service: Gastroenterology;  Laterality: N/A;   LITHOTRIPSY     SINUS SURGERY WITH INSTATRAK  2008   STAPEDES SURGERY Left age 89   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    Patient is accompained by: Family member    Pertinent History Casey [facility] is an 88 y.o. male referred by his PCP Dr. Emily [facility] for weakness. Pt was admitted on 10/10/21- 10/11/21 for CVA. 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 Unable to obtain MRI due to ear implant. Hx of esophageal dysphagia requiring dilation in the past; reports 1 year hx of worsening dysphagia prior to CVA.    How long can you sit comfortably? No issues    How long can you stand comfortably? Not limited as long as holding onto something    How long can you walk comfortably? < 15 min    Patient Stated Goals Walk without losing my balance and no falls    Currently in  Pain? No/denies               OBJECTIVE  Musculoskeletal Tremor: Absent Bulk: Normal Tone: Normal, no clonus   Posture Forward head and shoulders   Gait Decreased step length, forward trunk lean- using RW, Difficulty with turning with unsteadiness.   Strength R/L 4/4  throughout with all the following: Hip flexion Hip external rotation  Hip internal rotation  Hip extension   Hip abduction  Hip adduction  Knee extension  Knee flexion Ankle Plantarflexion  Ankle Dorsiflexion   NEUROLOGICAL:   Sensation Grossly intact to light touch bilateral UEs/LEs as determined by testing dermatomes C2-T2/L2-S2 respectively. Proprioception and hot/cold testing deferred on this date    Coordination/Cerebellar Finger to Nose: WNL   FUNCTIONAL OUTCOME  MEASURES   Results Comments  BERG 36/56 Fall risk, in need of intervention  FOTO 60       TUG 31.44 seconds Using RW  5TSTS 23.3 seconds With B UE Support      10 Meter Gait Speed Self-selected: 16.33s = 0.19ms Below normative values for full community ambulation              ASSESSMENT Clinical Impression:  Pt is a pleasant 87year-old male referred with diagnosis of weakness.  PT examination reveals deficits . Pt presents with deficits including B LE weakness,impaired  gait and difficulty with balance. Patient will benefit from skilled PT services to address these deficits, improve balance, and decrease risk for future falls.                  [facility] 01[facility] 1016       Assessment   Medical Diagnosis Weakness    Referring Provider (PT) Dr. MEmily [facility]   Hand Dominance Right    Next MD Visit 02/14/2022- Dr. SManuella Ghazi   Prior Therapy None      Precautions   Precautions Fall      Restrictions   Weight Bearing Restrictions No      Balance Screen   Has the patient fallen in the past 6 months Yes    How many times? 1    Has the patient had a decrease in activity [facility] because of a fear of falling?  Yes    Is the patient reluctant to leave their home because of a fear of falling?  Yes      HAlgerPrivate Adkins    Living Arrangements Spouse/significant other    Available Help at Discharge Family    Type of HLoxleyto Adkins    Entrance Stairs-Number of Steps 4    Entrance Stairs-Rails Can reach both    [place] [facility]kins    HFort Adkins- single point;Walker - 2 wheels;Shower seat      Prior Function   [place] Independent    Vocation Retired      [place]      Standardized Balance Assessment   Standardized Balance Assessment BChief Technology OfficerTest   Sit to Stand Able to stand using hands after several tries     Standing Unsupported Able to stand 2 minutes with supervision    Sitting with Back Unsupported but Feet Supported on Floor or Stool Able to sit safely and securely 2 minutes    Stand to Sit Uses backs of legs against chair  to control descent    Transfers Able to transfer safely, definite need of hands    Standing Unsupported with Eyes Closed Able to stand 10 seconds with supervision    Standing Unsupported with Feet Together Able to place feet together independently and stand for 1 minute with supervision    From Standing, Reach Forward with Outstretched Arm Can reach forward >12 cm safely (5")    From Standing Position, Pick up Object from Floor Able to pick up shoe, needs supervision    From Standing Position, Turn to Look Behind Over each Shoulder Turn sideways only but maintains balance    Turn 360 Degrees Able to turn 360 degrees safely but slowly    Standing Unsupported, Alternately Place Feet on Step/Stool Able to stand independently and complete 8 steps >20 seconds    Standing Unsupported, One Foot in Front Able to take small step independently and hold 30 seconds    Standing on One Leg Tries to lift leg/unable to hold 3 seconds but remains standing independently    Total Score 36                        Objective measurements completed on examination: See above findings.                PT Education - 02/01/22 1212     Education Details purpose of PT and functional outcome measures    Person(s) Educated Patient;Spouse    Methods Explanation;Demonstration;Tactile cues;Verbal cues    Comprehension Verbalized understanding;Returned demonstration;Verbal cues required;Tactile cues required;Need further instruction              PT Short Term Goals - 02/01/22 1215       PT SHORT TERM GOAL #1   Title Pt will be independent with initial HEP in order to improve strength and balance in order to decrease fall risk and improve function at home and work.     Baseline Eval= No formal HEP in place    Time 6    Period Weeks    Status New    Target Date 03/14/22               PT Long Term Goals - 02/01/22 1215       PT LONG TERM GOAL #1   Title Pt will be independent with final HEP in order to improve strength and balance in order to decrease fall risk and improve function at home and work.    Baseline Eval= No formal HEP in place    Time 12    Period Weeks    Status New    Target Date 04/25/22      PT LONG TERM GOAL #2   Title Pt will improve FOTO to target score of to display perceived improvements in ability to complete ADL's.    Baseline Eval= 60    Time 12    Period Weeks    Status New    Target Date 04/25/22      PT LONG TERM GOAL #3   Title Pt will improve BERG by at least 3 points in order to demonstrate clinically significant improvement in balance.    Baseline Eval= 36/56    Time 12    Period Weeks    Status New    Target Date 04/25/22      PT LONG TERM GOAL #4   Title Pt will decrease 5TSTS by at least 3 seconds in order to demonstrate  clinically significant improvement in LE strength.    Baseline Eval= 23.3 sec with BUE Support    Time 12    Period Weeks    Status New    Target Date 04/25/22      PT LONG TERM GOAL #5   Title Pt will decrease TUG to below 20 seconds/decrease in order to demonstrate decreased fall risk.    Baseline Eval= 31.44 with use of RW    Time 12    Period Weeks    Status New    Target Date 04/25/22      Additional Long Term Goals   Additional Long Term Goals Yes      PT LONG TERM GOAL #6   Title Pt will increase 10MWT by at least 0.13 m/s in order to demonstrate clinically significant improvement in community ambulation.    Baseline Eval= 0.62 m/s using RW    Time 12    Period Weeks    Status New    Target Date 04/25/22                    Plan - 01/31/22 1456     Clinical Impression Statement Pt is a pleasant 88 year-old male referred with diagnosis of  weakness.  PT examination reveals deficits . Pt presents with deficits including B LE weakness,impaired  gait and difficulty with balance. Patient will benefit from skilled PT services to address these deficits, improve balance, and decrease risk for future falls.    Personal Factors and Comorbidities Age;Comorbidity 3+    Comorbidities Diabetes, CVA, HTN    Examination-Activity Limitations Caring for Others;Lift;Squat;Stairs;Transfers    Examination-Participation Restrictions Cleaning;Community Activity;Yard Work    Merchant navy officer Evolving/Moderate complexity    Clinical Decision Making Moderate    Rehab Potential Good    PT Frequency 2x / week    PT Duration 12 weeks    PT Treatment/Interventions ADLs/Self Care Home Management;Canalith Repostioning;Cryotherapy;Electrical Stimulation;Moist Heat;Traction;Ultrasound;DME Instruction;Gait training;Stair training;Functional mobility training;Therapeutic activities;Therapeutic exercise;Balance training;Neuromuscular re-education;Patient/family education;Manual techniques;Passive range of motion;Dry needling;Vestibular    PT Next Visit Plan Instruction in sit to stand, LE Strengthening, Balance interventions.    PT Home Exercise Plan To be initiated next 1-2 sessions.    Consulted and Agree with Plan of Care Patient             Patient will benefit from skilled therapeutic intervention in order to improve the following deficits and impairments:  Abnormal gait, Decreased activity tolerance, Decreased balance, Decreased coordination, Decreased endurance, Decreased mobility, Decreased range of motion, Decreased strength, Difficulty walking, Hypomobility, Impaired flexibility  Visit Diagnosis: Muscle weakness (generalized)  Abnormality of gait and mobility  Difficulty in walking, not elsewhere classified  Unsteadiness on feet     Problem List Patient Active Problem List   Diagnosis Date Noted   HLD (hyperlipidemia)  01/21/2022   Essential tremor 01/21/2022   Stroke-like episode    Overweight (BMI 25.0-29.9) 10/11/2021   Hypertriglyceridemia 10/11/2021   Stroke-like symptom 10/10/2021   Diabetes mellitus without complication ([place]) 16/08/9603   HTN (hypertension) 10/10/2021   Pyuria 12/25/2016   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   Medicare annual wellness visit, initial 07/17/2016   B12 deficiency 07/17/2016   History of CVA (cerebrovascular accident) without residual deficits 05/14/2016   PIN III (prostatic intraepithelial neoplasia III) 04/11/2015   Personal history of other malignant neoplasm of skin 09/16/2014   History of nonmelanoma skin cancer 09/16/2014   History of colonic polyps 08/23/2014   Diverticulosis of large intestine  08/23/2014   Benign hematuria 07/20/2014   Type 2 diabetes mellitus ([place]) 07/04/2014   Malignant neoplasm of bladder ([place]) 07/04/2014   DM (diabetes mellitus) type II controlled with renal manifestation ([place]) 07/04/2014   Benign essential hypertension 07/04/2014   Urge incontinence 12/29/2013   Malignant neoplasm of lateral wall of urinary bladder ([place]) 09/09/2012   Incomplete emptying of bladder 09/09/2012   Enlarged prostate with lower urinary tract symptoms (LUTS) 09/09/2012   Elevated prostate specific antigen (PSA) 09/09/2012   Chronic prostatitis 09/09/2012   Carcinoma in situ of prostate 09/09/2012   Kidney stone 09/09/2012   Benign prostatic hyperplasia with urinary obstruction 09/09/2012    Lewis Moccasin, PT 02/01/2022, 12:23 PM  [place] [facility] [address] [place], [place], [address] Phone: [phone]   Fax:  [phone]  Name: Casey [facility] MRN: [id number] Date of Birth: [birth year]

## 2022-02-01 NOTE — Therapy (Signed)
[facility] MAIN [facility] SERVICES [address] [place], [place], [address] Phone: [phone]   Fax:  [phone]  Occupational Therapy Evaluation  Patient Details  Name: Casey Adkins MRN: [id number] Date of Birth: 10-24-1934 Referring Provider (OT): Dr. Bethann Punches, PCP   Encounter Date: 01/31/2022   OT End of Session - 02/01/22 1510     Visit Number 1    Number of Visits 12    Date for OT Re-Evaluation 03/13/22    OT Start Time 1100    OT Stop Time 1200    OT Time Calculation (min) 60 min    Activity Tolerance Patient tolerated treatment well    Behavior During Therapy [facility] for tasks assessed/performed             Past Medical History:  Diagnosis Date   Allergic rhinitis    Arthritis    Cancer (HCC)    Bladder Cancer   Cancer [facility])    Prostate Cancer   Diabetes mellitus without complication (HCC)    Diverticulosis    Dysphagia    GERD (gastroesophageal reflux disease)    History of kidney stones    h/o   Hypertension    Stroke [facility]) 2006   possible light stroke-no deficits    Past Surgical History:  Procedure Laterality Date   CYSTOSCOPY W/ RETROGRADES Bilateral 06/07/2019   Procedure: CYSTOSCOPY WITH RETROGRADE PYELOGRAM;  Surgeon: Vanna Scotland, MD;  Location: [facility] ORS;  Service: Urology;  Laterality: Bilateral;   CYSTOSCOPY WITH BIOPSY N/A 06/07/2019   Procedure: CYSTOSCOPY WITH Bladder BIOPSY;  Surgeon: Vanna Scotland, MD;  Location: [facility] ORS;  Service: Urology;  Laterality: N/A;   ESOPHAGOGASTRODUODENOSCOPY N/A 04/21/2015   Procedure: ESOPHAGOGASTRODUODENOSCOPY (EGD);  Surgeon: Wallace Cullens, MD;  Location: [facility] ENDOSCOPY;  Service: Gastroenterology;  Laterality: N/A;   LITHOTRIPSY     SINUS SURGERY WITH INSTATRAK  2008   STAPEDES SURGERY Left age 69   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  Unable to confirm another stroke via MRI d/t ear implant.    Limitations R sided weakness, lack of coordination, impaired balance, slurred speech    Patient Stated Goals To get back to bowling.    Currently in Pain? No/denies    Pain Score 0-No pain    Multiple Pain Sites No               [facility] OT Assessment - 02/01/22 0001       Assessment   Medical Diagnosis CVA    Referring Provider (OT) Dr. Bethann Punches, PCP    Onset Date/Surgical Date 10/10/21    Hand Dominance Right    Next MD Visit Saw Dr. Bradly Chris, will go back May 24th    Prior Therapy none      Precautions   Precautions Fall      Restrictions   Weight Bearing Restrictions No      Balance  Screen   Has the patient fallen in the past 6 months Yes    How many times? 1      Home  Environment   Family/patient expects to be discharged to: Private residence    Living Arrangements Spouse/significant other    Available Help at Discharge Family    Type of Home House    Home Access Stairs    Home Layout One level    Alternate Level Stairs - Number of Steps 3-4 with rail    Engineer, manufacturing Toilet Handicapped height    Bathroom Accessibility Yes    Home Equipment [place] - 2 wheels;Shower seat;Grab bars - tub/shower;[place] - single point      Prior Function   Level of Independence Independent    Vocation Retired    [employer] retired from IT consultant, 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  Pt requires extra time to engage RUE into self care tasks d/t mild motor apraxia      IADL   Medication Management Is not capable of dispensing or managing own medication   spouse supervises medication set up   Financial Management Requires assistance      Mobility   Mobility Status Comments prior to stroke pt used walking stick in 1 hand to go out on gravel drive      Written Expression   Dominant Hand Right    Handwriting 75% legible;Increased time   spouse reports printed name and signature are comparable to pre-stroke; pt has mild hand tremor that was present at baseline, contributing to decreased legibility     Vision - History   Baseline Vision Wears glasses all the time    Visual History Macular degeneration      Vision Assessment   Tracking/Visual Pursuits Able to track stimulus in all quads without difficulty    Saccades Within functional limits    Visual Fields No apparent deficits      Observation/Other Assessments   Focus on Therapeutic Outcomes (FOTO)  60      Sensation   Light Touch Appears Intact      Coordination   Gross Motor Movements are Fluid and Coordinated Yes    Fine Motor Movements are Fluid and Coordinated No    Right 9 Hole Peg Test 1 min 7 sec    Left 9 Hole Peg Test 39 sec      AROM   Overall AROM Comments BUEs WNL      Strength   Overall Strength Comments BUEs 5/5      Hand Function   Right Hand Grip (lbs) 56    Right Hand Lateral Pinch 18 lbs    Right Hand 3 Point Pinch 20 lbs    Left Hand Grip (lbs) 54    Left Hand Lateral Pinch 16 lbs    Left 3 point pinch 22 lbs            Occupational Therapy Evaluation: Pt is an 88 y/o male s/p L CVA in Nov. 2022.  New episode of dizziness and decreased balance in Feb, but  unable to confirm new CVA with  MRI d/t ear implant.  Pt presents today with spouse, acknowledging clumsiness in R hand and balance deficits.  Pt was an avid bowler prior to CVA and was able to amb without AD.  Pt currently using RW and spouse supervises all functional mobility.  Pt reports clumsiness with using eating utensils, cutting food, picking up pills.  Pt will benefit from skilled OT for focus on R hand coordination skills and increasing dynamic standing tasks in order to reduce fall risk and work towards return to PLOF, specifically improving accuracy and efficiency when engaging R hand into daily tasks.     Neuro re-ed: Instructed pt in coin manipulation activities for R hand with coin pick up from table, storage of coins within palm of hand, transferring coins from palm of hand to fingertips to enable discarding coins one at a time without dropping; moderate difficulty, extra time, but able to return demo.  Encouraged daily completion at home for increasing R hand dexterity.  Pt verbalized understanding.      OT Education - 02/01/22 1510     Education Details Role of OT, goals, poc    Person(s) Educated Patient;Spouse    Methods Explanation;Verbal cues    Comprehension Verbalized understanding;Need further instruction              OT Short Term Goals - 02/01/22 1514       OT SHORT TERM GOAL #1   Title Pt will perform HEP for increasing hand coordination with min vc.    Baseline Eval: initiated HEP at eval, further training needed    Time 3    Period Weeks    Status New    Target Date 02/20/22               OT Long Term Goals - 02/01/22 1514       OT LONG TERM GOAL #1   Title Pt will increase FOTO score by 2 or more points to indicate increased functional performance.    Baseline Eval: FOTO 60    Time 6    Period Weeks    Status New    Target Date 03/13/22      OT LONG TERM GOAL #2   Title Pt will increase R hand coordination to manage eating utensils with  good control.    Baseline Eval: Pt reports being clumsy with utensils    Time 6    Period Weeks    Status New    Target Date 03/13/22      OT LONG TERM GOAL #3   Title Pt will increase R hand dexterity for better manipulation of ADL supplies as noted by <60 sec to complete 9 hole peg test.    Baseline Eval: R 1 min 7 sec, L 39 sec    Time 6    Period Weeks    Status New    Target Date 03/13/22      OT LONG TERM GOAL #4   Title Pt will perform bathroom transfers with modified indep-indep.    Baseline Eval: spouse supervises all functional transfers; pt using RW for support.    Time 6    Period Weeks    Status New    Target Date 03/13/22      OT LONG TERM GOAL #5   Title Pt will increase dynamic standing balance to enable pt to roll a light weight (5lbs or less) ball independently to work towards return to bowling.    Baseline Eval: unable to participate  in bowling (pt's primary leisure activity)    Time 6    Period Weeks    Status New    Target Date 03/13/22                   Plan - 02/01/22 1511     Clinical Impression Statement Pt is an 88 y/o male s/p L CVA in Nov. 2022.  New episode of dizziness and decreased balance in Feb, but unable to confirm new CVA with MRI d/t ear implant.  Pt presents today with spouse, acknowledging clumsiness in R hand and balance deficits.  Pt was an avid bowler prior to CVA and was able to amb without AD.  Pt currently using RW and spouse supervises all functional mobility.  Pt reports clumsiness with using eating utensils, cutting food, picking up pills.  Pt will benefit from skilled OT for focus on R hand coordination skills and increasing dynamic standing tasks in order to reduce fall risk and work towards return to PLOF, specifically improving accuracy and efficiency when engaging R hand into daily tasks.    OT Occupational Profile and History Problem Focused Assessment - Including review of records relating to presenting problem     Occupational performance deficits (Please refer to evaluation for details): ADL's;IADL's;Leisure    Body Structure / Function / Physical Skills ADL;Coordination;GMC;UE functional use;Balance;IADL;Dexterity;FMC;Mobility;Gait    Rehab Potential Good    Clinical Decision Making Limited treatment options, no task modification necessary    Comorbidities Affecting Occupational Performance: May have comorbidities impacting occupational performance    Modification or Assistance to Complete Evaluation  No modification of tasks or assist necessary to complete eval    OT Frequency 2x / week    OT Duration 6 weeks    OT Treatment/Interventions Self-care/ADL training;Therapeutic exercise;DME and/or AE instruction;Functional Mobility Training;Balance training;Neuromuscular education;Therapeutic activities;Patient/family education    OT Home Exercise Plan initiated hand coordination exercises at eval    Consulted and Agree with Plan of Care Family member/caregiver;Patient    Family Member Consulted spouse, Drenda Freeze             Patient will benefit from skilled therapeutic intervention in order to improve the following deficits and impairments:   Body Structure / Function / Physical Skills: ADL, Coordination, GMC, UE functional use, Balance, IADL, Dexterity, FMC, Mobility, Gait       Visit Diagnosis: Other lack of coordination  Unsteadiness on feet    Problem List Patient Active Problem List   Diagnosis Date Noted   HLD (hyperlipidemia) 01/21/2022   Essential tremor 01/21/2022   Stroke-like episode    Overweight (BMI 25.0-29.9) 10/11/2021   Hypertriglyceridemia 10/11/2021   Stroke-like symptom 10/10/2021   Diabetes mellitus without complication (HCC) 10/10/2021   HTN (hypertension) 10/10/2021   Pyuria 12/25/2016   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   Medicare annual wellness visit, initial 07/17/2016   B12 deficiency 07/17/2016   History of CVA (cerebrovascular accident) without residual  deficits 05/14/2016   PIN III (prostatic intraepithelial neoplasia III) 04/11/2015   Personal history of other malignant neoplasm of skin 09/16/2014   History of nonmelanoma skin cancer 09/16/2014   History of colonic polyps 08/23/2014   Diverticulosis of large intestine 08/23/2014   Benign hematuria 07/20/2014   Type 2 diabetes mellitus (HCC) 07/04/2014   Malignant neoplasm of bladder (HCC) 07/04/2014   DM (diabetes mellitus) type II controlled with renal manifestation (HCC) 07/04/2014   Benign essential hypertension 07/04/2014   Urge incontinence 12/29/2013   Malignant neoplasm of lateral wall  of urinary bladder (HCC) 09/09/2012   Incomplete emptying of bladder 09/09/2012   Enlarged prostate with lower urinary tract symptoms (LUTS) 09/09/2012   Elevated prostate specific antigen (PSA) 09/09/2012   Chronic prostatitis 09/09/2012   Carcinoma in situ of prostate 09/09/2012   Kidney stone 09/09/2012   Benign prostatic hyperplasia with urinary obstruction 09/09/2012   Danelle Earthly, MS, OTR/L  Otis Dials, OT 02/01/2022, 3:38 PM  [facility] MAIN [facility] SERVICES [address] [place], [place], [address] Phone: [phone]   Fax:  [phone]  Name: Casey Adkins MRN: [id number] Date of Birth: 29-Sep-1934

## 2022-02-04 ENCOUNTER — Other Ambulatory Visit: Payer: Self-pay

## 2022-02-04 ENCOUNTER — Ambulatory Visit: Payer: Medicare Other | Admitting: Speech Pathology

## 2022-02-04 ENCOUNTER — Ambulatory Visit: Payer: Medicare Other | Admitting: Occupational Therapy

## 2022-02-04 DIAGNOSIS — R471 Dysarthria and anarthria: Secondary | ICD-10-CM

## 2022-02-04 DIAGNOSIS — R278 Other lack of coordination: Secondary | ICD-10-CM

## 2022-02-04 DIAGNOSIS — M6281 Muscle weakness (generalized): Secondary | ICD-10-CM

## 2022-02-04 NOTE — Therapy (Signed)
[place] MAIN [facility] SERVICES [address] [place], [place], [address] Phone: [phone]   Fax:  [phone]  Speech Language Pathology Treatment  Patient Details  Name: Casey Adkins MRN: [id number] Date of Birth: 08/08/1934 Referring Provider (SLP): Rusty Aus, MD   Encounter Date: 02/04/2022   End of Session - 02/04/22 1257     Visit Number 15    Number of Visits 37    Date for SLP Re-Evaluation 04/28/22    SLP Start Time 1000    SLP Stop Time  1100    SLP Time Calculation (min) 60 min    Activity Tolerance Patient tolerated treatment well             Past Medical History:  Diagnosis Date   Allergic rhinitis    Arthritis    Cancer ([place])    Bladder Cancer   Cancer [facility])    Prostate Cancer   Diabetes mellitus without complication ([place])    Diverticulosis    Dysphagia    GERD (gastroesophageal reflux disease)    History of kidney stones    h/o   Hypertension    Stroke [facility] 2006   possible light stroke-no deficits    Past Surgical History:  Procedure Laterality Date   CYSTOSCOPY W/ RETROGRADES Bilateral 06/07/2019   Procedure: CYSTOSCOPY WITH RETROGRADE PYELOGRAM;  Surgeon: Hollice Espy, MD;  Location: [facility] ORS;  Service: Urology;  Laterality: Bilateral;   CYSTOSCOPY WITH BIOPSY N/A 06/07/2019   Procedure: CYSTOSCOPY WITH Bladder BIOPSY;  Surgeon: Hollice Espy, MD;  Location: [facility] ORS;  Service: Urology;  Laterality: N/A;   ESOPHAGOGASTRODUODENOSCOPY N/A 04/21/2015   Procedure: ESOPHAGOGASTRODUODENOSCOPY (EGD);  Surgeon: Hulen Luster, MD;  Location: [facility] ENDOSCOPY;  Service: Gastroenterology;  Laterality: N/A;   LITHOTRIPSY     SINUS SURGERY WITH INSTATRAK  2008   STAPEDES SURGERY Left age 40   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   Subjective Assessment - 02/04/22 1246     Subjective Reports his OT, PT evaluations  went well.    Patient is accompained by: Family member   spouse Casey Adkins   Currently in Pain? No/denies                   ADULT SLP TREATMENT - 02/04/22 1247       General Information   Behavior/Cognition Alert;Cooperative;Pleasant mood    HPI Casey Adkins is an 88 y.o. male referred for MBS by his PCP Dr. Emily Filbert. Pt admitted 10/10/21- 10/11/21 for CVA. 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 Unable to obtain MRI due to ear implant. Hx of esophageal dysphagia requiring dilation in the past; reports 1 year hx of worsening dysphagia prior to CVA. Neurology noted right upper motor neuron pattern weakness, suspected small vessel lacunar infarction. 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 Wife assists with history given pt's dysarthria, aphasia.      Treatment Provided   Treatment provided Cognitive-Linquistic      Cognitive-Linquistic Treatment   Treatment focused on Dysarthria;Aphasia;Patient/family/caregiver education    Skilled Treatment Targeted dysarthria at word/phrase level using pt's personally relevant phrase list. Pt reported feeling "unnatural" and was noted to be over-exaggerating his pronunciation during this task. Used audio recording to create awareness and habituate a more natural  speech pattern while retaining use of SLOP strategies. Intelligibility increased from 70% (no strategies) to 95% with focus on louder speech, slower rate.  In sentence-level reading task (bowling terminology), patient intelligibility averaged 75%, improved to 85% with moderate cues, which remained necessary due to decreased awareness. Again used audio recording to provide feedback to pt and increase awareness. Noted difficulty with consonant blends; transitioned to focus on single syllables with word-initial /sk, /skr/, /spr/. Accuracy 95% with min cues. Progressed to  multisyllabic words embedded within phrases; intelligibility for targets remained at 95%, although overall intelligibility in this task 85%. Barrier task (p/b word-initial minimal pairs) for SLP selecting from f:4 pt's production 95% accuracy.      Assessment / Recommendations / Plan   Plan Continue with current plan of care      Progression Toward Goals   Progression toward goals Progressing toward goals              SLP Education - 02/04/22 1257     Education Details using strategies doesn't have to sound "unnatural"    Person(s) Educated Patient    Methods Explanation    Comprehension Verbalized understanding;Returned demonstration;Need further instruction              SLP Short Term Goals - 01/28/22 1014       SLP SHORT TERM GOAL #1   Title Pt will use aphasia compensations during 5-8 minutes conversation with occasional min cues.    Time 10    Period --   sessions   Status Achieved      SLP SHORT TERM GOAL #2   Title Pt will demonstrate dysarthria HEP with rare min cues.    Time 10    Period --   sessions   Status Achieved      SLP SHORT TERM GOAL #3   Title Patient will use compensations for dysarthria in sentence responses for >95% intelligibility.    Time 10    Period --   sessions   Status Achieved      SLP SHORT TERM GOAL #4   Title Pt will complete further assessment of cognitive-communication PRN.    Time 10    Period --   sessions   Status Achieved      SLP SHORT TERM GOAL #5   Title Patient will use compensations for dysarthria in sentence-level reading for >95% intelligibility.    Time 10    Period --   sessions   Status Revised   revised after worsening symptoms, possible new CVA on 01/21/22     SLP SHORT TERM GOAL #6   Title Patient will demonstrate alternating attention over 5 minutes between 2 simple cognitive-linguistic task with 90% accuracy.    Time 10    Period --   sessions   Status Revised   revised after worsening symptoms,  possible new CVA on 01/21/22             SLP Long Term Goals - 01/28/22 1016       SLP LONG TERM GOAL #1   Title Pt will use aphasia compensations effectively during 15 minutes mod complex conversation with modified independence.    Time 12    Period Weeks    Status Revised   revised after worsening symptoms, possible new CVA on 01/21/22     SLP LONG TERM GOAL #2   Title Pt will use aphasia compensations with modified independence to resolve communication breakdown at least 3 occasions outside of [place],  per pt/spouse report.    Time 12    Period Weeks    Status On-going      SLP LONG TERM GOAL #3   Title Patient will use compensations for dysarthria in 15 minute conversation for >95% intelligibility with no more than 2 repetitions necessary.    Time 12    Period Weeks    Status Revised   revised after worsening symptoms, possible new CVA on 01/21/22     SLP LONG TERM GOAL #4   Title Patient will demonstrate swallowing precautions independently x3 sessions with no overt s/sx aspiration.    Time 12    Period Weeks    Status On-going              Plan - 02/04/22 1257     Clinical Impression Statement Patient continues with moderate dysarthria, mild aphasia, and cognitive communication deficits, which appear slightly worsened from baseline since hospitalization 01/21/22 for new stroke-like symptoms. Pt benefitted from auditory feedback for awareness of errors as well as awareness of improved intelligibility when using compensations. Benefitted from focus on consonant blends with gradually increasing task complexity. Continue to reinforce use of dysathria compensations to improve speech intelligibility. Pt/spouse deny changes in swallow function. I recommend continued skilled ST to improve intelligibility, verbal expression and swallowing safety necessary for increased communicatory success and independence in the home and community environments.    Speech Therapy Frequency 2x / week     Duration 12 weeks    Treatment/Interventions Aspiration precaution training;Environmental controls;Language facilitation;Cueing hierarchy;Oral motor exercises;SLP instruction and feedback;Pharyngeal strengthening exercises;Compensatory techniques;Cognitive reorganization;Functional tasks;Compensatory strategies;Diet toleration management by SLP;Internal/external aids;Multimodal communcation approach;Patient/family education    Potential to Achieve Goals Good    Consulted and Agree with Plan of Care Patient;Family member/caregiver             Patient will benefit from skilled therapeutic intervention in order to improve the following deficits and impairments:   Dysarthria and anarthria    Problem List Patient Active Problem List   Diagnosis Date Noted   HLD (hyperlipidemia) 01/21/2022   Essential tremor 01/21/2022   Stroke-like episode    Overweight (BMI 25.0-29.9) 10/11/2021   Hypertriglyceridemia 10/11/2021   Stroke-like symptom 10/10/2021   Diabetes mellitus without complication ([place]) 11/55/2080   HTN (hypertension) 10/10/2021   Pyuria 12/25/2016   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   Medicare annual wellness visit, initial 07/17/2016   B12 deficiency 07/17/2016   History of CVA (cerebrovascular accident) without residual deficits 05/14/2016   PIN III (prostatic intraepithelial neoplasia III) 04/11/2015   Personal history of other malignant neoplasm of skin 09/16/2014   History of nonmelanoma skin cancer 09/16/2014   History of colonic polyps 08/23/2014   Diverticulosis of large intestine 08/23/2014   Benign hematuria 07/20/2014   Type 2 diabetes mellitus ([place]) 07/04/2014   Malignant neoplasm of bladder ([place]) 07/04/2014   DM (diabetes mellitus) type II controlled with renal manifestation ([place]) 07/04/2014   Benign essential hypertension 07/04/2014   Urge incontinence 12/29/2013   Malignant neoplasm of lateral wall of urinary bladder ([place]) 09/09/2012   Incomplete  emptying of bladder 09/09/2012   Enlarged prostate with lower urinary tract symptoms (LUTS) 09/09/2012   Elevated prostate specific antigen (PSA) 09/09/2012   Chronic prostatitis 09/09/2012   Carcinoma in situ of prostate 09/09/2012   Kidney stone 09/09/2012   Benign prostatic hyperplasia with urinary obstruction 09/09/2012   Deneise Lever, MS, CCC-SLP Speech-Language Pathologist ([place], [place] 02/04/2022, 1:00 PM  [place]  [facility] MAIN [facility] SERVICES [address] [place], [place], [address] Phone: [phone]   Fax:  [phone]   Name: Casey Adkins MRN: [id number] Date of Birth: [birth year]

## 2022-02-06 ENCOUNTER — Encounter: Payer: Self-pay | Admitting: Occupational Therapy

## 2022-02-06 ENCOUNTER — Ambulatory Visit: Payer: Medicare Other | Admitting: Occupational Therapy

## 2022-02-06 ENCOUNTER — Ambulatory Visit: Payer: Medicare Other | Admitting: Speech Pathology

## 2022-02-06 ENCOUNTER — Ambulatory Visit: Payer: Medicare Other

## 2022-02-06 ENCOUNTER — Other Ambulatory Visit: Payer: Self-pay

## 2022-02-06 DIAGNOSIS — R2681 Unsteadiness on feet: Secondary | ICD-10-CM

## 2022-02-06 DIAGNOSIS — R262 Difficulty in walking, not elsewhere classified: Secondary | ICD-10-CM

## 2022-02-06 DIAGNOSIS — R278 Other lack of coordination: Secondary | ICD-10-CM

## 2022-02-06 DIAGNOSIS — R269 Unspecified abnormalities of gait and mobility: Secondary | ICD-10-CM

## 2022-02-06 DIAGNOSIS — R471 Dysarthria and anarthria: Secondary | ICD-10-CM | POA: Diagnosis not present

## 2022-02-06 DIAGNOSIS — R4701 Aphasia: Secondary | ICD-10-CM

## 2022-02-06 DIAGNOSIS — M6281 Muscle weakness (generalized): Secondary | ICD-10-CM

## 2022-02-06 NOTE — Therapy (Signed)
[place] [facility] ?[place], [place], [address] ?Phone: [phone]   Fax:  [phone] ? ?Physical Therapy Treatment ? ?Patient Details  ?Name: Casey Adkins ?MRN: [id number] ?Date of Birth: Feb 15, 1934 ?Referring Provider (PT): Dr. Emily Filbert ? ? ?Encounter Date: 02/06/2022 ? ? PT End of Session - 02/06/22 0758   ? ? Visit Number 2   ? Number of Visits 25   ? Date for PT Re-Evaluation 04/25/22   ? Authorization Time Period initial cert 04/25/6072- 05/25/625   ? Progress Note Due on Visit 10   ? PT Start Time 0800   ? PT Stop Time 0840   ? PT Time Calculation (min) 40 min   ? Equipment Utilized During Treatment Gait belt   ? Activity Tolerance Patient tolerated treatment well   ? Behavior During Therapy [facility] for tasks assessed/performed   ? ?  ?  ? ?  ? ? ?Past Medical History:  ?Diagnosis Date  ? Allergic rhinitis   ? Arthritis   ? Cancer [facility])   ? Bladder Cancer  ? Cancer [facility])   ? Prostate Cancer  ? Diabetes mellitus without complication ([place])   ? Diverticulosis   ? Dysphagia   ? GERD (gastroesophageal reflux disease)   ? History of kidney stones   ? h/o  ? Hypertension   ? Stroke [facility]) 2006  ? possible light stroke-no deficits  ? ? ?Past Surgical History:  ?Procedure Laterality Date  ? CYSTOSCOPY W/ RETROGRADES Bilateral 06/07/2019  ? Procedure: CYSTOSCOPY WITH RETROGRADE PYELOGRAM;  Surgeon: Hollice Espy, MD;  Location: [facility] ORS;  Service: Urology;  Laterality: Bilateral;  ? CYSTOSCOPY WITH BIOPSY N/A 06/07/2019  ? Procedure: CYSTOSCOPY WITH Bladder BIOPSY;  Surgeon: Hollice Espy, MD;  Location: [facility] ORS;  Service: Urology;  Laterality: N/A;  ? ESOPHAGOGASTRODUODENOSCOPY N/A 04/21/2015  ? Procedure: ESOPHAGOGASTRODUODENOSCOPY (EGD);  Surgeon: Hulen Luster, MD;  Location: [facility] ENDOSCOPY;  Service: Gastroenterology;  Laterality: N/A;  ? LITHOTRIPSY    ? SINUS SURGERY WITH INSTATRAK  2008  ? STAPEDES SURGERY Left age 59  ? 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 TONSILLECTOMY    ? ? ?There were no vitals filed for this visit. ? ? Subjective Assessment - 02/06/22 0758   ? ? Subjective Patient reports has been some better but still off balance and using cane for mobility.   ? Patient is accompained by: Family member   ? Pertinent History Casey Adkins is an 88 y.o. male referred by his PCP Dr. Emily Filbert for weakness. Pt was admitted on 10/10/21- 10/11/21 for CVA. 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 Unable to obtain MRI due to ear implant. Hx of esophageal dysphagia requiring dilation in the past; reports 1 year hx of worsening dysphagia prior to CVA.   ? How long can you sit comfortably? No issues   ? How long can you stand comfortably? Not limited as long as holding onto something   ? How long can you walk comfortably? < 15 min   ? Patient Stated Goals Walk without losing my balance and no falls   ? Currently in Pain? No/denies   ? ?  ?  ? ?  ? ? ?INTERVENTIONS:  ? ?Therapeutic Exercises:  ? ?Instructed in standing LE strengthening exercises for HEP today:  ? ?Stand hip march ?Stand hip ext ?Stand hip  abd ?Stand knee flex ?Stand minisquat ?Stand calf raises ?Stand toe raises ? ?Patient performed 2 sets of 10 reps total BLE. Education provided throughout session via VC/TC and demonstration to facilitate movement at target joints and correct muscle activation for all testing and exercises performed.  ? ?Access Code: [email or web address] ?URL: [email or web address] ?Date: 02/06/2022 ?Prepared by: Sande Brothers ? ?Exercises ?Standing March with Counter Support - 1 x daily - 3 x weekly - 3 sets - 10 reps ?Standing Hip Abduction with Unilateral Counter Support - 1 x daily - 3 x weekly - 3 sets - 10 reps ?Standing Hip Extension with Counter Support - 1 x daily - 3 x weekly - 3 sets - 10 reps ?Standing Knee Flexion with Unilateral Counter Support - 1 x daily  - 3 x weekly - 3 sets - 10 reps ?Mini Squat with Counter Support - 1 x daily - 7 x weekly - 3 sets - 10 reps ?Standing Heel Raise with Toes Turned Out - 1 x daily - 3 x weekly - 3 sets - 10 reps ? ? ? ? ? ? ? ? ? ? ? ? ? ? ? ? ? ? ? ? ? ? ? PT Education - 02/06/22 0758   ? ? Education Details Exercise technique   ? Person(s) Educated Patient   ? Methods Explanation;Demonstration;Tactile cues;Verbal cues   ? Comprehension Verbalized understanding;Returned demonstration;Verbal cues required;Tactile cues required;Need further instruction   ? ?  ?  ? ?  ? ? ? PT Short Term Goals - 02/01/22 1215   ? ?  ? PT SHORT TERM GOAL #1  ? Title Pt will be independent with initial HEP in order to improve strength and balance in order to decrease fall risk and improve function at home and work.   ? Baseline Eval= No formal HEP in place   ? Time 6   ? Period Weeks   ? Status New   ? Target Date 03/14/22   ? ?  ?  ? ?  ? ? ? ? PT Long Term Goals - 02/01/22 1215   ? ?  ? PT LONG TERM GOAL #1  ? Title Pt will be independent with final HEP in order to improve strength and balance in order to decrease fall risk and improve function at home and work.   ? Baseline Eval= No formal HEP in place   ? Time 12   ? Period Weeks   ? Status New   ? Target Date 04/25/22   ?  ? PT LONG TERM GOAL #2  ? Title Pt will improve FOTO to target score of to display perceived improvements in ability to complete ADL's.   ? Baseline Eval= 60   ? Time 12   ? Period Weeks   ? Status New   ? Target Date 04/25/22   ?  ? PT LONG TERM GOAL #3  ? Title Pt will improve BERG by at least 3 points in order to demonstrate clinically significant improvement in balance.   ? Baseline Eval= 36/56   ? Time 12   ? Period Weeks   ? Status New   ? Target Date 04/25/22   ?  ? PT LONG TERM GOAL #4  ? Title Pt will decrease 5TSTS by at least 3 seconds in order to demonstrate clinically significant improvement in LE strength.   ? Baseline Eval= 23.3 sec with BUE Support   ? Time 12   ?  Period Weeks   ?  Status New   ? Target Date 04/25/22   ?  ? PT LONG TERM GOAL #5  ? Title Pt will decrease TUG to below 20 seconds/decrease in order to demonstrate decreased fall risk.   ? Baseline Eval= 31.44 with use of RW   ? Time 12   ? Period Weeks   ? Status New   ? Target Date 04/25/22   ?  ? Additional Long Term Goals  ? Additional Long Term Goals Yes   ?  ? PT LONG TERM GOAL #6  ? Title Pt will increase 10MWT by at least 0.13 m/s in order to demonstrate clinically significant improvement in community ambulation.   ? Baseline Eval= 0.62 m/s using RW   ? Time 12   ? Period Weeks   ? Status New   ? Target Date 04/25/22   ? ?  ?  ? ?  ? ? ? ? ? ? ? ? Plan - 02/06/22 0759   ? ? Clinical Impression Statement Patient arrived today with good motivation. He was instructed in LE strengthening exercises in standing position. The 1st set- PT instructed then printed out a HEP. On the 2nd set, he was able to follow the printed out HEP - utilizing pictures with some continued VC for correct technique. He denied any pain and no LOB at support bar today. He was able to follow handout and return demonstration well today. Patient will benefit from continued PT services to improve overall LE strength, balance, and safe mobility for decreased risk of falling and improved functional mobility and quality of life.   ? Personal Factors and Comorbidities Age;Comorbidity 3+   ? Comorbidities Diabetes, CVA, HTN   ? Examination-Activity Limitations Caring for Others;Lift;Squat;Stairs;Transfers   ? Examination-Participation Restrictions Cleaning;Community Activity;[place] [employer] Work   ? Stability/Clinical Decision Making Evolving/Moderate complexity   ? Rehab Potential Good   ? PT Frequency 2x / week   ? PT Duration 12 weeks   ? PT Treatment/Interventions ADLs/Self Care Home Management;Canalith Repostioning;Cryotherapy;Electrical Stimulation;Moist Heat;Traction;Ultrasound;DME Instruction;Gait training;Stair training;Functional mobility  training;Therapeutic activities;Therapeutic exercise;Balance training;Neuromuscular re-education;Patient/family education;Manual techniques;Passive range of motion;Dry needling;Vestibular   ? PT Next Visit P

## 2022-02-06 NOTE — Therapy (Signed)
[place] [facility] ?[place], [place], [address] ?Phone: [phone]   Fax:  [phone] ? ?Speech Language Pathology Treatment ? ?Patient Details  ?Name: Casey Adkins ?MRN: [id number] ?Date of Birth: 20-Aug-1934 ?Referring Provider (SLP): Rusty Aus, MD ? ? ?Encounter Date: 02/06/2022 ? ? End of Session - 02/06/22 1336   ? ? Visit Number 16   ? Number of Visits 37   ? Date for SLP Re-Evaluation 04/28/22   ? SLP Start Time 1000   ? SLP Stop Time  1100   ? SLP Time Calculation (min) 60 min   ? Activity Tolerance Patient tolerated treatment well   ? ?  ?  ? ?  ? ? ?Past Medical History:  ?Diagnosis Date  ? Allergic rhinitis   ? Arthritis   ? Cancer [facility])   ? Bladder Cancer  ? Cancer [facility])   ? Prostate Cancer  ? Diabetes mellitus without complication ([place])   ? Diverticulosis   ? Dysphagia   ? GERD (gastroesophageal reflux disease)   ? History of kidney stones   ? h/o  ? Hypertension   ? Stroke [facility]) 2006  ? possible light stroke-no deficits  ? ? ?Past Surgical History:  ?Procedure Laterality Date  ? CYSTOSCOPY W/ RETROGRADES Bilateral 06/07/2019  ? Procedure: CYSTOSCOPY WITH RETROGRADE PYELOGRAM;  Surgeon: Hollice Espy, MD;  Location: [facility] ORS;  Service: Urology;  Laterality: Bilateral;  ? CYSTOSCOPY WITH BIOPSY N/A 06/07/2019  ? Procedure: CYSTOSCOPY WITH Bladder BIOPSY;  Surgeon: Hollice Espy, MD;  Location: [facility] ORS;  Service: Urology;  Laterality: N/A;  ? ESOPHAGOGASTRODUODENOSCOPY N/A 04/21/2015  ? Procedure: ESOPHAGOGASTRODUODENOSCOPY (EGD);  Surgeon: Hulen Luster, MD;  Location: [facility] ENDOSCOPY;  Service: Gastroenterology;  Laterality: N/A;  ? LITHOTRIPSY    ? SINUS SURGERY WITH INSTATRAK  2008  ? STAPEDES SURGERY Left age 42  ? 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 TONSILLECTOMY    ? ? ?There were no vitals filed for this visit. ? ? Subjective Assessment - 02/06/22 1329   ? ? Subjective Reports he is tired   ? Patient is  accompained by: Family member   spouse Narda Rutherford  ? Currently in Pain? No/denies   ? ?  ?  ? ?  ? ? ? ? ? ? ? ? ADULT SLP TREATMENT - 02/06/22 1330   ? ?  ? General Information  ? Behavior/Cognition Alert;Cooperative;Pleasant mood   ? HPI Casey Adkins is an 88 y.o. male referred for MBS by his PCP Dr. Emily Filbert. Pt admitted 10/10/21- 10/11/21 for CVA. 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 Unable to obtain MRI due to ear implant. Hx of esophageal dysphagia requiring dilation in the past; reports 1 year hx of worsening dysphagia prior to CVA. Neurology noted right upper motor neuron pattern weakness, suspected small vessel lacunar infarction. 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 Wife assists with history given pt's dysarthria, aphasia.   ?  ? Treatment Provided  ? Treatment provided Cognitive-Linquistic   ?  ? Cognitive-Linquistic Treatment  ? Treatment focused on Dysarthria;Aphasia;Patient/family/caregiver education   ? Skilled Treatment Targeted dysarthria at sentence level with functional reading task. Pt read exercise instructions aloud for OT and PT aloud with SLP following pt instructions; requests for repetition necessary x5; intelligibility judged as 95% in context. Pt increased self-monitoring and correction  during structured tasks from previous session, ~70% today. Minimal pair task with word initial consonant voicing differences 100% accuracy (sufficient for SLP discrimination). Decreases to 90% accuracy with final consonant focus. Pt generated sentences with initial sk/sl blends 85% intelligibility, increases to 100% with min cues.   ?  ? Assessment / Recommendations / Plan  ? Plan Continue with current plan of care   ?  ? Progression Toward Goals  ? Progression toward goals Progressing toward goals   ? ?  ?  ? ?  ? ? ? SLP Education - 02/06/22 1335   ? ? Education Details  rational for need to compensate during speech   ? Person(s) Educated Patient   ? Methods Explanation   ? Comprehension Verbalized understanding   ? ?  ?  ? ?  ? ? ? SLP Short Term Goals - 01/28/22 1014   ? ?  ? SLP SHORT TERM GOAL #1  ? Title Pt will use aphasia compensations during 5-8 minutes conversation with occasional min cues.   ? Time 10   ? Period --   sessions  ? Status Achieved   ?  ? SLP SHORT TERM GOAL #2  ? Title Pt will demonstrate dysarthria HEP with rare min cues.   ? Time 10   ? Period --   sessions  ? Status Achieved   ?  ? SLP SHORT TERM GOAL #3  ? Title Patient will use compensations for dysarthria in sentence responses for >95% intelligibility.   ? Time 10   ? Period --   sessions  ? Status Achieved   ?  ? SLP SHORT TERM GOAL #4  ? Title Pt will complete further assessment of cognitive-communication PRN.   ? Time 10   ? Period --   sessions  ? Status Achieved   ?  ? SLP SHORT TERM GOAL #5  ? Title Patient will use compensations for dysarthria in sentence-level reading for >95% intelligibility.   ? Time 10   ? Period --   sessions  ? Status Revised   revised after worsening symptoms, possible new CVA on 01/21/22  ?  ? SLP SHORT TERM GOAL #6  ? Title Patient will demonstrate alternating attention over 5 minutes between 2 simple cognitive-linguistic task with 90% accuracy.   ? Time 10   ? Period --   sessions  ? Status Revised   revised after worsening symptoms, possible new CVA on 01/21/22  ? ?  ?  ? ?  ? ? ? SLP Long Term Goals - 01/28/22 1016   ? ?  ? SLP LONG TERM GOAL #1  ? Title Pt will use aphasia compensations effectively during 15 minutes mod complex conversation with modified independence.   ? Time 12   ? Period Weeks   ? Status Revised   revised after worsening symptoms, possible new CVA on 01/21/22  ?  ? SLP LONG TERM GOAL #2  ? Title Pt will use aphasia compensations with modified independence to resolve communication breakdown at least 3 occasions outside of ST, per pt/spouse report.   ?  Time 12   ? Period Weeks   ? Status On-going   ?  ? SLP LONG TERM GOAL #3  ? Title Patient will use compensations for dysarthria in 15 minute conversation for >95% intelligibility with no more than 2 repetitions necessary.   ? Time 12   ? Period Weeks   ? Status Revised   revised after worsening symptoms,  possible new CVA on 01/21/22  ?  ? SLP LONG TERM GOAL #4  ? Title Patient will demonstrate swallowing precautions independently x3 sessions with no overt s/sx aspiration.   ? Time 12   ? Period Weeks   ? Status On-going   ? ?  ?  ? ?  ? ? ? Plan - 02/06/22 1336   ? ? Clinical Impression Statement Patient continues with moderate dysarthria, mild aphasia, and cognitive communication deficits, slightly worsened since hospitalization 01/21/22 for new stroke-like symptoms. Progressing with intelligibility in sentence-level tasks. Continue to reinforce use of dysathria compensations to improve speech intelligibility. Pt/spouse deny changes in swallow function. I recommend continued skilled ST to improve intelligibility, verbal expression and swallowing safety necessary for increased communicatory success and independence in the home and community environments.   ? Speech Therapy Frequency 2x / week   ? Duration 12 weeks   ? Treatment/Interventions Aspiration precaution training;Environmental controls;Language facilitation;Cueing hierarchy;Oral motor exercises;SLP instruction and feedback;Pharyngeal strengthening exercises;Compensatory techniques;Cognitive reorganization;Functional tasks;Compensatory strategies;Diet toleration management by SLP;Internal/external aids;Multimodal communcation approach;Patient/family education   ? Potential to Achieve Goals Good   ? Consulted and Agree with Plan of Care Patient;Family member/caregiver   ? ?  ?  ? ?  ? ? ?Patient will benefit from skilled therapeutic intervention in order to improve the following deficits and impairments:   ?Dysarthria and anarthria ? ?Aphasia ? ? ? ?Problem  List ?Patient Active Problem List  ? Diagnosis Date Noted  ? HLD (hyperlipidemia) 01/21/2022  ? Essential tremor 01/21/2022  ? Stroke-like episode   ? Overweight (BMI 25.0-29.9) 10/11/2021  ? Hypertrigly

## 2022-02-06 NOTE — Therapy (Signed)
[facility] ?[place], [place], [address] ?Phone: [phone]   Fax:  [phone] ? ?Occupational Therapy Treatment ? ?Patient Details  ?Name: Casey Adkins ?MRN: [id number] ?Date of Birth: 05-22-1934 ?Referring Provider (OT): Dr. Emily Filbert, PCP ? ? ?Encounter Date: 02/04/2022 ? ? OT End of Session - 02/05/22 1511   ? ? Visit Number 2   ? Number of Visits 12   ? Date for OT Re-Evaluation 03/13/22   ? Authorization Type Progress reporting period starting 01/31/2022   ? OT Start Time 1100   ? OT Stop Time 1145   ? OT Time Calculation (min) 45 min   ? Activity Tolerance Patient tolerated treatment well   ? Behavior During Therapy [facility] for tasks assessed/performed   ? ?  ?  ? ?  ? ? ?Past Medical History:  ?Diagnosis Date  ? Allergic rhinitis   ? Arthritis   ? Cancer [facility])   ? Bladder Cancer  ? Cancer [facility])   ? Prostate Cancer  ? Diabetes mellitus without complication ([place])   ? Diverticulosis   ? Dysphagia   ? GERD (gastroesophageal reflux disease)   ? History of kidney stones   ? h/o  ? Hypertension   ? Stroke [facility]) 2006  ? possible light stroke-no deficits  ? ? ?Past Surgical History:  ?Procedure Laterality Date  ? CYSTOSCOPY W/ RETROGRADES Bilateral 06/07/2019  ? Procedure: CYSTOSCOPY WITH RETROGRADE PYELOGRAM;  Surgeon: Hollice Espy, MD;  Location: [facility] ORS;  Service: Urology;  Laterality: Bilateral;  ? CYSTOSCOPY WITH BIOPSY N/A 06/07/2019  ? Procedure: CYSTOSCOPY WITH Bladder BIOPSY;  Surgeon: Hollice Espy, MD;  Location: [facility] ORS;  Service: Urology;  Laterality: N/A;  ? ESOPHAGOGASTRODUODENOSCOPY N/A 04/21/2015  ? Procedure: ESOPHAGOGASTRODUODENOSCOPY (EGD);  Surgeon: Hulen Luster, MD;  Location: [facility] ENDOSCOPY;  Service: Gastroenterology;  Laterality: N/A;  ? LITHOTRIPSY    ? SINUS SURGERY WITH INSTATRAK  2008  ? STAPEDES SURGERY Left age 72  ? 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 TONSILLECTOMY    ? ? ?There were no  vitals filed for this visit. ? ? Subjective Assessment - 02/06/22 1509   ? ? Subjective  Pt denies any pain this date, reports he still has episodes of dizziness and decreased balance, seeing PT   ? 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  Unable to confirm another stroke via MRI d/t ear implant.   ? Limitations R sided weakness, lack of coordination, impaired balance, slurred speech   ? Currently in Pain? No/denies   ? Pain Score 0-No pain   ? ?  ?  ? ?  ?Neuro: ?Pt seen for focus on right hand coordination and manipulation skills with use of [place] discs, flipping and stacking, 20 reps from tabletop.  Pt performed another 20 reps to flip and place items into bucket with cues for isolated finger movements and emphasis on speed and dexterity.  Able to stack items in one large, tall stack of 20 discs with precision and did not drop or knock over the stack.  ?Manipulation of glass beads to pick up, some with flat side down, others with flat side up, move  to palm and back to finger tips, using hand for storage as well as translatory skills of the hand with cues for proper technique, speed. ?Pt attempting to perform shuffling of cards, bilateral and unilateral (flipping through one half of deck), increased difficulty with this task and reports he was able to perform previously without difficulty.   ?Focused on finger and thumb combos, tends to use thumb more with this task and demonstrates increased difficulty with alternating patterns of thumb and finger. ? ?ADL: ?Handwriting, has an essential tremor, attempted several types of pens as follows: gel pen with decreased control of ink flow, regular ink pen most  comfortable per pt, large gripped pen, feels the same but reports it also feels awkward.   ?Legibility of handwriting is poor, list of veggies 6 , then fruits.  Wife was only able to read 3/6 each trial and that was with increased time analyzing pt writing. ?Focused on formulation of larger letters with improved success, also attempted the following with limited success: ?Weighted pen ?Pen again ? ?Response to tx: ?Pt continues to demonstrate improvements with coordination and manipulation skills of the right hand with select tasks.  He has difficulty with shuffling cards, alternating with thumb and finger combinations as well as with speed of task.  He did best with [place] discs with turning and stacking.  Pt has a history of essential tremor in his right hand and his handwriting legibility is poor.  He and his wife report although his handwriting was poor before, it is worse currently.  He did not see a change in quality with use of weighted pen.  He preferred regular ink pen with writing this date, responds to cues for formulation of larger scale of letter formation.  Continue to work towards goals in plan of care to maximize safety and independence in necessary daily tasks.  ? ? ? ? ? ? ? ? ? ? ? ? ? ? ? ? ? ? ? ? ? OT Education - 02/05/22 1510   ? ? Education Details Right hand strength, and [facility] skills, handwriting   ? Person(s) Educated Patient;Spouse   ? Methods Explanation;Verbal cues   ? Comprehension Verbalized understanding;Need further instruction   ? ?  ?  ? ?  ? ? ? OT Short Term Goals - 02/01/22 1514   ? ?  ? OT SHORT TERM GOAL #1  ? Title Pt will perform HEP for increasing hand coordination with min vc.   ? Baseline Eval: initiated HEP at eval, further training needed   ? Time 3   ? Period Weeks   ? Status New   ? Target Date 02/20/22   ? ?  ?  ? ?  ? ? ? ? OT Long Term Goals - 02/01/22 1514   ? ?  ? OT LONG TERM GOAL #1  ? Title Pt will increase FOTO score by 2 or more points to indicate  increased functional performance.   ? Baseline Eval: FOTO 60   ? Time 6   ? Period Weeks   ? Status New   ? Target Date 03/13/22   ?  ? OT LONG TERM GOAL #2  ? Title Pt will increase R hand coordination to manage eating utensils with good control.   ? Baseline Eval: Pt reports being clumsy with utensils   ? Time 6   ? Period Weeks   ? Status New   ? Target Date 03/13/22   ?  ? OT LONG  TERM GOAL #3  ? Title Pt will increase R hand dexterity for better manipulation of ADL supplies as noted by <60 sec to complete 9 hole peg test.   ? Baseline Eval: R 1 min 7 sec, L 39 sec   ? Time 6   ? Period Weeks   ? Status New   ? Target Date 03/13/22   ?  ? OT LONG TERM GOAL #4  ? Title Pt will perform bathroom transfers with modified indep-indep.   ? Baseline Eval: spouse supervises all functional transfers; pt using RW for support.   ? Time 6   ? Period Weeks   ? Status New   ? Target Date 03/13/22   ?  ? OT LONG TERM GOAL #5  ? Title Pt will increase dynamic standing balance to enable pt to roll a light weight (5lbs or less) ball independently to work towards return to bowling.   ? Baseline Eval: unable to participate in bowling (pt's primary leisure activity)   ? Time 6   ? Period Weeks   ? Status New   ? Target Date 03/13/22   ? ?  ?  ? ?  ? ? ? ? ? ? ? ? Plan - 02/05/22 1511   ? ? Clinical Impression Statement Pt continues to demonstrate improvements with coordination and manipulation skills of the right hand with select tasks.  He has difficulty with shuffling cards, alternating with thumb and finger combinations as well as with speed of task.  He did best with [place] discs with turning and stacking.  Pt has a history of essential tremor in his right hand and his handwriting legibility is poor.  He and his wife report although his handwriting was poor before, it is worse currently.  He did not see a change in quality with use of weighted pen.  He preferred regular ink pen with writing this date, responds to cues for  formulation of larger scale of letter formation.  Continue to work towards goals in plan of care to maximize safety and independence in necessary daily tasks.   ? OT Occupational Profile and History Problem Focused Asses

## 2022-02-06 NOTE — Therapy (Signed)
[place] [facility] ?[place], [place], [address] ?Phone: [phone]   Fax:  [phone] ? ?Occupational Therapy Treatment ? ?Patient Details  ?Name: Casey Adkins ?MRN: [id number] ?Date of Birth: May 01, 1934 ?Referring Provider (OT): Dr. Emily Filbert, PCP ? ? ?Encounter Date: 02/06/2022 ? ? OT End of Session - 02/06/22 1219   ? ? Visit Number 3   ? Number of Visits 12   ? Date for OT Re-Evaluation 03/13/22   ? Authorization Type Progress reporting period starting 01/31/2022   ? OT Start Time 0845   ? OT Stop Time 0930   ? OT Time Calculation (min) 45 min   ? Activity Tolerance Patient tolerated treatment well   ? Behavior During Therapy [facility] for tasks assessed/performed   ? ?  ?  ? ?  ? ? ?Past Medical History:  ?Diagnosis Date  ? Allergic rhinitis   ? Arthritis   ? Cancer [facility])   ? Bladder Cancer  ? Cancer [facility])   ? Prostate Cancer  ? Diabetes mellitus without complication ([place])   ? Diverticulosis   ? Dysphagia   ? GERD (gastroesophageal reflux disease)   ? History of kidney stones   ? h/o  ? Hypertension   ? Stroke [facility]) 2006  ? possible light stroke-no deficits  ? ? ?Past Surgical History:  ?Procedure Laterality Date  ? CYSTOSCOPY W/ RETROGRADES Bilateral 06/07/2019  ? Procedure: CYSTOSCOPY WITH RETROGRADE PYELOGRAM;  Surgeon: Hollice Espy, MD;  Location: [facility] ORS;  Service: Urology;  Laterality: Bilateral;  ? CYSTOSCOPY WITH BIOPSY N/A 06/07/2019  ? Procedure: CYSTOSCOPY WITH Bladder BIOPSY;  Surgeon: Hollice Espy, MD;  Location: [facility] ORS;  Service: Urology;  Laterality: N/A;  ? ESOPHAGOGASTRODUODENOSCOPY N/A 04/21/2015  ? Procedure: ESOPHAGOGASTRODUODENOSCOPY (EGD);  Surgeon: Hulen Luster, MD;  Location: [facility] ENDOSCOPY;  Service: Gastroenterology;  Laterality: N/A;  ? LITHOTRIPSY    ? SINUS SURGERY WITH INSTATRAK  2008  ? STAPEDES SURGERY Left age 9  ? 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 TONSILLECTOMY    ? ? ?There were no  vitals filed for this visit. ? ? Subjective Assessment - 02/06/22 1217   ? ? Subjective  Pt. reports they had to leave the house shortly after 7am this morning   ? 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  Unable to confirm another stroke via MRI d/t ear implant.   ? Limitations R sided weakness, lack of coordination, impaired balance, slurred speech   ? Patient Stated Goals To get back to bowling.   ? Currently in Pain? No/denies   ? ?  ?  ? ?  ? ?OT TREATMENT   ? ?Neuro muscular re-education: ? ?Pt. performed [facility] tasks using the Grooved pegboard. Pt. worked on grasping the grooved pegs from a horizontal position, and moving the pegs to a vertical position in the hand to prepare for placing them in the grooved slot.  Pt. worked on removing the pegs using thumb opposition to the tip of his thumb through 5th digit. Pt. worked on attempting to grasp, and store the pegs in in the palm of his right hand. ? ?Therapeutic Exercise: ? ?Pt. performed gross  gripping with a gross grip strengthener. Pt. worked on sustaining grip while grasping pegs and reaching at various heights. The Gripper was set to  17.9 # of grip strength resistance. Pt. Worked on pinch strengthening in the left hand for lateral, and 3pt. pinch using yellow, red, green, and blue resistive clips. Pt. worked on placing the clips at various vertical and horizontal angles. Tactile and verbal cues were required for eliciting the desired movement.  Pt. Worked on green thearputty ex. For hand strengthening. Exercises including: gross gripping, gross digit extension, thumb abduction, lateral, and 3pt. Pinch strengthening, digit abduction, and thumb opposition. Pt.  Was provided with a visual handout HEP through [employer] with an access code. ? ?Pt. Is making progress, and was able to tolerate increased grip strength to 17.9#. Pt. required visual, and tactile cues for hand position, and proper technique for lateral, and 3pt. Pinch on the clips. Pt. required increased cues for visual demonstration of the theraputty exercises. Pt. Continues to present with difficulty grasping small objects reporting that his finger tips have always felt slick. Pt. Continues to work on improving Right hand functioning order to work towards improving engagement in, and maximizing independence with ADLs, and overall IADL tasks.  ? ? ? ? ? ? ? ? ? ? ? ? ? ? ? ? ? ? ? ? ? OT Education - 02/06/22 1218   ? ? Education Details Right hand strength, and [facility] skills. HEP for Theraputty.   ? Person(s) Educated Patient;Spouse   ? Methods Explanation;Verbal cues   ? Comprehension Verbalized understanding;Need further instruction   ? ?  ?  ? ?  ? ? ? OT Short Term Goals - 02/01/22 1514   ? ?  ? OT SHORT TERM GOAL #1  ? Title Pt will perform HEP for increasing hand coordination with min vc.   ? Baseline Eval: initiated HEP at eval, further training needed   ? Time 3   ? Period Weeks   ? Status New   ? Target Date 02/20/22   ? ?  ?  ? ?  ? ? ? ? OT Long Term Goals - 02/01/22 1514   ? ?  ? OT LONG TERM GOAL #1  ? Title Pt will increase FOTO score by 2 or more points to indicate increased functional performance.   ? Baseline Eval: FOTO 60   ? Time 6   ? Period Weeks   ? Status New   ? Target Date 03/13/22   ?  ? OT LONG TERM GOAL #2  ? Title Pt will increase R hand coordination to manage eating utensils with good control.   ? Baseline Eval: Pt reports being clumsy with utensils   ? Time 6   ? Period Weeks   ? Status New   ? Target Date 03/13/22   ?  ? OT LONG TERM GOAL #3  ? Title Pt will increase R hand dexterity for better manipulation of ADL supplies as noted by <60 sec to complete 9 hole peg test.   ? Baseline  Eval: R 1 min 7 sec, L 39 sec   ? Time 6   ? Period Weeks   ? Status New   ? Target Date 03/13/22   ?  ? OT LONG TERM GOAL #4  ? Title Pt will perform bathroom transfers with modified indep-indep.   ? Baseline Eval: spouse supervises all functional transfers; pt using RW for support.   ? Time 6   ?  Period Weeks   ? Status New   ? Target Date 03/13/22   ?  ? OT LONG TERM GOAL #5  ? Title Pt will increase dynamic standing balance to enable pt to roll a light weight (5lbs or less) ball independently to work towards return to bowling.   ? Baseline Eval: unable to participate in bowling (pt's primary leisure activity)   ? Time 6   ? Period Weeks   ? Status New   ? Target Date 03/13/22   ? ?  ?  ? ?  ? ? ? ? ? ? ? ? Plan - 02/06/22 1254   ? ? Clinical Impression Statement Pt. Is making progress, and was able to tolerate increased grip strength to 17.9#. Pt. required visual, and tactile cues for hand position, and proper technique for lateral, and 3pt. Pinch on the clips. Pt. required increased cues for visual demonstration of the theraputty exercises. Pt. Continues to present with difficulty grasping small objects reporting that his finger tips have always felt slick. Pt. Continues to work on improving Right hand functioning order to work towards improving engagement in, and maximizing independence with ADLs, and overall IADL tasks.   ? OT Occupational Profile and History Problem Focused Assessment - Including review of records relating to presenting problem   ? Occupational performance deficits (Please refer to evaluation for details): ADL's;IADL's;Leisure   ? Body Structure / Function / Physical Skills ADL;Coordination;[facility];UE functional use;Balance;IADL;Dexterity;FMC;Mobility;Gait   ? Rehab Potential Good   ? Clinical Decision Making Limited treatment options, no task modification necessary   ? Comorbidities Affecting Occupational Performance: May have comorbidities impacting occupational performance   ? Modification or  Assistance to Complete Evaluation  No modification of tasks or assist necessary to complete eval   ? OT Frequency 2x / week   ? OT Duration 6 weeks   ? OT Treatment/Interventions Self-care/ADL trainin

## 2022-02-11 ENCOUNTER — Other Ambulatory Visit: Payer: Self-pay

## 2022-02-11 ENCOUNTER — Ambulatory Visit: Payer: Medicare Other | Admitting: Speech Pathology

## 2022-02-11 ENCOUNTER — Ambulatory Visit: Payer: Medicare Other

## 2022-02-11 ENCOUNTER — Encounter: Payer: Self-pay | Admitting: Occupational Therapy

## 2022-02-11 ENCOUNTER — Ambulatory Visit: Payer: Medicare Other | Admitting: Occupational Therapy

## 2022-02-11 DIAGNOSIS — R471 Dysarthria and anarthria: Secondary | ICD-10-CM

## 2022-02-11 DIAGNOSIS — M6281 Muscle weakness (generalized): Secondary | ICD-10-CM

## 2022-02-11 DIAGNOSIS — R2681 Unsteadiness on feet: Secondary | ICD-10-CM

## 2022-02-11 DIAGNOSIS — R262 Difficulty in walking, not elsewhere classified: Secondary | ICD-10-CM

## 2022-02-11 DIAGNOSIS — R278 Other lack of coordination: Secondary | ICD-10-CM

## 2022-02-11 DIAGNOSIS — R4701 Aphasia: Secondary | ICD-10-CM

## 2022-02-11 DIAGNOSIS — R269 Unspecified abnormalities of gait and mobility: Secondary | ICD-10-CM

## 2022-02-11 NOTE — Therapy (Signed)
[facility] ?[place], [place], [address] ?Phone: [phone]   Fax:  [phone] ? ?Speech Language Pathology Treatment ? ?Patient Details  ?Name: Casey Adkins ?MRN: [id number] ?Date of Birth: May 04, 1934 ?Referring Provider (SLP): Rusty Aus, MD ? ? ?Encounter Date: 02/11/2022 ? ? End of Session - 02/11/22 1647   ? ? Visit Number 17   ? Number of Visits 37   ? Date for SLP Re-Evaluation 04/28/22   ? SLP Start Time [id number]   ? SLP Stop Time  1100   ? SLP Time Calculation (min) 67 min   ? Activity Tolerance Patient tolerated treatment well   ? ?  ?  ? ?  ? ? ?Past Medical History:  ?Diagnosis Date  ? Allergic rhinitis   ? Arthritis   ? Cancer [facility])   ? Bladder Cancer  ? Cancer [facility])   ? Prostate Cancer  ? Diabetes mellitus without complication ([place])   ? Diverticulosis   ? Dysphagia   ? GERD (gastroesophageal reflux disease)   ? History of kidney stones   ? h/o  ? Hypertension   ? Stroke [facility]) 2006  ? possible light stroke-no deficits  ? ? ?Past Surgical History:  ?Procedure Laterality Date  ? CYSTOSCOPY W/ RETROGRADES Bilateral 06/07/2019  ? Procedure: CYSTOSCOPY WITH RETROGRADE PYELOGRAM;  Surgeon: Hollice Espy, MD;  Location: [facility] ORS;  Service: Urology;  Laterality: Bilateral;  ? CYSTOSCOPY WITH BIOPSY N/A 06/07/2019  ? Procedure: CYSTOSCOPY WITH Bladder BIOPSY;  Surgeon: Hollice Espy, MD;  Location: [facility] ORS;  Service: Urology;  Laterality: N/A;  ? ESOPHAGOGASTRODUODENOSCOPY N/A 04/21/2015  ? Procedure: ESOPHAGOGASTRODUODENOSCOPY (EGD);  Surgeon: Hulen Luster, MD;  Location: [facility] ENDOSCOPY;  Service: Gastroenterology;  Laterality: N/A;  ? LITHOTRIPSY    ? SINUS SURGERY WITH INSTATRAK  2008  ? STAPEDES SURGERY Left age 34  ? 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 TONSILLECTOMY    ? ? ?There were no vitals filed for this visit. ? ? Subjective Assessment - 02/11/22 1615   ? ? Subjective "I can't do it"   ? Patient is  accompained by: Family member   Spouse present for session  ? Currently in Pain? No/denies   ? Pain Score 0-No pain   ? ?  ?  ? ?  ? ? ? ? ? ? ? ? ADULT SLP TREATMENT - 02/11/22 1619   ? ?  ? General Information  ? Behavior/Cognition Alert;Cooperative;Pleasant mood   ? HPI Casey Adkins is an 88 y.o. male referred for MBS by his PCP Dr. Emily Filbert. Pt admitted 10/10/21- 10/11/21 for CVA. 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 Unable to obtain MRI due to ear implant. Hx of esophageal dysphagia requiring dilation in the past; reports 1 year hx of worsening dysphagia prior to CVA. Neurology noted right upper motor neuron pattern weakness, suspected small vessel lacunar infarction. 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 Wife assists with history given pt's dysarthria, aphasia.   ?  ? Treatment Provided  ? Treatment provided Cognitive-Linquistic   ?  ? Cognitive-Linquistic Treatment  ? Treatment focused on Dysarthria;Aphasia;Patient/family/caregiver education   ? Skilled Treatment Targeted dysarthria with word level and phrase level reading tasks (/bl/, /br/ and /dr/ blends). Pt intelligibility was ~70% acc, increased to 90% acc with min cues to use dysarthria compensations (  SLOP strategies). Pt produced sentences with bl/br/dr blends with 80% intelligibilty with min-mod cues. He required cues to slow down and repeat unintelligible speech. Toward end of task, pt increased self-monitoring of speech and independently repeated himself x3. Targeted naming/word finding in naming tasks. In categorical naming task, pt was able to generate 4-5 names for different categories. He required mod verbal cues to use strategies (semantic feature analysis) to generate more words. Pt requested to skip to the next category when he could not think of more words. Facilitated using strategies to aid in  word-finding. During naming task (naming from definition), pt was able to provide object names with 77% acc (14/18), increased to 100% acc with min verbal cues (SFA).   ?  ? Assessment / Recommendations / Plan  ? Plan Continue with current plan of care   ?  ? Progression Toward Goals  ? Progression toward goals Progressing toward goals   ? ?  ?  ? ?  ? ? ? SLP Education - 02/11/22 1647   ? ? Education Details Dysarthria compensations to increase intelligibility   ? Person(s) Educated Patient;Spouse   ? Methods Explanation   ? Comprehension Verbalized understanding   ? ?  ?  ? ?  ? ? ? SLP Short Term Goals - 01/28/22 1014   ? ?  ? SLP SHORT TERM GOAL #1  ? Title Pt will use aphasia compensations during 5-8 minutes conversation with occasional min cues.   ? Time 10   ? Period --   sessions  ? Status Achieved   ?  ? SLP SHORT TERM GOAL #2  ? Title Pt will demonstrate dysarthria HEP with rare min cues.   ? Time 10   ? Period --   sessions  ? Status Achieved   ?  ? SLP SHORT TERM GOAL #3  ? Title Patient will use compensations for dysarthria in sentence responses for >95% intelligibility.   ? Time 10   ? Period --   sessions  ? Status Achieved   ?  ? SLP SHORT TERM GOAL #4  ? Title Pt will complete further assessment of cognitive-communication PRN.   ? Time 10   ? Period --   sessions  ? Status Achieved   ?  ? SLP SHORT TERM GOAL #5  ? Title Patient will use compensations for dysarthria in sentence-level reading for >95% intelligibility.   ? Time 10   ? Period --   sessions  ? Status Revised   revised after worsening symptoms, possible new CVA on 01/21/22  ?  ? SLP SHORT TERM GOAL #6  ? Title Patient will demonstrate alternating attention over 5 minutes between 2 simple cognitive-linguistic task with 90% accuracy.   ? Time 10   ? Period --   sessions  ? Status Revised   revised after worsening symptoms, possible new CVA on 01/21/22  ? ?  ?  ? ?  ? ? ? SLP Long Term Goals - 01/28/22 1016   ? ?  ? SLP LONG TERM GOAL #1  ?  Title Pt will use aphasia compensations effectively during 15 minutes mod complex conversation with modified independence.   ? Time 12   ? Period Weeks   ? Status Revised   revised after worsening symptoms, possible new CVA on 01/21/22  ?  ? SLP LONG TERM GOAL #2  ? Title Pt will use aphasia compensations with modified independence to resolve communication breakdown at least 3 occasions outside  of ST, per pt/spouse report.   ? Time 12   ? Period Weeks   ? Status On-going   ?  ? SLP LONG TERM GOAL #3  ? Title Patient will use compensations for dysarthria in 15 minute conversation for >95% intelligibility with no more than 2 repetitions necessary.   ? Time 12   ? Period Weeks   ? Status Revised   revised after worsening symptoms, possible new CVA on 01/21/22  ?  ? SLP LONG TERM GOAL #4  ? Title Patient will demonstrate swallowing precautions independently x3 sessions with no overt s/sx aspiration.   ? Time 12   ? Period Weeks   ? Status On-going   ? ?  ?  ? ?  ? ? ? Plan - 02/11/22 1639   ? ? Clinical Impression Statement Patient continues with moderate dysarthria, mild aphasia, and cognitive communication deficits, slightly worsened since hospitalization 01/21/22 for new stroke-like symptoms. Pt reports frustration with having to talk slower, but continues to be receptive to using dysarthria compensations. Affirmed that practicing strategies at home that are trained in sessions will aid in carryover. Continue to reinforce use of dysarthria compensations to improve speech intelligibility. Pt encouraged to use compensations for anomia. I recommend continued skilled ST to improve intelligibility, verbal expression and swallowing safety necessary for increased communicatory success and independence in the home and community environments.   ? Speech Therapy Frequency 2x / week   ? Duration 12 weeks   ? Treatment/Interventions Aspiration precaution training;Environmental controls;Language facilitation;Cueing hierarchy;Oral  motor exercises;SLP instruction and feedback;Pharyngeal strengthening exercises;Compensatory techniques;Cognitive reorganization;Functional tasks;Compensatory strategies;Diet toleration management by SLP;Internal/exter

## 2022-02-11 NOTE — Therapy (Addendum)
[facility] ?[place], [place], [address] ?Phone: [phone]   Fax:  [phone] ? ?Physical Therapy Treatment ? ?Patient Details  ?Name: Casey Adkins ?MRN: [id number] ?Date of Birth: Feb 09, 1934 ?Referring Provider (PT): Dr. Emily Filbert ? ? ?Encounter Date: 02/11/2022 ? ? PT End of Session - 02/11/22 0806   ? ? Visit number ?Number of Visits ? 3 ?25   ? Date for PT Re-Evaluation 04/25/22   ? Authorization Time Period initial cert 11/27/2438- 1/0/2725   ? Progress Note Due on Visit 10   ? PT Start Time 0802   ? PT Stop Time 0845   ? PT Time Calculation (min) 43 min   ? Equipment Utilized During Treatment Gait belt   ? Activity Tolerance Patient tolerated treatment well   ? Behavior During Therapy [facility] for tasks assessed/performed   ? ?  ?  ? ?  ? ? ?Past Medical History:  ?Diagnosis Date  ? Allergic rhinitis   ? Arthritis   ? Cancer [facility])   ? Bladder Cancer  ? Cancer [facility])   ? Prostate Cancer  ? Diabetes mellitus without complication ([place])   ? Diverticulosis   ? Dysphagia   ? GERD (gastroesophageal reflux disease)   ? History of kidney stones   ? h/o  ? Hypertension   ? Stroke [facility]) 2006  ? possible light stroke-no deficits  ? ? ?Past Surgical History:  ?Procedure Laterality Date  ? CYSTOSCOPY W/ RETROGRADES Bilateral 06/07/2019  ? Procedure: CYSTOSCOPY WITH RETROGRADE PYELOGRAM;  Surgeon: Hollice Espy, MD;  Location: [facility] ORS;  Service: Urology;  Laterality: Bilateral;  ? CYSTOSCOPY WITH BIOPSY N/A 06/07/2019  ? Procedure: CYSTOSCOPY WITH Bladder BIOPSY;  Surgeon: Hollice Espy, MD;  Location: [facility] ORS;  Service: Urology;  Laterality: N/A;  ? ESOPHAGOGASTRODUODENOSCOPY N/A 04/21/2015  ? Procedure: ESOPHAGOGASTRODUODENOSCOPY (EGD);  Surgeon: Hulen Luster, MD;  Location: [facility] ENDOSCOPY;  Service: Gastroenterology;  Laterality: N/A;  ? LITHOTRIPSY    ? SINUS SURGERY WITH INSTATRAK  2008  ? STAPEDES SURGERY Left age 62  ? 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 TONSILLECTOMY    ? ? ?There were no vitals filed for this visit. ? ? Subjective Assessment - 02/11/22 0805   ? ? Subjective Patient reports he is doing well - states "not sure how much longer I need you." When asked if he had any falls- He reports "just one - trying to open the car door with my cane." He states he is okay today but did scrap up his right Lower leg.   ? Patient is accompained by: Family member   ? Pertinent History Casey Adkins is an 88 y.o. male referred by his PCP Dr. Emily Filbert for weakness. Pt was admitted on 10/10/21- 10/11/21 for CVA. 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 Unable to obtain MRI due to ear implant. Hx of esophageal dysphagia requiring dilation in the past; reports 1 year hx of worsening dysphagia prior to CVA.   ? How long can you sit comfortably? No issues   ? How long can you stand comfortably? Not limited as long as holding onto something   ? How long can you walk comfortably? < 15 min   ? Patient Stated Goals Walk without losing my balance and no falls   ? Currently in Pain? No/denies   ? ?  ?  ? ?  ? ? ?  INTERVENTIONS:  ? ?Neuromuscular re-ed  ? ?Static stand on blue airex pad with feet apart ?-eyes open x 20 sec  ?- eyes closed x 20 sec ? ?Static stand on blue airex pad with feet narrowed ?-eyes open x 20 sec  ?- eyes closed x 20 sec ?-eyes open with horizontal head turns ?- eyes closed with horizontal heat turn- hip strategy ? ?Static stand with feet staggered ?-eyes open x 20 sec  ?- eyes closed x 20 sec ?-eyes open with horizontal head turns ?- eyes closed with horizontal heat turn- hip strategy ? ?Static march on blue airex pad-2 min - 30 sec with UE suport and then instructed to perform without UE support - Some unsteadiness with minimal height of march- Intermittent reaching for UE Support bar.  ? ?Side step over orange hurdle x 15 reps (intermittent  reaching for UE support)  ?Forward/backward steps over orange hurdle ? ?Cane activity- 4 square- forward/backward/side step ?Added one more cone in the middle to add diagonal along with forward. Initially poor overall ability to coordinate knocking over 3/4 cones- but improved after 3 trials- CGA with use of gait belt today.  ? ?Tandem standing at support bar without UE support- hold 20 sec x 3 trials next LE.  ? ?SLS- Hold 5-8 sec each LE x multiple attempts. Patient improved with each trial- at best able to hold for 8 sec.  ? ?*Instructed to add tandem standing for up to 20 sec (performing each left leg in front and then right LE in front) and SLS to current HEP - perform 3-4 sets of each 3 x/week. Patient verbalized understanding of these 2 balance exercises. ? ?Education provided throughout session via VC/TC and demonstration to facilitate movement at target joints and correct muscle activation for all testing and exercises performed.  ? ? ? ? ? ? ? ? ? ? ? ? ? ? ? ? ? ? ? ? ? ? ? PT Education - 02/11/22 0807   ? ? Education Details Exercises techniqe; purpose of static and dynamic balance exercises to improve his overall balance.   ? Person(s) Educated Patient   ? Methods Explanation;Demonstration;Tactile cues;Verbal cues   ? Comprehension Verbalized understanding;Returned demonstration;Verbal cues required;Tactile cues required;Need further instruction   ? ?  ?  ? ?  ? ? ? PT Short Term Goals - 02/01/22 1215   ? ?  ? PT SHORT TERM GOAL #1  ? Title Pt will be independent with initial HEP in order to improve strength and balance in order to decrease fall risk and improve function at home and work.   ? Baseline Eval= No formal HEP in place   ? Time 6   ? Period Weeks   ? Status New   ? Target Date 03/14/22   ? ?  ?  ? ?  ? ? ? ? PT Long Term Goals - 02/01/22 1215   ? ?  ? PT LONG TERM GOAL #1  ? Title Pt will be independent with final HEP in order to improve strength and balance in order to decrease fall risk and  improve function at home and work.   ? Baseline Eval= No formal HEP in place   ? Time 12   ? Period Weeks   ? Status New   ? Target Date 04/25/22   ?  ? PT LONG TERM GOAL #2  ? Title Pt will improve FOTO to target score of to display perceived improvements in ability to complete  ADL's.   ? Baseline Eval= 60   ? Time 12   ? Period Weeks   ? Status New   ? Target Date 04/25/22   ?  ? PT LONG TERM GOAL #3  ? Title Pt will improve BERG by at least 3 points in order to demonstrate clinically significant improvement in balance.   ? Baseline Eval= 36/56   ? Time 12   ? Period Weeks   ? Status New   ? Target Date 04/25/22   ?  ? PT LONG TERM GOAL #4  ? Title Pt will decrease 5TSTS by at least 3 seconds in order to demonstrate clinically significant improvement in LE strength.   ? Baseline Eval= 23.3 sec with BUE Support   ? Time 12   ? Period Weeks   ? Status New   ? Target Date 04/25/22   ?  ? PT LONG TERM GOAL #5  ? Title Pt will decrease TUG to below 20 seconds/decrease in order to demonstrate decreased fall risk.   ? Baseline Eval= 31.44 with use of RW   ? Time 12   ? Period Weeks   ? Status New   ? Target Date 04/25/22   ?  ? Additional Long Term Goals  ? Additional Long Term Goals Yes   ?  ? PT LONG TERM GOAL #6  ? Title Pt will increase 10MWT by at least 0.13 m/s in order to demonstrate clinically significant improvement in community ambulation.   ? Baseline Eval= 0.62 m/s using RW   ? Time 12   ? Period Weeks   ? Status New   ? Target Date 04/25/22   ? ?  ?  ? ?  ? ? ? ? ? ? ? ? Plan - 02/11/22 0807   ? ? Clinical Impression Statement Patient presented with good motivation for today's session. He responded well to instruction and open to performing  all exercises as able. He did exhibit increased difficulty with activities with feet narrowed as well as eyes closed. His coordination improved with practice as seen by work with cones today- initially difficult and bumping over cones yet able to demonstrate good  coordination on last trial. Patient will benefit from continued PT services to improve overall LE strength, balance, and safe mobility for decreased risk of falling and improved functional mobility and quality

## 2022-02-11 NOTE — Therapy (Signed)
[place] [facility] ?[place], [place], [address] ?Phone: [phone]   Fax:  [phone] ? ?Occupational Therapy Treatment ? ?Patient Details  ?Name: Casey Adkins ?MRN: [id number] ?Date of Birth: July 28, 1934 ?Referring Provider (OT): Dr. Emily Filbert, PCP ? ? ?Encounter Date: 02/11/2022 ? ? OT End of Session - 02/11/22 0946   ? ? Visit Number 4   ? Number of Visits 12   ? Date for OT Re-Evaluation 03/13/22   ? Authorization Type Progress reporting period starting 01/31/2022   ? OT Start Time 0845   ? OT Stop Time 0930   ? OT Time Calculation (min) 45 min   ? Activity Tolerance Patient tolerated treatment well   ? Behavior During Therapy [facility] for tasks assessed/performed   ? ?  ?  ? ?  ? ? ?Past Medical History:  ?Diagnosis Date  ? Allergic rhinitis   ? Arthritis   ? Cancer [facility])   ? Bladder Cancer  ? Cancer [facility])   ? Prostate Cancer  ? Diabetes mellitus without complication ([place])   ? Diverticulosis   ? Dysphagia   ? GERD (gastroesophageal reflux disease)   ? History of kidney stones   ? h/o  ? Hypertension   ? Stroke [facility]) 2006  ? possible light stroke-no deficits  ? ? ?Past Surgical History:  ?Procedure Laterality Date  ? CYSTOSCOPY W/ RETROGRADES Bilateral 06/07/2019  ? Procedure: CYSTOSCOPY WITH RETROGRADE PYELOGRAM;  Surgeon: Hollice Espy, MD;  Location: [facility] ORS;  Service: Urology;  Laterality: Bilateral;  ? CYSTOSCOPY WITH BIOPSY N/A 06/07/2019  ? Procedure: CYSTOSCOPY WITH Bladder BIOPSY;  Surgeon: Hollice Espy, MD;  Location: [facility] ORS;  Service: Urology;  Laterality: N/A;  ? ESOPHAGOGASTRODUODENOSCOPY N/A 04/21/2015  ? Procedure: ESOPHAGOGASTRODUODENOSCOPY (EGD);  Surgeon: Hulen Luster, MD;  Location: [facility] ENDOSCOPY;  Service: Gastroenterology;  Laterality: N/A;  ? LITHOTRIPSY    ? SINUS SURGERY WITH INSTATRAK  2008  ? STAPEDES SURGERY Left age 89  ? 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 TONSILLECTOMY    ? ? ?There were no  vitals filed for this visit. ? ? Subjective Assessment - 02/11/22 0945   ? ? Subjective  Pt denies any pain this date, reports he still has episodes of dizziness and decreased balance, seeing PT   ? 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  Unable to confirm another stroke via MRI d/t ear implant.   ? Limitations R sided weakness, lack of coordination, impaired balance, slurred speech   ? Currently in Pain? No/denies   ? ?  ?  ? ?  ? ?OT TREATMENT   ?  ?Neuro muscular re-education: ?  ?Pt. performed right hand [facility] skills working on improving speed and dexterity needed for ADL tasks and writing. Pt. demonstrated grasping 1 inch sticks, ? inch cylindrical collars, and ? inch flat washers on the [place]ue pegboard. Pt. performed grasping each item with his 2nd digit and thumb in preparation for placing them onto the pegboard.  Pt. Attempted to perform translatory movements storing the objects in his hand, as well as moving the 1" sticks through his hand from his palm to the tip of his  2nd digit, and thumb. Pt. presented with difficulty storing ? inch objects at a time in the palmar aspect of the hand.  ?  ?Therapeutic Exercise: ?  ?Pt. performed  right gross gripping with a gross grip strengthener. Pt. worked on sustaining grip while grasping pegs and reaching at various heights. The Gripper was set to  17.9 # of grip strength resistance. Pt. Cleared the board x's 2.  ?  ?Pt. Continues to make progress overall, and was able to tolerate the grip strength set at 17.9# for increased reps to clear the board x's 2 today. Pt. Continues to present with difficulty grasping small objects reporting that his finger tips  are slick  today, and have felt slick that way a year ago. Pt. Was able to grasp and store multiple 1/2" washers, however was unable to move 1" sticks from her palm to the tip of her 2nd digit, and thumb. Pt. Continues to work on improving Right hand functioning order to work towards improving engagement in, and maximizing independence with ADLs, and overall IADL tasks.  ?  ?  ?  ?  ? ? ? ? ? ? ? ? ? ? ? ? ? ? ? ? ? ? ? ? ? OT Education - 02/11/22 0946   ? ? Education Details Right hand strength, and [facility] skills.   ? Person(s) Educated Patient;Spouse   ? Methods Explanation;Verbal cues   ? Comprehension Verbalized understanding;Need further instruction   ? ?  ?  ? ?  ? ? ? OT Short Term Goals - 02/01/22 1514   ? ?  ? OT SHORT TERM GOAL #1  ? Title Pt will perform HEP for increasing hand coordination with min vc.   ? Baseline Eval: initiated HEP at eval, further training needed   ? Time 3   ? Period Weeks   ? Status New   ? Target Date 02/20/22   ? ?  ?  ? ?  ? ? ? ? OT Long Term Goals - 02/01/22 1514   ? ?  ? OT LONG TERM GOAL #1  ? Title Pt will increase FOTO score by 2 or more points to indicate increased functional performance.   ? Baseline Eval: FOTO 60   ? Time 6   ? Period Weeks   ? Status New   ? Target Date 03/13/22   ?  ? OT LONG TERM GOAL #2  ? Title Pt will increase R hand coordination to manage eating utensils with good control.   ? Baseline Eval: Pt reports being clumsy with utensils   ? Time 6   ? Period Weeks   ? Status New   ? Target Date 03/13/22   ?  ? OT LONG TERM GOAL #3  ? Title Pt will increase R hand dexterity for better manipulation of ADL supplies as noted by <60 sec to complete 9 hole peg test.   ? Baseline Eval: R 1 min 7 sec, L 39 sec   ? Time 6   ? Period Weeks   ? Status New   ? Target Date 03/13/22   ?  ? OT LONG TERM GOAL #4  ? Title Pt will perform bathroom transfers with modified indep-indep.   ? Baseline Eval: spouse supervises all functional transfers; pt using RW for support.   ? Time 6   ?  Period Weeks   ? Status New   ? Target Date 03/13/22   ?  ? OT LONG TERM  GOAL #5  ? Title Pt will increase dynamic standing balance to enable pt to roll a light weight (5lbs or less) ball independently to work towards return to bowling.   ? Baseline Eval: unable to participate in bowling (pt's primary leisure activity)   ? Time 6   ? Period Weeks   ? Status New   ? Target Date 03/13/22   ? ?  ?  ? ?  ? ? ? ? ? ? ? ? Plan - 02/11/22 0947   ? ? Clinical Impression Statement Pt. Continues to make progress overall, and was able to tolerate the grip strength set at 17.9# for increased reps to clear the board x's 2 today. Pt. Continues to present with difficulty grasping small objects reporting that his finger tips  are slick today, and have felt slick that way a year ago. Pt. Was able to grasp and store multiple 1/2" washers, however was unable to move 1" sticks from her palm to the tip of her 2nd digit, and thumb. Pt. Continues to work on improving Right hand functioning order to work towards improving engagement in, and maximizing independence with ADLs, and overall IADL tasks. ?  ? OT Occupational Profile and History Problem Focused Assessment - Including review of records relating to presenting problem   ? Occupational performance deficits (Please refer to evaluation for details): ADL's;IADL's;Leisure   ? Body Structure / Function / Physical Skills ADL;Coordination;GMC;UE functional use;Balance;IADL;Dexterity;FMC;Mobility;Gait   ? Rehab Potential Good   ? Clinical Decision Making Limited treatment options, no task modification necessary   ? Comorbidities Affecting Occupational Performance: May have comorbidities impacting occupational performance   ? Modification or Assistance to Complete Evaluation  No modification of tasks or assist necessary to complete eval   ? OT Frequency 2x / week   ? OT Duration 6 weeks   ? OT Treatment/Interventions Self-care/ADL training;Therapeutic exercise;DME and/or AE  instruction;Functional Mobility Training;Balance training;Neuromuscular education;Therapeutic activities;Patient/family education   ? OT Home Exercise Plan initiated hand coordination exercises at eval   ? Consulted and A

## 2022-02-13 ENCOUNTER — Encounter: Payer: Self-pay | Admitting: Physical Therapy

## 2022-02-13 ENCOUNTER — Ambulatory Visit: Payer: Medicare Other

## 2022-02-13 ENCOUNTER — Ambulatory Visit: Payer: Medicare Other | Admitting: Physical Therapy

## 2022-02-13 ENCOUNTER — Ambulatory Visit: Payer: Medicare Other | Admitting: Speech Pathology

## 2022-02-13 ENCOUNTER — Other Ambulatory Visit: Payer: Self-pay

## 2022-02-13 DIAGNOSIS — R262 Difficulty in walking, not elsewhere classified: Secondary | ICD-10-CM

## 2022-02-13 DIAGNOSIS — R471 Dysarthria and anarthria: Secondary | ICD-10-CM | POA: Diagnosis not present

## 2022-02-13 DIAGNOSIS — R269 Unspecified abnormalities of gait and mobility: Secondary | ICD-10-CM

## 2022-02-13 DIAGNOSIS — R4701 Aphasia: Secondary | ICD-10-CM

## 2022-02-13 DIAGNOSIS — R278 Other lack of coordination: Secondary | ICD-10-CM

## 2022-02-13 DIAGNOSIS — R2681 Unsteadiness on feet: Secondary | ICD-10-CM

## 2022-02-13 NOTE — Therapy (Signed)
[place] [facility] ?[place], [place], [address] ?Phone: [phone]   Fax:  [phone] ? ?Physical Therapy Treatment ? ?Patient Details  ?Name: Casey Adkins ?MRN: [id number] ?Date of Birth: Jul 16, 1934 ?Referring Provider (PT): Dr. Emily Filbert ? ? ?Encounter Date: 02/13/2022 ? ? PT End of Session - 02/13/22 1154   ? ? Visit Number 4   ? Number of Visits 25   ? Date for PT Re-Evaluation 04/25/22   ? Authorization Time Period initial cert 12/04/5091- 12/31/7122   ? Progress Note Due on Visit 10   ? PT Start Time 1150   ? PT Stop Time 1230   ? PT Time Calculation (min) 40 min   ? Equipment Utilized During Treatment Gait belt   ? Activity Tolerance Patient tolerated treatment well   ? Behavior During Therapy [facility] for tasks assessed/performed   ? ?  ?  ? ?  ? ? ?Past Medical History:  ?Diagnosis Date  ? Allergic rhinitis   ? Arthritis   ? Cancer [facility])   ? Bladder Cancer  ? Cancer [facility])   ? Prostate Cancer  ? Diabetes mellitus without complication ([place])   ? Diverticulosis   ? Dysphagia   ? GERD (gastroesophageal reflux disease)   ? History of kidney stones   ? h/o  ? Hypertension   ? Stroke [facility]) 2006  ? possible light stroke-no deficits  ? ? ?Past Surgical History:  ?Procedure Laterality Date  ? CYSTOSCOPY W/ RETROGRADES Bilateral 06/07/2019  ? Procedure: CYSTOSCOPY WITH RETROGRADE PYELOGRAM;  Surgeon: Hollice Espy, MD;  Location: [facility] ORS;  Service: Urology;  Laterality: Bilateral;  ? CYSTOSCOPY WITH BIOPSY N/A 06/07/2019  ? Procedure: CYSTOSCOPY WITH Bladder BIOPSY;  Surgeon: Hollice Espy, MD;  Location: [facility] ORS;  Service: Urology;  Laterality: N/A;  ? ESOPHAGOGASTRODUODENOSCOPY N/A 04/21/2015  ? Procedure: ESOPHAGOGASTRODUODENOSCOPY (EGD);  Surgeon: Hulen Luster, MD;  Location: [facility] ENDOSCOPY;  Service: Gastroenterology;  Laterality: N/A;  ? LITHOTRIPSY    ? SINUS SURGERY WITH INSTATRAK  2008  ? STAPEDES SURGERY Left age 44  ? 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 TONSILLECTOMY    ? ? ?There were no vitals filed for this visit. ? ? Subjective Assessment - 02/13/22 1147   ? ? Subjective Pt caregiver reports she still feels he has room to improve on his balance. Pt and caregiver agree to continue with advanced HEP and be seen on once every week for advancement of HEP.   ? Patient is accompained by: Family member   ? Pertinent History Casey Adkins is an 88 y.o. male referred by his PCP Dr. Emily Filbert for weakness. Pt was admitted on 10/10/21- 10/11/21 for CVA. 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 Unable to obtain MRI due to ear implant. Hx of esophageal dysphagia requiring dilation in the past; reports 1 year hx of worsening dysphagia prior to CVA.   ? How long can you sit comfortably? No issues   ? How long can you stand comfortably? Not limited as long as holding onto something   ? How long can you walk comfortably? < 15 min   ? Patient Stated Goals Walk without losing my balance and no falls   ? ?  ?  ? ?  ? ? ? ? ?INTERVENTIONS:  ? ?Neuromuscular re-ed  ? ?Split stance balance without upper  extremity assist.  2 x 45 seconds bilaterally.  This was added to patient's home exercise program and handout provided ? ?Split stance anterior and posterior rocking with upper extremity support x12 on each lower extremity.  Increased difficulty with right lower extremity posterior.  This is added to patient's home exercise program and handout was provided ? ?Standing marching 2 sets of 10 repetitions on each side.  Decreased ability when on her right lower extremity for stance.  This is also added to patient's home exercise program and handouts provided ? ?Step to airex pad laterally 2 x 10 on R 1 x 10 on L, no UE support  ?-Decreased right lower extremity foot clearance when stepping with right foot, improved with practice on second set. ? ?Semitandem stance on Airex  pad 2 x 45 seconds with each lower extremity posterior ? ?Note: Portions of this document were prepared using Dragon voice recognition software and although reviewed may contain unintentional dictation errors in syntax, grammar, or spelling. ? ? ?Education provided throughout session via VC/TC and demonstration to facilitate movement at target joints and correct muscle activation for all testing and exercises performed.  ? ? ? ? ? ? ? ? ? ? ? ? ? ? ? ? ? ? ? ? ? ? ? ? ? ? PT Education - 02/13/22 1253   ? ? Education Details exercise technique   ? Person(s) Educated Patient   ? Methods Explanation;Demonstration   ? Comprehension Verbalized understanding;Returned demonstration;Verbal cues required   ? ?  ?  ? ?  ? ? ? PT Short Term Goals - 02/01/22 1215   ? ?  ? PT SHORT TERM GOAL #1  ? Title Pt will be independent with initial HEP in order to improve strength and balance in order to decrease fall risk and improve function at home and work.   ? Baseline Eval= No formal HEP in place   ? Time 6   ? Period Weeks   ? Status New   ? Target Date 03/14/22   ? ?  ?  ? ?  ? ? ? ? PT Long Term Goals - 02/01/22 1215   ? ?  ? PT LONG TERM GOAL #1  ? Title Pt will be independent with final HEP in order to improve strength and balance in order to decrease fall risk and improve function at home and work.   ? Baseline Eval= No formal HEP in place   ? Time 12   ? Period Weeks   ? Status New   ? Target Date 04/25/22   ?  ? PT LONG TERM GOAL #2  ? Title Pt will improve FOTO to target score of to display perceived improvements in ability to complete ADL's.   ? Baseline Eval= 60   ? Time 12   ? Period Weeks   ? Status New   ? Target Date 04/25/22   ?  ? PT LONG TERM GOAL #3  ? Title Pt will improve BERG by at least 3 points in order to demonstrate clinically significant improvement in balance.   ? Baseline Eval= 36/56   ? Time 12   ? Period Weeks   ? Status New   ? Target Date 04/25/22   ?  ? PT LONG TERM GOAL #4  ? Title Pt will decrease  5TSTS by at least 3 seconds in order to demonstrate clinically significant improvement in LE strength.   ? Baseline Eval= 23.3 sec with BUE Support   ?  Time 12   ? Period Weeks   ? Status New   ? Target Date 04/25/22   ?  ? PT LONG TERM GOAL #5  ? Title Pt will decrease TUG to below 20 seconds/decrease in order to demonstrate decreased fall risk.   ? Baseline Eval= 31.44 with use of RW   ? Time 12   ? Period Weeks   ? Status New   ? Target Date 04/25/22   ?  ? Additional Long Term Goals  ? Additional Long Term Goals Yes   ?  ? PT LONG TERM GOAL #6  ? Title Pt will increase 10MWT by at least 0.13 m/s in order to demonstrate clinically significant improvement in community ambulation.   ? Baseline Eval= 0.62 m/s using RW   ? Time 12   ? Period Weeks   ? Status New   ? Target Date 04/25/22   ? ?  ?  ? ?  ? ? ? ? ? ? ? ? Plan - 02/13/22 1254   ? ? Clinical Impression Statement Patient presents with fair motivation for completion of physical therapy session.  Patient and caregiver and physical therapist extensively discussed patient's continuation of care and agreed for patient to come in 1 time per week on the same day is seeing speech and occupational therapy while also continuing occupational therapy for 1 time a week (as discussed with occupational therapist) and will continue to see speech therapy 2 times per week as patient has to drive long distance to get to clinic due to living in [facility].  Patient was advanced with several home exercises in order to specifically target balance and stability with bowling related tasks.  Patient will continue to benefit from skilled physical therapy intervention in order to improve his lower extremity strength, balance, mobility, in order to allow him to return to his prior level of function and to activities was completing premorbidly.   ? Personal Factors and Comorbidities Age;Comorbidity 3+   ? Comorbidities Diabetes, CVA, HTN   ? Examination-Activity Limitations  Caring for Others;Lift;Squat;Stairs;Transfers   ? Examination-Participation Restrictions Cleaning;Community Activity;[place] Work   ? Stability/Clinical Decision Making Evolving/Moderate complexity   ? Re

## 2022-02-13 NOTE — Therapy (Signed)
[facility] ?[place], [place], [address] ?Phone: [phone]   Fax:  [phone] ? ?Occupational Therapy Treatment ? ?Patient Details  ?Name: Casey Adkins ?MRN: [id number] ?Date of Birth: 04-25-1934 ?Referring Provider (OT): Dr. Emily Filbert, PCP ? ? ?Encounter Date: 02/13/2022 ? ? OT End of Session - 02/13/22 1452   ? ? Visit Number 5   ? Number of Visits 12   ? Date for OT Re-Evaluation 03/13/22   ? Authorization Type Progress reporting period starting 01/31/2022   ? OT Start Time 1100   ? OT Stop Time 1145   ? OT Time Calculation (min) 45 min   ? Activity Tolerance Patient tolerated treatment well   ? Behavior During Therapy [facility] for tasks assessed/performed   ? ?  ?  ? ?  ? ? ?Past Medical History:  ?Diagnosis Date  ? Allergic rhinitis   ? Arthritis   ? Cancer [facility])   ? Bladder Cancer  ? Cancer [facility])   ? Prostate Cancer  ? Diabetes mellitus without complication ([place])   ? Diverticulosis   ? Dysphagia   ? GERD (gastroesophageal reflux disease)   ? History of kidney stones   ? h/o  ? Hypertension   ? Stroke [facility]) 2006  ? possible light stroke-no deficits  ? ? ?Past Surgical History:  ?Procedure Laterality Date  ? CYSTOSCOPY W/ RETROGRADES Bilateral 06/07/2019  ? Procedure: CYSTOSCOPY WITH RETROGRADE PYELOGRAM;  Surgeon: Hollice Espy, MD;  Location: [facility] ORS;  Service: Urology;  Laterality: Bilateral;  ? CYSTOSCOPY WITH BIOPSY N/A 06/07/2019  ? Procedure: CYSTOSCOPY WITH Bladder BIOPSY;  Surgeon: Hollice Espy, MD;  Location: [facility] ORS;  Service: Urology;  Laterality: N/A;  ? ESOPHAGOGASTRODUODENOSCOPY N/A 04/21/2015  ? Procedure: ESOPHAGOGASTRODUODENOSCOPY (EGD);  Surgeon: Hulen Luster, MD;  Location: [facility] ENDOSCOPY;  Service: Gastroenterology;  Laterality: N/A;  ? LITHOTRIPSY    ? SINUS SURGERY WITH INSTATRAK  2008  ? STAPEDES SURGERY Left age 66  ? 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 TONSILLECTOMY    ? ? ?There were no  vitals filed for this visit. ? ? Subjective Assessment - 02/13/22 1450   ? ? Subjective  Pt reports he plans to stop both PT and OT after next week.   ? 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  Unable to confirm another stroke via MRI d/t ear implant.   ? Limitations R sided weakness, lack of coordination, impaired balance, slurred speech   ? Patient Stated Goals To get back to bowling.   ? Currently in Pain? No/denies   ? Pain Score 0-No pain   ? Multiple Pain Sites No   ? ?  ?  ? ?  ? ?Occupational Therapy Treatment: ?Therapeutic Exercise: ?Instructed pt in shoulder extension and IR exercises for increasing RUE reach behind back d/t pt/spouse reporting increased difficulty with pt hiking his underwear or reaching in his back pocket for his wallet.  Pt performed cane stretches for shoulder ext and IR x2 sets 10 reps each, then returned demo end of session to test recall, with pt performing 2 of 3 exercises independently, 1 vc to recall the 3rd.  Pt  declined written handout and stated he or his spouse would remember.  Practiced functional ROM and coordination dropping 5 coins with R hand into top L shirt pocket, R front pants pocket, and back R pants pocket.  Pt successful with front shirt and pants pockets but required spouse's assist to retrieve from back pants pocket.  Practiced small item manipulation, gathering, storage, and discarding items from R hand.  Pt practiced with thin 1" sticks, picking up 1 at a time, rotating sticks 180 degrees within fingertips, then discarding sticks 1 at a time.  Pt required increased time, min vc for technique, and rest breaks when working to move sticks from palm of hand to  fingertips to discard.   ? ?Response to Treatment: ?Pt reports that he plans to finish OT and PT by the end of this month (end of next week) as he wants to work on his exercises on his own at home.  OT reviewed pt's goals and working towards increasing dexterity in R hand, as pt continues with mild deficits which impact ability to manipulate small items in the home.  Pt did report that he worked on fixing a clock this week which had tiny pieces, but that spouse had to assist.  OT also discussed goal made for increasing dynamic standing balance and working towards [employer] skills and balance required to enable pt to get back to bowling.  Pt acknowledged topics of discussion but pt seems to want to get through his visits next week and may make a decision at that time.  Pt will continue to benefit from skilled OT to address dynamic standing balance and coordination deficits which have contributed to functional decline and decreased engagement in leisure activities.  ? ? ? OT Education - 02/13/22 1451   ? ? Education Details Shoulder flexibility exercises to increase reach behind back for ADLs   ? Person(s) Educated Patient;Spouse   ? Methods Explanation;Verbal cues;Demonstration;Tactile cues   ? Comprehension Verbalized understanding;Need further instruction;Returned demonstration;Verbal cues required;Tactile cues required   ? ?  ?  ? ?  ? ? ? OT Short Term Goals - 02/01/22 1514   ? ?  ? OT SHORT TERM GOAL #1  ? Title Pt will perform HEP for increasing hand coordination with min vc.   ? Baseline Eval: initiated HEP at eval, further training needed   ? Time 3   ? Period Weeks   ? Status New   ? Target Date 02/20/22   ? ?  ?  ? ?  ? ? ? ? OT Long Term Goals - 02/01/22 1514   ? ?  ? OT LONG TERM GOAL #1  ? Title Pt will increase FOTO score by 2 or more points to indicate increased functional performance.   ? Baseline Eval: FOTO 60   ? Time 6   ? Period Weeks   ? Status New   ? Target Date 03/13/22   ?  ? OT LONG TERM GOAL #2  ?  Title Pt will increase R hand coordination to manage eating utensils with good control.   ? Baseline Eval: Pt reports being clumsy with utensils   ? Time 6   ? Period Weeks   ? Status New   ? Target Date 03/13/22   ?  ? OT LONG TERM GOAL #3  ? Title Pt will increase R hand dexterity for better manipulation of ADL supplies as noted by <60 sec to complete 9 hole peg test.   ? Baseline  Eval: R 1 min 7 sec, L 39 sec   ? Time 6   ? Period Weeks   ? Status New   ? Target Date 03/13/22   ?  ? OT LONG TERM GOAL #4  ? Title Pt will perform bathroom transfers with modified indep-indep.   ? Baseline Eval: spouse supervises all functional transfers; pt using RW for support.   ? Time 6   ? Period Weeks   ? Status New   ? Target Date 03/13/22   ?  ? OT LONG TERM GOAL #5  ? Title Pt will increase dynamic standing balance to enable pt to roll a light weight (5lbs or less) ball independently to work towards return to bowling.   ? Baseline Eval: unable to participate in bowling (pt's primary leisure activity)   ? Time 6   ? Period Weeks   ? Status New   ? Target Date 03/13/22   ? ?  ?  ? ?  ? ? ? ? Plan - 02/13/22 1644   ? ? Clinical Impression Statement Pt reports that he plans to finish OT and PT by the end of this month (end of next week) as he wants to work on his exercises on his own at home.  OT reviewed pt's goals and working towards increasing dexterity in R hand, as pt continues with mild deficits which impact ability to manipulate small items in the home.  Pt did report that he worked on fixing a clock this week which had tiny pieces, but that spouse had to assist.  OT also discussed goal made for increasing dynamic standing balance and working towards [employer] skills and balance required to enable pt to get back to bowling.  Pt acknowledged topics of discussion but pt seems to want to get through his visits next week and may make a decision at that time.  Pt will continue to benefit from skilled OT to address dynamic standing  balance and coordination deficits which have contributed to functional decline and decreased engagement in leisure activities.   ? OT Occupational Profile and History Problem Focused Assessment - Including

## 2022-02-13 NOTE — Therapy (Signed)
[facility] ?[place], [place], [address] ?Phone: [phone]   Fax:  [phone] ? ?Speech Language Pathology Treatment ? ?Patient Details  ?Name: Casey Adkins ?MRN: [id number] ?Date of Birth: 03-Jul-1934 ?Referring Provider (SLP): Rusty Aus, MD ? ? ?Encounter Date: 02/13/2022 ? ? End of Session - 02/13/22 1203   ? ? Visit Number 18   ? Number of Visits 37   ? Date for SLP Re-Evaluation 04/28/22   ? SLP Start Time 1005   ? SLP Stop Time  1100   ? SLP Time Calculation (min) 55 min   ? Activity Tolerance Treatment limited by reduced frustration tolerance; improves with task modification   ? ?  ?  ? ?  ? ? ?Past Medical History:  ?Diagnosis Date  ? Allergic rhinitis   ? Arthritis   ? Cancer [facility])   ? Bladder Cancer  ? Cancer [facility])   ? Prostate Cancer  ? Diabetes mellitus without complication ([place])   ? Diverticulosis   ? Dysphagia   ? GERD (gastroesophageal reflux disease)   ? History of kidney stones   ? h/o  ? Hypertension   ? Stroke [facility]) 2006  ? possible light stroke-no deficits  ? ? ?Past Surgical History:  ?Procedure Laterality Date  ? CYSTOSCOPY W/ RETROGRADES Bilateral 06/07/2019  ? Procedure: CYSTOSCOPY WITH RETROGRADE PYELOGRAM;  Surgeon: Hollice Espy, MD;  Location: [facility] ORS;  Service: Urology;  Laterality: Bilateral;  ? CYSTOSCOPY WITH BIOPSY N/A 06/07/2019  ? Procedure: CYSTOSCOPY WITH Bladder BIOPSY;  Surgeon: Hollice Espy, MD;  Location: [facility] ORS;  Service: Urology;  Laterality: N/A;  ? ESOPHAGOGASTRODUODENOSCOPY N/A 04/21/2015  ? Procedure: ESOPHAGOGASTRODUODENOSCOPY (EGD);  Surgeon: Hulen Luster, MD;  Location[facility] END[facility]COPY;  Service: Gastroenterology;  Laterality: N/A;  ? LITHOTRIPSY    ? SINUS SURGERY WITH INSTATRAK  2008  ? STAPEDES SURGERY Left age 56  ? 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 TONSILLECTOMY    ? ? ?There were no vitals filed for this visit. ? ? Subjective Assessment - 02/13/22 1116    ? ? Subjective "I don't need PT/OT"   ? Patient is accompained by: Family member   Spouse present for session  ? Currently in Pain? No/denies   ? Pain Score 0-No pain   ? ?  ?  ? ?  ? ? ? ? ? ? ? ? ADULT SLP TREATMENT - 02/13/22 1118   ? ?  ? General Information  ? Behavior/Cognition Alert;Cooperative;Pleasant mood   ? HPI Casey Adkins is an 88 y.o. male referred for MBS by his PCP Dr. Emily Filbert. Pt admitted 10/10/21- 10/11/21 for CVA. 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 Unable to obtain MRI due to ear implant. Hx of esophageal dysphagia requiring dilation in the past; reports 1 year hx of worsening dysphagia prior to CVA. Neurology noted right upper motor neuron pattern weakness, suspected small vessel lacunar infarction. 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 Wife assists with history given pt's dysarthria, aphasia.   ?  ? Treatment Provided  ? Treatment provided Cognitive-Linquistic   ?  ? Cognitive-Linquistic Treatment  ? Treatment focused on Dysarthria;Aphasia;Patient/family/caregiver education   ? Skilled Treatment Pt states he no longer wants to attend OT/PT sessions. Encouraged pt to talk with OT and PT to discuss concerns. Targeted dysarthria with multisyllabic  words. Pt intelligibility for 2-3 syllable words (/gl/ blends) was 70% accuracy, increased to 85% accuracy with min-mod cues to slow down and pronounce each syllable. Pt produced minimal pairs with word initial consonant voicing differences with 90% accuracy with min cues. Pt expressed frustration with dysarthria tasks. Educated pt on using dysarthria compensations to increase intelligibilty. Affirmed that learning and implementing skilled strategies can be challenging. Targeted naming/wordfinding with verbal description task (taboo). Pt initially had difficulty implementing compensations for anomia [employer]). Provided demonstration and education of SFA to aid in word-finding. Reduced task complexity, and after x3 demonstrations, pt was able to provide verbal descriptions with ~80% accuracy with mod cues to utilize semantic features (location, size/shape, use, association). Discussed using word-finding strategies to help overcome communication breakdowns and reduce frustration   ?  ? Assessment / Recommendations / Plan  ? Plan Continue with current plan of care   ?  ? Progression Toward Goals  ? Progression toward goals Progressing toward goals   ? ?  ?  ? ?  ? ? ? SLP Education - 02/13/22 1201   ? ? Education Details Reasoning for using compensations for word-finding and dysarthria   ? Person(s) Educated Patient;Spouse   ? Methods Explanation   ? Comprehension Verbalized understanding   ? ?  ?  ? ?  ? ? ? SLP Short Term Goals - 01/28/22 1014   ? ?  ? SLP SHORT TERM GOAL #1  ? Title Pt will use aphasia compensations during 5-8 minutes conversation with occasional min cues.   ? Time 10   ? Period --   sessions  ? Status Achieved   ?  ? SLP SHORT TERM GOAL #2  ? Title Pt will demonstrate dysarthria HEP with rare min cues.   ? Time 10   ? Period --   sessions  ? Status Achieved   ?  ? SLP SHORT TERM GOAL #3  ? Title Patient will use compensations for dysarthria in sentence responses for >95% intelligibility.   ? Time 10   ? Period --   sessions  ? Status Achieved   ?  ? SLP SHORT TERM GOAL #4  ? Title Pt will complete further assessment of cognitive-communication PRN.   ? Time 10   ? Period --   sessions  ? Status Achieved   ?  ? SLP SHORT TERM GOAL #5  ? Title Patient will use compensations for dysarthria in sentence-level reading for >95% intelligibility.   ? Time 10   ? Period --   sessions  ? Status Revised   revised after worsening symptoms, possible new CVA on 01/21/22  ?  ? SLP SHORT TERM GOAL #6  ? Title Patient will demonstrate alternating attention over 5 minutes between 2 simple  cognitive-linguistic task with 90% accuracy.   ? Time 10   ? Period --   sessions  ? Status Revised   revised after worsening symptoms, possible new CVA on 01/21/22  ? ?  ?  ? ?  ? ? ? SLP Long Term Goals - 01/28/22 1016   ? ?  ? SLP LONG TERM GOAL #1  ? Title Pt will use aphasia compensations effectively during 15 minutes mod complex conversation with modified independence.   ? Time 12   ? Period Weeks   ? Status Revised   revised after worsening symptoms, possible new CVA on 01/21/22  ?  ? SLP LONG TERM GOAL #2  ? Title Pt  will use aphasia compensations with modified independence to resolve communication breakdown at least 3 occasions outside of ST, per pt/spouse report.   ? Time 12   ? Period Weeks   ? Status On-going   ?  ? SLP LONG TERM GOAL #3  ? Title Patient will use compensations for dysarthria in 15 minute conversation for >95% intelligibility with no more than 2 repetitions necessary.   ? Time 12   ? Period Weeks   ? Status Revised   revised after worsening symptoms, possible new CVA on 01/21/22  ?  ? SLP LONG TERM GOAL #4  ? Title Patient will demonstrate swallowing precautions independently x3 sessions with no overt s/sx aspiration.   ? Time 12   ? Period Weeks   ? Status On-going   ? ?  ?  ? ?  ? ? ? Plan - 02/13/22 1204   ? ? Clinical Impression Statement Patient continues with moderate dysarthria, mild aphasia, and cognitive communication deficits, slightly worsened since hospitalization 01/21/22 for new stroke-like symptoms. Reduced frustration tolerance limited pt in initial therapy tasks, but improved/increased with task modification as well as education on rationale for tasks. Noted increasing intelligibility at word and sentence level. With repetition, pt implements compensations in structured tasks, but continues to require cues for consistency and carryover. I recommend continued skilled ST to improve intelligibility, verbal expression and swallowing safety necessary for increased communicatory  success and independence in the home and community environments.   ? Speech Therapy Frequency 2x / week   ? Duration 12 weeks   ? Treatment/Interventions Aspiration precaution training;Environmental controls;Language facilit

## 2022-02-15 ENCOUNTER — Encounter: Payer: Medicare Other | Admitting: Occupational Therapy

## 2022-02-15 ENCOUNTER — Ambulatory Visit: Payer: Medicare Other | Admitting: Physical Therapy

## 2022-02-18 ENCOUNTER — Ambulatory Visit: Payer: Medicare Other | Admitting: Speech Pathology

## 2022-02-18 ENCOUNTER — Other Ambulatory Visit: Payer: Self-pay

## 2022-02-18 DIAGNOSIS — R471 Dysarthria and anarthria: Secondary | ICD-10-CM | POA: Diagnosis not present

## 2022-02-18 DIAGNOSIS — R4701 Aphasia: Secondary | ICD-10-CM

## 2022-02-18 NOTE — Therapy (Signed)
[facility] ?[place], [place], [address] ?Phone: [phone]   Fax:  [phone] ? ?Speech Language Pathology Treatment ? ?Patient Details  ?Name: Casey Adkins ?MRN: [id number] ?Date of Birth: [birth year] ?Referring Provider (SLP): Casey Aus, MD ? ? ?Encounter Date: 02/18/2022 ? ? End of Session - 02/18/22 1141   ? ? Visit Number 19   ? Number of Visits 37   ? Date for SLP Re-Evaluation 04/28/22   ? SLP Start Time 1000   ? SLP Stop Time  1057   ? SLP Time Calculation (min) 57 min   ? Activity Tolerance Patient tolerated treatment well   ? ?  ?  ? ?  ? ? ?Past Medical History:  ?Diagnosis Date  ? Allergic rhinitis   ? Arthritis   ? Cancer [facility])   ? Bladder Cancer  ? Cancer [facility])   ? Prostate Cancer  ? Diabetes mellitus without complication ([place])   ? Diverticulosis   ? Dysphagia   ? GERD (gastroesophageal reflux disease)   ? History of kidney stones   ? h/o  ? Hypertension   ? Stroke [facility]) 2006  ? possible light stroke-no deficits  ? ? ?Past Surgical History:  ?Procedure Laterality Date  ? CYSTOSCOPY W/ RETROGRADES Bilateral 06/07/2019  ? Procedure: CYSTOSCOPY WITH RETROGRADE PYELOGRAM;  Surgeon: Hollice Espy, MD;  Location: [facility] ORS;  Service: Urology;  Laterality: Bilateral;  ? CYSTOSCOPY WITH BIOPSY N/A 06/07/2019  ? Procedure: CYSTOSCOPY WITH Bladder BIOPSY;  Surgeon: Hollice Espy, MD;  Location: [facility] ORS;  Service: Urology;  Laterality: N/A;  ? ESOPHAGOGASTRODUODENOSCOPY N/A 04/21/2015  ? Procedure: ESOPHAGOGASTRODUODENOSCOPY (EGD);  Surgeon: Hulen Luster, MD;  Location: [facility] ENDOSCOPY;  Service: Gastroenterology;  Laterality: N/A;  ? LITHOTRIPSY    ? SINUS SURGERY WITH INSTATRAK  2008  ? STAPEDES SURGERY Left age 71  ? 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 TONSILLECTOMY    ? ? ?There were no vitals filed for this visit. ? ? Subjective Assessment - 02/18/22 1141   ? ? Subjective Reports he is having a good day    ? Patient is accompained by: Family member   Spouse present for session  ? Currently in Pain? No/denies   ? Pain Score 0-No pain   ? ?  ?  ? ?  ? ? ? ? ? ? ? ? ADULT SLP TREATMENT - 02/18/22 1110   ? ?  ? General Information  ? Behavior/Cognition Alert;Cooperative;Pleasant mood   ? HPI Casey Adkins is an 88 y.o. male referred for MBS by his PCP Dr. Emily Adkins. Pt admitted 10/10/21- 10/11/21 for CVA. 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 Unable to obtain MRI due to ear implant. Hx of esophageal dysphagia requiring dilation in the past; reports 1 year hx of worsening dysphagia prior to CVA. Neurology noted right upper motor neuron pattern weakness, suspected small vessel lacunar infarction. 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 Wife assists with history given pt's dysarthria, aphasia.   ?  ? Treatment Provided  ? Treatment provided Cognitive-Linquistic   ?  ? Cognitive-Linquistic Treatment  ? Treatment focused on Dysarthria;Aphasia;Patient/family/caregiver education   ? Skilled Treatment Pt's spouse reports increased difficulty with word-finding this weekend. Targeted dysarthria at phrase and sentence level with functional reading task. Pt read items and ingredients from a menu  with 80% intelligibility, increased to 95% with min cues for dysarthria compensations. Throughout tasks, pt increased self-monitoring and repeated unintelligible speech when graduate clinician did not understand.  Targeted naming/wordfinding with verbal categorization task (5-second rule game). With modified task (eliminating time limit) and demonstration x2, pt listed 3-5 items per category with ~85% accuracy with min-mod cues to utilize word-finding strategies. Pt benefited from cues to attempt using semantic features instead of skipping task when anomia occurred.   ?  ? Assessment / Recommendations / Plan  ?  Plan Continue with current plan of care   ?  ? Progression Toward Goals  ? Progression toward goals Progressing toward goals   ? ?  ?  ? ?  ? ? ? SLP Education - 02/18/22 1147   ? ? Education Details Activities and strategies to target word-finding   ? Person(s) Educated Patient;Spouse   ? Methods Explanation   ? Comprehension Verbalized understanding   ? ?  ?  ? ?  ? ? ? SLP Short Term Goals - 01/28/22 1014   ? ?  ? SLP SHORT TERM GOAL #1  ? Title Pt will use aphasia compensations during 5-8 minutes conversation with occasional min cues.   ? Time 10   ? Period --   sessions  ? Status Achieved   ?  ? SLP SHORT TERM GOAL #2  ? Title Pt will demonstrate dysarthria HEP with rare min cues.   ? Time 10   ? Period --   sessions  ? Status Achieved   ?  ? SLP SHORT TERM GOAL #3  ? Title Patient will use compensations for dysarthria in sentence responses for >95% intelligibility.   ? Time 10   ? Period --   sessions  ? Status Achieved   ?  ? SLP SHORT TERM GOAL #4  ? Title Pt will complete further assessment of cognitive-communication PRN.   ? Time 10   ? Period --   sessions  ? Status Achieved   ?  ? SLP SHORT TERM GOAL #5  ? Title Patient will use compensations for dysarthria in sentence-level reading for >95% intelligibility.   ? Time 10   ? Period --   sessions  ? Status Revised   revised after worsening symptoms, possible new CVA on 01/21/22  ?  ? SLP SHORT TERM GOAL #6  ? Title Patient will demonstrate alternating attention over 5 minutes between 2 simple cognitive-linguistic task with 90% accuracy.   ? Time 10   ? Period --   sessions  ? Status Revised   revised after worsening symptoms, possible new CVA on 01/21/22  ? ?  ?  ? ?  ? ? ? SLP Long Term Goals - 01/28/22 1016   ? ?  ? SLP LONG TERM GOAL #1  ? Title Pt will use aphasia compensations effectively during 15 minutes mod complex conversation with modified independence.   ? Time 12   ? Period Weeks   ? Status Revised   revised after worsening symptoms, possible new  CVA on 01/21/22  ?  ? SLP LONG TERM GOAL #2  ? Title Pt will use aphasia compensations with modified independence to resolve communication breakdown at least 3 occasions outside of ST, per pt/spouse report.   ? Time 12   ? Period Weeks   ? Status On-going   ?  ? SLP LONG TERM GOAL #3  ? Title Patient will use compensations for dysarthria in 15 minute conversation  for >95% intelligibility with no more than 2 repetitions necessary.   ? Time 12   ? Period Weeks   ? Status Revised   revised after worsening symptoms, possible new CVA on 01/21/22  ?  ? SLP LONG TERM GOAL #4  ? Title Patient will demonstrate swallowing precautions independently x3 sessions with no overt s/sx aspiration.   ? Time 12   ? Period Weeks   ? Status On-going   ? ?  ?  ? ?  ? ? ? Plan - 02/18/22 1142   ? ? Clinical Impression Statement Patient continues with moderate dysarthria, mild aphasia, and cognitive communication deficits, slightly worsened since hospitalization 01/21/22 for new stroke-like symptoms. Pt increasingly using dysarthria compensations during structured tasks. Intelligibilty improving at word and sentence level.  Pt requires cues for consistent use of dysarthria and word-finding strategies in therapy tasks. I recommend continued skilled ST to improve intelligibility, verbal expression and swallowing safety necessary for increased communicatory success and independence in the home and community environments.   ? Speech Therapy Frequency 2x / week   ? Duration 12 weeks   ? Treatment/Interventions Aspiration precaution training;Environmental controls;Language facilitation;Cueing hierarchy;Oral motor exercises;SLP instruction and feedback;Pharyngeal strengthening exercises;Compensatory techniques;Cognitive reorganization;Functional tasks;Compensatory strategies;Diet toleration management by SLP;Internal/external aids;Multimodal communcation approach;Patient/family education   ? Potential to Achieve Goals Good   ? Consulted and Agree  with Plan of Care Family member/caregiver;Patient   ? ?  ?  ? ?  ? ? ?Patient will benefit from skilled therapeutic intervention in order to improve the following deficits and impairments:   ?No diagnosi

## 2022-02-20 ENCOUNTER — Ambulatory Visit: Payer: Medicare Other | Admitting: Speech Pathology

## 2022-02-20 ENCOUNTER — Ambulatory Visit: Payer: Medicare Other | Admitting: Physical Therapy

## 2022-02-20 ENCOUNTER — Other Ambulatory Visit: Payer: Self-pay

## 2022-02-20 ENCOUNTER — Ambulatory Visit: Payer: Medicare Other | Admitting: Occupational Therapy

## 2022-02-20 DIAGNOSIS — R269 Unspecified abnormalities of gait and mobility: Secondary | ICD-10-CM

## 2022-02-20 DIAGNOSIS — R262 Difficulty in walking, not elsewhere classified: Secondary | ICD-10-CM

## 2022-02-20 DIAGNOSIS — M6281 Muscle weakness (generalized): Secondary | ICD-10-CM

## 2022-02-20 DIAGNOSIS — R471 Dysarthria and anarthria: Secondary | ICD-10-CM | POA: Diagnosis not present

## 2022-02-20 DIAGNOSIS — R2681 Unsteadiness on feet: Secondary | ICD-10-CM

## 2022-02-20 DIAGNOSIS — R278 Other lack of coordination: Secondary | ICD-10-CM

## 2022-02-20 DIAGNOSIS — R4701 Aphasia: Secondary | ICD-10-CM

## 2022-02-20 NOTE — Therapy (Signed)
[place] [facility] ?[place], [place], [address] ?Phone: [phone]   Fax:  [phone] ? ?Physical Therapy Treatment ? ?Patient Details  ?Name: Casey Adkins ?MRN: [id number] ?Date of Birth: Aug 14, 1934 ?Referring Provider (PT): Dr. Emily Filbert ? ? ?Encounter Date: 02/20/2022 ? ? PT End of Session - 02/20/22 1001   ? ? Visit Number 5   ? Number of Visits 25   ? Date for PT Re-Evaluation 04/25/22   ? Authorization Time Period initial cert 04/25/9508- 01/25/6711   ? Progress Note Due on Visit 10   ? Equipment Utilized During Treatment Gait belt   ? Activity Tolerance Patient tolerated treatment well   ? Behavior During Therapy [facility] for tasks assessed/performed   ? ?  ?  ? ?  ? ? ?Past Medical History:  ?Diagnosis Date  ? Allergic rhinitis   ? Arthritis   ? Cancer [facility])   ? Bladder Cancer  ? Cancer [facility])   ? Prostate Cancer  ? Diabetes mellitus without complication ([place])   ? Diverticulosis   ? Dysphagia   ? GERD (gastroesophageal reflux disease)   ? History of kidney stones   ? h/o  ? Hypertension   ? Stroke [facility]) 2006  ? possible light stroke-no deficits  ? ? ?Past Surgical History:  ?Procedure Laterality Date  ? CYSTOSCOPY W/ RETROGRADES Bilateral 06/07/2019  ? Procedure: CYSTOSCOPY WITH RETROGRADE PYELOGRAM;  Surgeon: Hollice Espy, MD;  Location: [facility] ORS;  Service: Urology;  Laterality: Bilateral;  ? CYSTOSCOPY WITH BIOPSY N/A 06/07/2019  ? Procedure: CYSTOSCOPY WITH Bladder BIOPSY;  Surgeon: Hollice Espy, MD;  Location: [facility] ORS;  Service: Urology;  Laterality: N/A;  ? ESOPHAGOGASTRODUODENOSCOPY N/A 04/21/2015  ? Procedure: ESOPHAGOGASTRODUODENOSCOPY (EGD);  Surgeon: Hulen Luster, MD;  Location: [facility] ENDOSCOPY;  Service: Gastroenterology;  Laterality: N/A;  ? LITHOTRIPSY    ? SINUS SURGERY WITH INSTATRAK  2008  ? STAPEDES SURGERY Left age 13  ? 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 TONSILLECTOMY    ? ? ?There were no vitals filed  for this visit. ? ? Subjective Assessment - 02/20/22 1000   ? ? Subjective Pt reports going to eye doctor and states his macular degeneration has progressed and he will require new glasses to accommodate for this.   ? Pertinent History [place] Casey Adkins is an 88 y.o. male referred by his PCP Dr. Emily Filbert for weakness. Pt was admitted on 10/10/21- 10/11/21 for CVA. 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 Unable to obtain MRI due to ear implant. Hx of esophageal dysphagia requiring dilation in the past; reports 1 year hx of worsening dysphagia prior to CVA.   ? How long can you sit comfortably? No issues   ? How long can you stand comfortably? Not limited as long as holding onto something   ? How long can you walk comfortably? < 15 min   ? Patient Stated Goals Walk without losing my balance and no falls   ? Currently in Pain? No/denies   ? ?  ?  ? ?  ? ? ? ? ?INTERVENTIONS:  ? ?Neuromuscular re-ed  ? ?Split stance balance with Airex pad anteriorly without upper extremity assist.  2 x 45 seconds bilaterally.  ? ?Split stance anterior and posterior rocking with with Airex pad anterior x45 seconds on each lower extremity.  Increased difficulty with right  lower extremity posterior.   ? ?Bowling practice walking in narrow lean x10 m holding 3 kg medicine ball and hands and imitating bowling stance near end of walkway.  Patient difficulty with sequencing and was unable to correctly step at end for proper bowling form.  Completed this 3 sets x4 links of walkway. ?-When adding Airex pad at the end for patient to step to patient unable to correctly sequence this and only able to get anterior half of foot on Airex pad with each rep. ? ?Anterior step to Thera-Band pad for external cue for foot placement.  Cues for proper foot placement as patient tends to step on medial side of pad without full foot contact.  Times several reps ? ?Anterior step taps  with cues for patient to put full foot on step.  Patient tends to only place anterior portion of lower extremity on step x10 reps each side.  ? ?Step to airex pad laterally 2 x 10 on R 1 x 10 on L, no UE support  ?-Decreased right lower extremity foot clearance when stepping with right foot, improved with practice on second set. ? ? ? ?Note: Portions of this document were prepared using Dragon voice recognition software and although reviewed may contain unintentional dictation errors in syntax, grammar, or spelling. ? ? ?Education provided throughout session via VC/TC and demonstration to facilitate movement at target joints and correct muscle activation for all testing and exercises performed.  ? ? ? ? ? ? ? ? ? ? ? ? ? ? ? ? ? ? ? ? ? ? ? ? ? ? ? ? PT Education - 02/20/22 1001   ? ? Education Details exercise technique and purpose   ? Person(s) Educated Patient   ? Methods Explanation;Demonstration   ? Comprehension Verbalized understanding;Returned demonstration;Verbal cues required   ? ?  ?  ? ?  ? ? ? PT Short Term Goals - 02/01/22 1215   ? ?  ? PT SHORT TERM GOAL #1  ? Title Pt will be independent with initial HEP in order to improve strength and balance in order to decrease fall risk and improve function at home and work.   ? Baseline Eval= No formal HEP in place   ? Time 6   ? Period Weeks   ? Status New   ? Target Date 03/14/22   ? ?  ?  ? ?  ? ? ? ? PT Long Term Goals - 02/01/22 1215   ? ?  ? PT LONG TERM GOAL #1  ? Title Pt will be independent with final HEP in order to improve strength and balance in order to decrease fall risk and improve function at home and work.   ? Baseline Eval= No formal HEP in place   ? Time 12   ? Period Weeks   ? Status New   ? Target Date 04/25/22   ?  ? PT LONG TERM GOAL #2  ? Title Pt will improve FOTO to target score of to display perceived improvements in ability to complete ADL's.   ? Baseline Eval= 60   ? Time 12   ? Period Weeks   ? Status New   ? Target Date 04/25/22    ?  ? PT LONG TERM GOAL #3  ? Title Pt will improve BERG by at least 3 points in order to demonstrate clinically significant improvement in balance.   ? Baseline Eval= 36/56   ? Time 12   ?  Period Weeks   ? Status New   ? Target Date 04/25/22   ?  ? PT LONG TERM GOAL #4  ? Title Pt will decrease 5TSTS by at least 3 seconds in order to demonstrate clinically significant improvement in LE strength.   ? Baseline Eval= 23.3 sec with BUE Support   ? Time 12   ? Period Weeks   ? Status New   ? Target Date 04/25/22   ?  ? PT LONG TERM GOAL #5  ? Title Pt will decrease TUG to below 20 seconds/decrease in order to demonstrate decreased fall risk.   ? Baseline Eval= 31.44 with use of RW   ? Time 12   ? Period Weeks   ? Status New   ? Target Date 04/25/22   ?  ? Additional Long Term Goals  ? Additional Long Term Goals Yes   ?  ? PT LONG TERM GOAL #6  ? Title Pt will increase 10MWT by at least 0.13 m/s in order to demonstrate clinically significant improvement in community ambulation.   ? Baseline Eval= 0.62 m/s using RW   ? Time 12   ? Period Weeks   ? Status New   ? Target Date 04/25/22   ? ?  ?  ? ?  ? ? ? ? ? ? ? ? Plan - 02/20/22 1002   ? ? Clinical Impression Statement Patient presents with fair motivation for completion of physical therapy session. Patient was advanced with several home exercises in order to specifically target balance and stability with bowling related tasks. Patient will continue to benefit from skilled physical therapy intervention in order to improve his lower extremity strength, balance, mobility, in order to allow him to return to his prior level of function and to activities was completing premorbidly.   ? Personal Factors and Comorbidities Age;Comorbidity 3+   ? Comorbidities Diabetes, CVA, HTN   ? Examination-Activity Limitations Caring for Others;Lift;Squat;Stairs;Transfers   ? Examination-Participation Restrictions Cleaning;Community Activity;[place] Work   ? Stability/Clinical Decision Making  Evolving/Moderate complexity   ? Rehab Potential Good   ? PT Frequency 2x / week   ? PT Duration 12 weeks   ? PT Treatment/Interventions ADLs/Self Care Home Management;Canalith Repostioning;Cryotherapy;

## 2022-02-20 NOTE — Therapy (Signed)
[place] [facility] ?[place], [place], [address] ?Phone: [phone]   Fax:  [phone] ? ?Speech Language Pathology Treatment and Progress Note ? ?Patient Details  ?Name: Casey Adkins ?MRN: [id number] ?Date of Birth: September 30, 1934 ?Referring Provider (SLP): Rusty Aus, MD ? ?Speech Therapy Progress Note ? ?Dates of Reporting Period: 01/10/2022 to 02/20/2022 ? ?Objective: Patient has been seen for 10 speech therapy sessions this reporting period targeting dysarthria and aphasia. Patient is making progress toward LTGs. He partially met 1 STG this reporting period. Goal for alternating attention deferred as pt prefers to focus on intelligibility and verbal expression at this time. LTGs revised due to worsening symptoms after ED admission 01/21/22. See skilled intervention, clinical impressions, and goals below for details. ? ? ?Encounter Date: 02/20/2022 ? ? End of Session - 02/20/22 1709   ? ? Visit Number 20   ? Number of Visits 37   ? Date for SLP Re-Evaluation 04/28/22   ? SLP Start Time 1003   ? SLP Stop Time  1100   ? SLP Time Calculation (min) 57 min   ? Activity Tolerance Patient tolerated treatment well   ? ?  ?  ? ?  ? ? ?Past Medical History:  ?Diagnosis Date  ? Allergic rhinitis   ? Arthritis   ? Cancer [facility])   ? Bladder Cancer  ? Cancer [facility])   ? Prostate Cancer  ? Diabetes mellitus without complication ([place])   ? Diverticulosis   ? Dysphagia   ? GERD (gastroesophageal reflux disease)   ? History of kidney stones   ? h/o  ? Hypertension   ? Stroke [facility]) 2006  ? possible light stroke-no deficits  ? ? ?Past Surgical History:  ?Procedure Laterality Date  ? CYSTOSCOPY W/ RETROGRADES Bilateral 06/07/2019  ? Procedure: CYSTOSCOPY WITH RETROGRADE PYELOGRAM;  Surgeon: Hollice Espy, MD;  Location: [facility] ORS;  Service: Urology;  Laterality: Bilateral;  ? CYSTOSCOPY WITH BIOPSY N/A 06/07/2019  ? Procedure: CYSTOSCOPY WITH Bladder BIOPSY;  Surgeon:  Hollice Espy, MD;  Location: [facility] ORS;  Service: Urology;  Laterality: N/A;  ? ESOPHAGOGASTRODUODENOSCOPY N/A 04/21/2015  ? Procedure: ESOPHAGOGASTRODUODENOSCOPY (EGD);  Surgeon: Hulen Luster, MD;  Location: [facility] ENDOSCOPY;  Service: Gastroenterology;  Laterality: N/A;  ? LITHOTRIPSY    ? SINUS SURGERY WITH INSTATRAK  2008  ? STAPEDES SURGERY Left age 3  ? 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 TONSILLECTOMY    ? ? ?There were no vitals filed for this visit. ? ? Subjective Assessment - 02/20/22 1519   ? ? Subjective "I'm doing alright"   ? Patient is accompained by: Family member   Spouse present for session  ? Currently in Pain? No/denies   ? Pain Score 0-No pain   ? ?  ?  ? ?  ? ? ? ? ? ? ? ? ADULT SLP TREATMENT - 02/20/22 1533   ? ?  ? General Information  ? Behavior/Cognition Alert;Cooperative;Pleasant mood   ? HPI Casey Adkins is an 88 y.o. male referred for MBS by his PCP Dr. Emily Filbert. Pt admitted 10/10/21- 10/11/21 for CVA. 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 Unable to obtain MRI due to ear implant. Hx of esophageal dysphagia requiring dilation in the past; reports 1 year hx of worsening dysphagia prior to CVA. Neurology noted right upper motor neuron pattern weakness,  suspected small vessel lacunar infarction. 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 Wife assists with history given pt's dysarthria, aphasia.   ?  ? Treatment Provided  ? Treatment provided Cognitive-Linquistic   ?  ? Cognitive-Linquistic Treatment  ? Treatment focused on Dysarthria;Aphasia;Patient/family/caregiver education   ? Skilled Treatment Pt purchased taboo game after previous session and brought it to today's session. Reviewed pt's HEP plan and use of dysarthria compensations (SLOP strategies). Pt completed HEP with ~90% intelligibility with min cues. He required cues to slow  down and repeat unintelligible speech. Targeted dysarthria at phrase/sentence level with functional reading task. Pt read menu items with 85% intelligibility with min-mod cues to slow down, pause, and swallow saliva. Pt's spouse reports noticing increased drooling since most recent hospitalization. Targeted naming/word-finding with verbal description task (taboo). After demonstration x2, Pt provided verbal descriptions with ~70% accuracy, increased to 90% accuracy with mod cues for semantic features. Targeted using semantic feature analysis during "Tell me more" TalkPath activity. Pt provided semantic features (use, location, category,association) for common objects with ~80% acc with min-mod cues to provide more details.   ?  ? Assessment / Recommendations / Plan  ? Plan Goals updated   ?  ? Progression Toward Goals  ? Progression toward goals Progressing toward goals   ? ?  ?  ? ?  ? ? ? SLP Education - 02/20/22 1707   ? ? Education Details Dysarthria and word-finding compensations   ? Person(s) Educated Patient   ? Methods Explanation   ? Comprehension Verbalized understanding   ? ?  ?  ? ?  ? ? ? SLP Short Term Goals - 02/20/22 1726   ? ?  ? SLP SHORT TERM GOAL #1  ? Title Pt will use aphasia compensations during 5-8 minutes conversation with occasional min cues.   ? Time 10   ? Period --   sessions  ? Status Achieved   ?  ? SLP SHORT TERM GOAL #2  ? Title Pt will demonstrate dysarthria HEP with rare min cues.   ? Time 10   ? Period --   sessions  ? Status Achieved   ?  ? SLP SHORT TERM GOAL #3  ? Title Patient will use compensations for dysarthria in sentence responses for >95% intelligibility.   ? Time 10   ? Period --   sessions  ? Status Achieved   ?  ? SLP SHORT TERM GOAL #4  ? Title Pt will complete further assessment of cognitive-communication PRN.   ? Time 10   ? Period --   sessions  ? Status Achieved   ?  ? SLP SHORT TERM GOAL #5  ? Title Patient will use compensations for dysarthria in sentence-level  reading for >95% intelligibility.   ? Time 10   ? Period --   sessions  ? Status Partially Met   revised after worsening symptoms, possible new CVA on 01/21/22  ?  ? SLP SHORT TERM GOAL #6  ? Title Patient will demonstrate alternating attention over 5 minutes between 2 simple cognitive-linguistic task with 90% accuracy.   ? Time 10   ? Period --   sessions  ? Status Deferred   Deferred due to patient's preference for focus on intelligibility  ? ?  ?  ? ?  ? ? ? SLP Long Term Goals - 02/20/22 1730   ? ?  ? SLP LONG TERM GOAL #1  ? Title Pt will use aphasia compensations  effectively during 15 minutes mod complex conversation with modified independence.   ? Time 12   ? Period Weeks   ? Status Revised   revised after worsening symptoms, possible new CVA on 01/21/22  ?  ? SLP LONG TERM GOAL #2  ? Title Pt will use aphasia compensations with modified independence to resolve communication breakdown at least 3 occasions outside of ST, per pt/spouse report.   ? Time 12   ? Period Weeks   ? Status On-going   ?  ? SLP LONG TERM GOAL #3  ? Title Patient will use compensations for dysarthria in 10 minute conversation for >80% intelligibility with no more than 2 repetitions necessary.   ? Time 12   ? Period Weeks   ? Status Revised   Revised due to progress/pt's demands  ?  ? SLP LONG TERM GOAL #4  ? Title Patient will demonstrate swallowing precautions independently x3 sessions with no overt s/sx aspiration.   ? Time 12   ? Period Weeks   ? Status On-going   ? ?  ?  ? ?  ? ? ? Plan - 02/20/22 1712   ? ? Clinical Impression Statement Patient continues with moderate dysarthria, mild aphasia, and cognitive communication deficits, slightly worsened since hospitalization 01/21/22 for new stroke-like symptoms. Pt was engaged and higly motivated when discussing taboo game he purchased to practice using trained word-finding strategies. Pt intelligibility is improving at word and sentence level, but requires cues for consistent usuage of  dysarthria strategies (SLOP). Goals updated and revised today based on pt's progress and to align with pt's demands outside of ST. Plan to continue targeting dysarthria and aphasia compensations. I recommend con

## 2022-02-21 ENCOUNTER — Encounter: Payer: Self-pay | Admitting: Physical Therapy

## 2022-02-22 ENCOUNTER — Ambulatory Visit: Payer: Medicare Other

## 2022-02-23 ENCOUNTER — Encounter: Payer: Self-pay | Admitting: Occupational Therapy

## 2022-02-23 NOTE — Therapy (Signed)
[place] [facility] ?[place], [place], [address] ?Phone: [phone]   Fax:  [phone] ? ?Occupational Therapy Treatment ? ?Patient Details  ?Name: DALLON DACOSTA ?MRN: [id number] ?Date of Birth: July 06, 1934 ?Referring Provider (OT): Dr. Emily Filbert, PCP ? ? ?Encounter Date: 02/20/2022 ? ? OT End of Session - 02/23/22 1705   ? ? Visit Number 6   ? Number of Visits 12   ? Date for OT Re-Evaluation 03/13/22   ? Authorization Type Progress reporting period starting 01/31/2022   ? OT Start Time 1100   ? OT Stop Time 1145   ? OT Time Calculation (min) 45 min   ? Activity Tolerance Patient tolerated treatment well   ? Behavior During Therapy [facility] for tasks assessed/performed   ? ?  ?  ? ?  ? ? ?Past Medical History:  ?Diagnosis Date  ? Allergic rhinitis   ? Arthritis   ? Cancer [facility])   ? Bladder Cancer  ? Cancer [facility])   ? Prostate Cancer  ? Diabetes mellitus without complication ([place])   ? Diverticulosis   ? Dysphagia   ? GERD (gastroesophageal reflux disease)   ? History of kidney stones   ? h/o  ? Hypertension   ? Stroke [facility]) 2006  ? possible light stroke-no deficits  ? ? ?Past Surgical History:  ?Procedure Laterality Date  ? CYSTOSCOPY W/ RETROGRADES Bilateral 06/07/2019  ? Procedure: CYSTOSCOPY WITH RETROGRADE PYELOGRAM;  Surgeon: Hollice Espy, MD;  Location: [facility] ORS;  Service: Urology;  Laterality: Bilateral;  ? CYSTOSCOPY WITH BIOPSY N/A 06/07/2019  ? Procedure: CYSTOSCOPY WITH Bladder BIOPSY;  Surgeon: Hollice Espy, MD;  Location: [facility] ORS;  Service: Urology;  Laterality: N/A;  ? ESOPHAGOGASTRODUODENOSCOPY N/A 04/21/2015  ? Procedure: ESOPHAGOGASTRODUODENOSCOPY (EGD);  Surgeon: Hulen Luster, MD;  Location: [facility] ENDOSCOPY;  Service: Gastroenterology;  Laterality: N/A;  ? LITHOTRIPSY    ? SINUS SURGERY WITH INSTATRAK  2008  ? STAPEDES SURGERY Left age 43  ? 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 TONSILLECTOMY    ? ? ?There were no  vitals filed for this visit. ? ? Subjective Assessment - 02/23/22 1704   ? ? Subjective  Pt reports he feels funny today, not sure why and no specific complaints, denies any headache or pain, reports he had his eyes dilated yesterday and since that time he has feel ?funny?.  Denies any dizziness.   ? 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  Unable to confirm another stroke via MRI d/t ear implant.   ? Limitations R sided weakness, lack of coordination, impaired balance, slurred speech   ? Patient Stated Goals To get back to bowling.   ? Currently in Pain? No/denies   ? Pain Score 0-No pain   ? ?  ?  ? ?  ? ? ? ?Pt reports he feels funny today, not sure why and no specific complaints, denies any headache or pain, reports he had his eyes dilated yesterday and since that time he has feel ?funny?.  Denies any dizziness. ?Therapist administered vital signs this date based on patient complaints: ?BP 144/74, 61 pulse, 99 O2 sats ? ?Therapeutic exercises: ?Pt seen  for wrist exercises with 2# weight for wrist flexion/extension, radial and ulnar deviation for 2 sets of 10 reps each with cues for proper form and technique.  Tendon gliding exercises bilaterally with therapist demo and cues, both verbal and tactile.  Shoulder exercises with 1# dowel for shoulder flexion, ABD, ADD, chest press, forwards and backwards circles, dowel climbing with dowel placed on table in vertical position with hand over hand climb.  All exercises performed for 2 sets of 10 reps with short rest breaks as needed.   ? ?ADL: ?Pt seen for task of opening pill bottles with a variety of types of tops, sizes and including those with child safety locks.  Pt  able to manipulate all tops with increased effort and occasional cues for opening technique.  Pt picking up and manipulating simulated large pills and placing into container followed by manipulation of smaller pills (representative of the size pills he has at home), pt able to complete with dropping 1-2 items and reports some items felt ?slick? when picking up.  Wife reports pt was able to sort his pill box this week and did not drop any this time.  ? ?Response to tx: ?Pt reports feeling "funny" today after his eye appt yesterday and having eyes dilated.  Pt responded well to cues for exercises for proper form and technique.  Therapist provided tactile cues as needed for tendon gliding exercises.  Pt benefits from short rest breaks during exercises, increased pain at times after repeated use.  Pt demonstrates difficulty with using the hand for storage and dropping items from ulnar side of the hand.  Continue to work towards goals in plan of care to maximize safety and independence in necessary daily tasks.  ? ? ? ? ? ? ? ? ? ? ? ? ? ? ? ? ? ? ? OT Education - 02/23/22 1705   ? ? Education Details Shoulder flexibility exercises, ROM, coordination   ? Person(s) Educated Patient;Spouse   ? Methods Explanation;Verbal cues;Demonstration;Tactile cues   ? Comprehension Verbalized understanding;Need further instruction;Returned demonstration;Verbal cues required;Tactile cues required   ? ?  ?  ? ?  ? ? ? OT Short Term Goals - 02/01/22 1514   ? ?  ? OT SHORT TERM GOAL #1  ? Title Pt will perform HEP for increasing hand coordination with min vc.   ? Baseline Eval: initiated HEP at eval, further training needed   ? Time 3   ? Period Weeks   ? Status New   ? Target Date 02/20/22   ? ?  ?  ? ?  ? ? ? ? OT Long Term Goals - 02/01/22 1514   ? ?  ? OT LONG TERM GOAL #1  ? Title Pt will increase FOTO score by 2 or more points to indicate increased functional performance.   ? Baseline Eval: FOTO 60   ? Time 6   ? Period Weeks   ?  Status New   ? Target Date 03/13/22   ?  ? OT LONG TERM GOAL #2  ? Title Pt will increase R hand coordination to manage eating utensils with good control.   ? Baseline Eval: Pt reports being clumsy with utensils   ? Time 6   ? Period Weeks   ? Status New   ? Target Date 03/13/22   ?  ? OT LONG TERM GOAL #3  ? Title Pt will increase R hand dexterity for better manipulation of ADL supplies as noted by <  60 sec to complete 9 hole peg test.   ? Baseline Eval: R 1 min 7 sec, L 39 sec   ? Time 6   ? Period Weeks   ? Status New   ? Target Date 03/13/22   ?  ? OT LONG TERM GOAL #4  ? Title Pt will perform bathroom transfers with modified indep-indep.   ? Baseline Eval: spouse supervises all functional transfers; pt using RW for support.   ? Time 6   ? Period Weeks   ? Status New   ? Target Date 03/13/22   ?  ? OT LONG TERM GOAL #5  ? Title Pt will increase dynamic standing balance to enable pt to roll a light weight (5lbs or less) ball independently to work towards return to bowling.   ? Baseline Eval: unable to participate in bowling (pt's primary leisure activity)   ? Time 6   ? Period Weeks   ? Status New   ? Target Date 03/13/22   ? ?  ?  ? ?  ? ? ? ? ? ? ? ? Plan - 02/23/22 1706   ? ? Clinical Impression Statement Pt reports feeling "funny" today after his eye appt yesterday and having eyes dilated.  Pt responded well to cues for exercises for proper form and technique.  Therapist provided tactile cues as needed for tendon gliding exercises.  Pt benefits from short rest breaks during exercises, increased pain at times after repeated use.  Pt demonstrates difficulty with using the hand for storage and dropping items from ulnar side of the hand.  Continue to work towards goals in plan of care to maximize safety and independence in necessary daily tasks.   ? OT Occupational Profile and History Problem Focused Assessment - Including review of records relating to presenting problem   ? Occupational performance deficits  (Please refer to evaluation for details): ADL's;IADL's;Leisure   ? Body Structure / Function / Physical Skills ADL;Coordination;GMC;UE functional use;Balance;IADL;Dexterity;FMC;Mobility;Gait   ? Rehab Potent

## 2022-02-25 ENCOUNTER — Ambulatory Visit: Payer: Medicare Other | Attending: Internal Medicine | Admitting: Speech Pathology

## 2022-02-25 DIAGNOSIS — R262 Difficulty in walking, not elsewhere classified: Secondary | ICD-10-CM | POA: Insufficient documentation

## 2022-02-25 DIAGNOSIS — R4701 Aphasia: Secondary | ICD-10-CM | POA: Diagnosis not present

## 2022-02-25 DIAGNOSIS — M6281 Muscle weakness (generalized): Secondary | ICD-10-CM | POA: Diagnosis present

## 2022-02-25 DIAGNOSIS — R2681 Unsteadiness on feet: Secondary | ICD-10-CM | POA: Diagnosis present

## 2022-02-25 DIAGNOSIS — R471 Dysarthria and anarthria: Secondary | ICD-10-CM | POA: Insufficient documentation

## 2022-02-25 DIAGNOSIS — R269 Unspecified abnormalities of gait and mobility: Secondary | ICD-10-CM | POA: Insufficient documentation

## 2022-02-25 DIAGNOSIS — R278 Other lack of coordination: Secondary | ICD-10-CM | POA: Insufficient documentation

## 2022-02-25 NOTE — Therapy (Signed)
[facility] ?[place], [place], [address] ?Phone: [phone]   Fax:  [phone] ? ?Speech Language Pathology Treatment ? ?Patient Details  ?Name: Casey Adkins ?MRN: [id number] ?Date of Birth: 03-23-1934 ?Referring Provider (SLP): Rusty Aus, MD ? ? ?Encounter Date: 02/25/2022 ? ? End of Session - 02/25/22 1307   ? ? Visit Number 21   ? Number of Visits 37   ? Date for SLP Re-Evaluation 04/28/22   ? SLP Start Time 1004   ? SLP Stop Time  1058   ? SLP Time Calculation (min) 54 min   ? Activity Tolerance Patient tolerated treatment well   ? ?  ?  ? ?  ? ? ?Past Medical History:  ?Diagnosis Date  ? Allergic rhinitis   ? Arthritis   ? Cancer [facility])   ? Bladder Cancer  ? Cancer [facility])   ? Prostate Cancer  ? Diabetes mellitus without complication ([place])   ? Diverticulosis   ? Dysphagia   ? GERD (gastroesophageal reflux disease)   ? History of kidney stones   ? h/o  ? Hypertension   ? Stroke [facility]) 2006  ? possible light stroke-no deficits  ? ? ?Past Surgical History:  ?Procedure Laterality Date  ? CYSTOSCOPY W/ RETROGRADES Bilateral 06/07/2019  ? Procedure: CYSTOSCOPY WITH RETROGRADE PYELOGRAM;  Surgeon: Hollice Espy, MD;  Location: [facility] ORS;  Service: Urology;  Laterality: Bilateral;  ? CYSTOSCOPY WITH BIOPSY N/A 06/07/2019  ? Procedure: CYSTOSCOPY WITH Bladder BIOPSY;  Surgeon: Hollice Espy, MD;  Location: [facility] ORS;  Service: Urology;  Laterality: N/A;  ? ESOPHAGOGASTRODUODENOSCOPY N/A 04/21/2015  ? Procedure: ESOPHAGOGASTRODUODENOSCOPY (EGD);  Surgeon: Hulen Luster, MD;  Location: [facility] ENDOSCOPY;  Service: Gastroenterology;  Laterality: N/A;  ? LITHOTRIPSY    ? SINUS SURGERY WITH INSTATRAK  2008  ? STAPEDES SURGERY Left age 70  ? 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 TONSILLECTOMY    ? ? ?There were no vitals filed for this visit. ? ? Subjective Assessment - 02/25/22 1225   ? ? Subjective "I don't talk much at home."   ?  Patient is accompained by: Family member   Spouse present for session  ? Currently in Pain? Other (Comment)   Denies pain but reports dizziness  ? Pain Score 0-No pain   ? Multiple Pain Sites No   ? ?  ?  ? ?  ? ? ? ? ? ? ? ? ADULT SLP TREATMENT - 02/25/22 1226   ? ?  ? General Information  ? Behavior/Cognition Alert;Cooperative;Pleasant mood   ? HPI Casey Adkins is an 88 y.o. male referred for MBS by his PCP Dr. Emily Filbert. Pt admitted 10/10/21- 10/11/21 for CVA. 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 Unable to obtain MRI due to ear implant. Hx of esophageal dysphagia requiring dilation in the past; reports 1 year hx of worsening dysphagia prior to CVA. Neurology noted right upper motor neuron pattern weakness, suspected small vessel lacunar infarction. 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 Wife assists with history given pt's dysarthria, aphasia.   ?  ? Treatment Provided  ? Treatment provided Cognitive-Linquistic   ?  ? Cognitive-Linquistic Treatment  ? Treatment focused on Dysarthria;Aphasia;Patient/family/caregiver education   ? Skilled Treatment Used written visual cue to review SLOP strategies. Targeted dysarthria at phrase level with pt's  interests (names of football/baseball teams), ~90% intelligibility with min cues. Increased to sentence level (common questions), with ~85% intelligibility, improved to 95% intelligibility with cues to use dysarthria compensations. Pt required cues to repeat unintelligible speech on 3 occasions. Targeted final consonant deletion with minimal pairs. Pt produced minimal pairs with word final consonant voicing differences with 80% accuracy with min-mod cues. Pt educated on word endings changing the meaning of words (ie clog vs clod.) During task, pt generated sentences with 90% intelligibility with min cues. Towards end of task pt independently  using strategies to communicate with spouse (loud, overpronounce). Pt expressed not having many opportunities to communicate at home. Facilitated generating list of functional phrases to practice trained strategies. Provided patient with handout of phrases and instructions to use SLOP strategies.   ?  ? Assessment / Recommendations / Plan  ? Plan Continue with current plan of care   ?  ? Progression Toward Goals  ? Progression toward goals Progressing toward goals   ? ?  ?  ? ?  ? ? ? SLP Education - 02/25/22 1306   ? ? Education Details SLOP strategies to improve intelligity (slow, loud, overpronounce, pause)   ? Person(s) Educated Patient   ? Methods Explanation   ? Comprehension Verbalized understanding   ? ?  ?  ? ?  ? ? ? SLP Short Term Goals - 02/20/22 1726   ? ?  ? SLP SHORT TERM GOAL #1  ? Title Pt will use aphasia compensations during 5-8 minutes conversation with occasional min cues.   ? Time 10   ? Period --   sessions  ? Status Achieved   ?  ? SLP SHORT TERM GOAL #2  ? Title Pt will demonstrate dysarthria HEP with rare min cues.   ? Time 10   ? Period --   sessions  ? Status Achieved   ?  ? SLP SHORT TERM GOAL #3  ? Title Patient will use compensations for dysarthria in sentence responses for >95% intelligibility.   ? Time 10   ? Period --   sessions  ? Status Achieved   ?  ? SLP SHORT TERM GOAL #4  ? Title Pt will complete further assessment of cognitive-communication PRN.   ? Time 10   ? Period --   sessions  ? Status Achieved   ?  ? SLP SHORT TERM GOAL #5  ? Title Patient will use compensations for dysarthria in sentence-level reading for >95% intelligibility.   ? Time 10   ? Period --   sessions  ? Status Partially Met   revised after worsening symptoms, possible new CVA on 01/21/22  ?  ? SLP SHORT TERM GOAL #6  ? Title Patient will demonstrate alternating attention over 5 minutes between 2 simple cognitive-linguistic task with 90% accuracy.   ? Time 10   ? Period --   sessions  ? Status Deferred    Deferred due to patient's preference for focus on intelligibility  ? ?  ?  ? ?  ? ? ? SLP Long Term Goals - 02/20/22 1730   ? ?  ? SLP LONG TERM GOAL #1  ? Title Pt will use aphasia compensations effectively during 15 minutes mod complex conversation with modified independence.   ? Time 12   ? Period Weeks   ? Status Revised   revised after worsening symptoms, possible new CVA on 01/21/22  ?  ? SLP LONG TERM GOAL #2  ? Title Pt  will use aphasia compensations with modified independence to resolve communication breakdown at least 3 occasions outside of ST, per pt/spouse report.   ? Time 12   ? Period Weeks   ? Status On-going   ?  ? SLP LONG TERM GOAL #3  ? Title Patient will use compensations for dysarthria in 10 minute conversation for >80% intelligibility with no more than 2 repetitions necessary.   ? Time 12   ? Period Weeks   ? Status Revised   Revised due to progress/pt's demands  ?  ? SLP LONG TERM GOAL #4  ? Title Patient will demonstrate swallowing precautions independently x3 sessions with no overt s/sx aspiration.   ? Time 12   ? Period Weeks   ? Status On-going   ? ?  ?  ? ?  ? ? ? Plan - 02/25/22 1308   ? ? Clinical Impression Statement Patient continues with moderate dysarthria, mild aphasia, and cognitive communication deficits, slightly worsened since hospitalization 01/21/22 for new stroke-like symptoms. Pt/spouse report increased difficulty with speech and word-finding this past weekend. Question if secondary to increased interactions this past weekend. Pt gradually increasing independent usage of dysarthria strategies during structured tasks, requires cues for consistent usage. Pt was noted to carry over at end of session when addressing his wife. Pt intelligibility is continuing to improve at sentence level with cues to repeat unintelligible speech. I recommend continued skilled ST to improve intelligibility, verbal expression and swallowing safety necessary for increased communicatory success and  independence in the home and community environments.   ? Speech Therapy Frequency 2x / week   ? Duration 12 weeks   ? Treatment/Interventions Aspiration precaution training;Environmental controls;Language fa

## 2022-02-27 ENCOUNTER — Ambulatory Visit: Payer: Medicare Other | Admitting: Speech Pathology

## 2022-02-27 ENCOUNTER — Ambulatory Visit: Payer: Medicare Other

## 2022-02-27 DIAGNOSIS — M6281 Muscle weakness (generalized): Secondary | ICD-10-CM

## 2022-02-27 DIAGNOSIS — R278 Other lack of coordination: Secondary | ICD-10-CM

## 2022-02-27 DIAGNOSIS — R471 Dysarthria and anarthria: Secondary | ICD-10-CM

## 2022-02-27 DIAGNOSIS — R269 Unspecified abnormalities of gait and mobility: Secondary | ICD-10-CM

## 2022-02-27 DIAGNOSIS — R4701 Aphasia: Secondary | ICD-10-CM

## 2022-02-27 DIAGNOSIS — R2681 Unsteadiness on feet: Secondary | ICD-10-CM

## 2022-02-27 DIAGNOSIS — R262 Difficulty in walking, not elsewhere classified: Secondary | ICD-10-CM

## 2022-02-27 NOTE — Therapy (Signed)
[place] [facility] ?[place], [place], [address] ?Phone: [phone]   Fax:  [phone] ? ?Occupational Therapy Treatment ? ?Patient Details  ?Name: Casey Adkins ?MRN: [id number] ?Date of Birth: 09-Nov-1934 ?Referring Provider (OT): Dr. Emily Filbert, PCP ? ? ?Encounter Date: 02/27/2022 ? ? OT End of Session - 02/27/22 1721   ? ? Visit Number 7   ? Number of Visits 12   ? Date for OT Re-Evaluation 03/13/22   ? Authorization Type Progress reporting period starting 01/31/2022   ? OT Start Time 1015   ? OT Stop Time 1100   ? OT Time Calculation (min) 45 min   ? Activity Tolerance Patient tolerated treatment well   ? Behavior During Therapy [facility] for tasks assessed/performed   ? ?  ?  ? ?  ? ? ?Past Medical History:  ?Diagnosis Date  ? Allergic rhinitis   ? Arthritis   ? Cancer [facility])   ? Bladder Cancer  ? Cancer [facility])   ? Prostate Cancer  ? Diabetes mellitus without complication ([place])   ? Diverticulosis   ? Dysphagia   ? GERD (gastroesophageal reflux disease)   ? History of kidney stones   ? h/o  ? Hypertension   ? Stroke [facility]) 2006  ? possible light stroke-no deficits  ? ? ?Past Surgical History:  ?Procedure Laterality Date  ? CYSTOSCOPY W/ RETROGRADES Bilateral 06/07/2019  ? Procedure: CYSTOSCOPY WITH RETROGRADE PYELOGRAM;  Surgeon: Hollice Espy, MD;  Location: [facility] ORS;  Service: Urology;  Laterality: Bilateral;  ? CYSTOSCOPY WITH BIOPSY N/A 06/07/2019  ? Procedure: CYSTOSCOPY WITH Bladder BIOPSY;  Surgeon: Hollice Espy, MD;  Location: [facility] ORS;  Service: Urology;  Laterality: N/A;  ? ESOPHAGOGASTRODUODENOSCOPY N/A 04/21/2015  ? Procedure: ESOPHAGOGASTRODUODENOSCOPY (EGD);  Surgeon: Hulen Luster, MD;  Location: [facility] ENDOSCOPY;  Service: Gastroenterology;  Laterality: N/A;  ? LITHOTRIPSY    ? SINUS SURGERY WITH INSTATRAK  2008  ? STAPEDES SURGERY Left age 40  ? 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 TONSILLECTOMY    ? ? ?There were no  vitals filed for this visit. ? ? Subjective Assessment - 02/27/22 1720   ? ? Subjective  Pt reports he's slightly dizzy today with the change in weather.   ? 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  Unable to confirm another stroke via MRI d/t ear implant.   ? Limitations R sided weakness, lack of coordination, impaired balance, slurred speech   ? Patient Stated Goals To get back to bowling.   ? Currently in Pain? No/denies   ? Pain Score 0-No pain   ? ?  ?  ? ?  ?Occupational Therapy Treatment: ?Neuro re-ed: ?Facilitated [place] and dexterity skills working with Customer service manager.  Pt practiced picking up pieces from dish, storing multiple in hand, constructing pieces, and discarding stored pieces in palm 1 at time.  Practiced with grooved pegs, cueing pt to turn pegs within fingertips to line up grooves in board without using table top surface to reposition peg in fingertips.  ? ?Self Care: ?Practiced scooping beads into teaspoon and bringing from plate near mouth, and transporting back to a cup  to simulate steadying spoon with small pieces of food.  Transitioned to use of weighted spoon d/t mild tremor.  Pt felt weighted spoon to be helpful.  OT advised on options to obtain.   ? ?Response to Treatment: ?Pt making steady gains toward OT goals and the decrease in OT and PT to 1x per week seems to be more manageable for pt.  OT avoided dynamic standing tasks this day as pt felt "swimmy headed" from the change in weather.  Pt continues to have occasional dropped items from ulnar side of hand when working to store and discard items from palm of hand.  Pt practiced scooping with spoon today with regular and weighted  utensils.  Pt felt weighted spoon was helpful today to minimize tremor and plans to order.  Pt will continue to benefit from skilled OT to address dynamic standing balance and coordination deficits which have contributed to functional decline and decreased engagement in leisure activities.  ?  ? ? ? ? OT Education - 02/27/22 1720   ? ? Education Details possible benefit of weighted utensils to improve self feeding with hand tremors   ? Person(s) Educated Patient;Spouse   ? Methods Explanation;Verbal cues;Demonstration;Tactile cues   ? Comprehension Verbalized understanding;Need further instruction;Returned demonstration;Verbal cues required;Tactile cues required   ? ?  ?  ? ?  ? ? ? OT Short Term Goals - 02/01/22 1514   ? ?  ? OT SHORT TERM GOAL #1  ? Title Pt will perform HEP for increasing hand coordination with min vc.   ? Baseline Eval: initiated HEP at eval, further training needed   ? Time 3   ? Period Weeks   ? Status New   ? Target Date 02/20/22   ? ?  ?  ? ?  ? ? ? ? OT Long Term Goals - 02/01/22 1514   ? ?  ? OT LONG TERM GOAL #1  ? Title Pt will increase FOTO score by 2 or more points to indicate increased functional performance.   ? Baseline Eval: FOTO 60   ? Time 6   ? Period Weeks   ? Status New   ? Target Date 03/13/22   ?  ? OT LONG TERM GOAL #2  ? Title Pt will increase R hand coordination to manage eating utensils with good control.   ? Baseline Eval: Pt reports being clumsy with utensils   ? Time 6   ? Period Weeks   ? Status New   ? Target Date 03/13/22   ?  ? OT LONG TERM GOAL #3  ? Title Pt will increase R hand dexterity for better manipulation of ADL supplies as noted by <60 sec to complete 9 hole peg test.   ? Baseline Eval: R 1 min 7 sec, L 39 sec   ? Time 6   ? Period Weeks   ? Status New   ? Target Date 03/13/22   ?  ? OT LONG TERM GOAL #4  ? Title Pt will perform bathroom transfers with modified indep-indep.   ? Baseline Eval: spouse supervises all functional transfers; pt using RW for  support.   ? Time 6   ? Period Weeks   ? Status New   ? Target Date 03/13/22   ?  ? OT LONG TERM GOAL #5  ? Title Pt will increase dynamic standing balance to enable pt to roll a light weight (5lbs or less) ball independently to work towards return to bowling.   ?  Baseline Eval: unable to participate in bowling (pt's primary leisure activity)   ? Time 6   ? Period Weeks   ? Status New   ? Target Date 03/13/22   ? ?  ?  ? ?  ? ? ? ? Plan - 02/27/22 1731   ? ? Clinical Impression Statement Pt making steady gains toward OT goals and the decrease in OT and PT to 1x per week seems to be more manageable for pt.  OT avoided dynamic standing tasks this day as pt felt "swimmy headed" from the change in weather.  Pt continues to have occasional dropped items from ulnar side of hand when working to store and discard items from palm of hand.  Pt practiced scooping with spoon today with regular and weighted utensils.  Pt felt weighted spoon was helpful today to minimize tremor and plans to order.  Pt will continue to benefit from skilled OT to address dynamic standing balance and coordination deficits which have contributed to functional decline and decreased engagement in leisure activities.   ? OT Occupational Profile and History Problem Focused Assessment - Including review of records relating to presenting problem   ? Occupational performance deficits (Please refer to evaluation for details): ADL's;IADL's;Leisure   ? Body Structure / Function / Physical Skills ADL;Coordination;GMC;UE functional use;Balance;IADL;Dexterity;FMC;Mobility;Gait   ? Rehab Potential Good   ? Clinical Decision Making Limited treatment options, no task modification necessary   ? Comorbidities Affecting Occupational Performance: May have comorbidities impacting occupational performance   ? Modification or Assistance to Complete Evaluation  No modification of tasks or assist necessary to complete eval   ? OT Frequency 2x / week   ? OT Duration 6 weeks    ? OT Treatment/Interventions Self-care/ADL training;Therapeutic exercise;DME and/or AE instruction;Functional Mobility Training;Balance training;Neuromuscular education;Therapeutic activities;Patient/fam

## 2022-02-27 NOTE — Therapy (Signed)
[facility] ?[facility] ?[place], [place], [address] ?Phone: [phone]   Fax:  [phone] ? ?Physical Therapy Treatment ? ?Patient Details  ?Name: Casey Adkins ?MRN: [id number] ?Date of Birth: 1934-09-02 ?Referring Provider (PT): Dr. Emily Filbert ? ? ?Encounter Date: 02/27/2022 ? ? PT End of Session - 02/27/22 1145   ? ? Visit Number 6   ? Number of Visits 25   ? Date for PT Re-Evaluation 04/25/22   ? Authorization Time Period initial cert 4/0/1027- 12/30/3662   ? Progress Note Due on Visit 10   ? PT Start Time 1145   ? PT Stop Time 1227   ? PT Time Calculation (min) 42 min   ? Equipment Utilized During Treatment Gait belt   ? Activity Tolerance Patient tolerated treatment well   ? Behavior During Therapy [facility] for tasks assessed/performed   ? ?  ?  ? ?  ? ? ?Past Medical History:  ?Diagnosis Date  ? Allergic rhinitis   ? Arthritis   ? Cancer [facility])   ? Bladder Cancer  ? Cancer [facility])   ? Prostate Cancer  ? Diabetes mellitus without complication ([place])   ? Diverticulosis   ? Dysphagia   ? GERD (gastroesophageal reflux disease)   ? History of kidney stones   ? h/o  ? Hypertension   ? Stroke [facility]) 2006  ? possible light stroke-no deficits  ? ? ?Past Surgical History:  ?Procedure Laterality Date  ? CYSTOSCOPY W/ RETROGRADES Bilateral 06/07/2019  ? Procedure: CYSTOSCOPY WITH RETROGRADE PYELOGRAM;  Surgeon: Hollice Espy, MD;  Location: [facility] ORS;  Service: Urology;  Laterality: Bilateral;  ? CYSTOSCOPY WITH BIOPSY N/A 06/07/2019  ? Procedure: CYSTOSCOPY WITH Bladder BIOPSY;  Surgeon: Hollice Espy, MD;  Location: [facility] ORS;  Service: Urology;  Laterality: N/A;  ? ESOPHAGOGASTRODUODENOSCOPY N/A 04/21/2015  ? Procedure: ESOPHAGOGASTRODUODENOSCOPY (EGD);  Surgeon: Hulen Luster, MD;  Location: [facility] ENDOSCOPY;  Service: Gastroenterology;  Laterality: N/A;  ? LITHOTRIPSY    ? SINUS SURGERY WITH INSTATRAK  2008  ? STAPEDES SURGERY Left age 85  ? 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 TONSILLECTOMY    ? ? ?There were no vitals filed for this visit. ? ? Subjective Assessment - 02/28/22 1140   ? ? Subjective Patient reports no new complaint except some "swimmy head." He states still getting used to his new glasses.   ? Patient is accompained by: Family member   ? Pertinent History Casey Adkins is an 87 y.o. male referred by his PCP Dr. Emily Filbert for weakness. Pt was admitted on 10/10/21- 10/11/21 for CVA. 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 Unable to obtain MRI due to ear implant. Hx of esophageal dysphagia requiring dilation in the past; reports 1 year hx of worsening dysphagia prior to CVA.   ? How long can you sit comfortably? No issues   ? How long can you stand comfortably? Not limited as long as holding onto something   ? How long can you walk comfortably? < 15 min   ? Patient Stated Goals Walk without losing my balance and no falls   ? Currently in Pain? No/denies   ? ?  ?  ? ?  ?Patient reports feeling a little swimmy headed ? ?BP= 149/74 mmHg Right arm in sitting ?BP= 142/76 mmHg Right arm in standing (asymptomatic)  ? ? ? ?  Ladder drills- Forward- step 1 foot per square - down and back x 10 ?Ladder drills - side steps- step both feet into each square - down and back x 10 reps ?Ladder drills- 90 turn and step  forward then turn another 90 deg repeat to focus on turning - making way down the floor ladder in // bars ? ?In // bars- 180 deg turn and walk x 3 in bars x 4 trials ? ?Resistive gait- Matrix cable system - Forward x 5 trials at 12.5 lb - Mild difficulty initiating movement but quickly improved  ? ? ?Practice sequence of bowling in // bars- patient would go through his bowling motion without ball- initially mild unsteadiness ? ?Education provided throughout session via VC/TC and demonstration to facilitate movement at target joints and correct muscle activation for  all testing and exercises performed.  ? ? ? ? ? ? ? ? ? ? ? ? ? ? ? ? ? PT Education - 02/28/22 1141   ? ? Education Details Exercise and bowling technique   ? Person(s) Educated Patient   ? Methods Explanation;Demonstration;Tactile cues;Verbal cues   ? Comprehension Verbalized understanding;Returned demonstration;Verbal cues required;Tactile cues required;Need further instruction   ? ?  ?  ? ?  ? ? ? PT Short Term Goals - 02/01/22 1215   ? ?  ? PT SHORT TERM GOAL #1  ? Title Pt will be independent with initial HEP in order to improve strength and balance in order to decrease fall risk and improve function at home and work.   ? Baseline Eval= No formal HEP in place   ? Time 6   ? Period Weeks   ? Status New   ? Target Date 03/14/22   ? ?  ?  ? ?  ? ? ? ? PT Long Term Goals - 02/01/22 1215   ? ?  ? PT LONG TERM GOAL #1  ? Title Pt will be independent with final HEP in order to improve strength and balance in order to decrease fall risk and improve function at home and work.   ? Baseline Eval= No formal HEP in place   ? Time 12   ? Period Weeks   ? Status New   ? Target Date 04/25/22   ?  ? PT LONG TERM GOAL #2  ? Title Pt will improve FOTO to target score of to display perceived improvements in ability to complete ADL's.   ? Baseline Eval= 60   ? Time 12   ? Period Weeks   ? Status New   ? Target Date 04/25/22   ?  ? PT LONG TERM GOAL #3  ? Title Pt will improve BERG by at least 3 points in order to demonstrate clinically significant improvement in balance.   ? Baseline Eval= 36/56   ? Time 12   ? Period Weeks   ? Status New   ? Target Date 04/25/22   ?  ? PT LONG TERM GOAL #4  ? Title Pt will decrease 5TSTS by at least 3 seconds in order to demonstrate clinically significant improvement in LE strength.   ? Baseline Eval= 23.3 sec with BUE Support   ? Time 12   ? Period Weeks   ? Status New   ? Target Date 04/25/22   ?  ? PT LONG TERM GOAL #5  ? Title Pt will decrease TUG to below 20 seconds/decrease in order to  demonstrate decreased fall risk.   ?  Baseline Eval= 31.44 with use of RW   ? Time 12   ? Period Weeks   ? Status New   ? Target Date 04/25/22   ?  ? Additional Long Term Goals  ? Additional Long Term Goals Yes   ?  ? PT LONG TERM GOAL #6  ? Title Pt will increase 10MWT by at least 0.13 m/s in order to demonstrate clinically significant improvement in community ambulation.   ? Baseline Eval= 0.62 m/s using RW   ? Time 12   ? Period Weeks   ? Status New   ? Target Date 04/25/22   ? ?  ?  ? ?  ? ? ? ? ? ? ? ? Plan - 02/27/22 1145   ? ? Clinical Impression Statement Patient arrived with good motivation for today's session and participated well with all balance activities without any further report of swimmy head feeling. He was able to simulate his bowling stance/form technique and was initially unsteady but quickly improved with practice. Patient will benefit from continued PT services to improve overall LE strength, balance, and safe mobility for decreased risk of falling and improved functional mobility and quality of life.   ? Personal Factors and Comorbidities Age;Comorbidity 3+   ? Comorbidities Diabetes, CVA, HTN   ? Examination-Activity Limitations Caring for Others;Lift;Squat;Stairs;Transfers   ? Examination-Participation Restrictions Cleaning;Community Activity;[place] [employer] Work   ? Stability/Clinical Decision Making Evolving/Moderate complexity   ? Rehab Potential Good   ? PT Frequency 2x / week   ? PT Duration 12 weeks   ? PT Treatment/Interventions ADLs/Self Care Home Management;Canalith Repostioning;Cryotherapy;Electrical Stimulation;Moist Heat;Traction;Ultrasound;DME Instruction;Gait training;Stair training;Functional mobility training;Therapeutic activities;Therapeutic exercise;Balance training;Neuromuscular re-education;Patient/family education;Manual techniques;Passive range of motion;Dry needling;Vestibular   ? PT Next Visit Plan Instruction in sit to stand, LE Strengthening, Balance interventions.   ? PT  Home Exercise Plan Access Code: [email or web address]  URL: [email or web address]   ? Consulted and Agree with Plan of Care Patient   ? ?  ?  ? ?  ? ? ?Patient will benefit from skilled therapeutic intervent

## 2022-02-27 NOTE — Therapy (Signed)
[facility] ?[facility] ?[place], [place], [address] ?Phone: [phone]   Fax:  [phone] ? ?Speech Language Pathology Treatment ? ?Patient Details  ?Name: Casey Adkins ?MRN: [id number] ?Date of Birth: 1934/02/04 ?Referring Provider (SLP): Rusty Aus, MD ? ? ?Encounter Date: 02/27/2022 ? ? End of Session - 02/27/22 1343   ? ? Visit Number 22   ? Number of Visits 37   ? Date for SLP Re-Evaluation 04/28/22   ? SLP Start Time 1106   ? SLP Stop Time  1145   ? SLP Time Calculation (min) 39 min   ? Activity Tolerance Patient tolerated treatment well   ? ?  ?  ? ?  ? ? ?Past Medical History:  ?Diagnosis Date  ? Allergic rhinitis   ? Arthritis   ? Cancer [facility])   ? Bladder Cancer  ? Cancer [facility])   ? Prostate Cancer  ? Diabetes mellitus without complication ([place])   ? Diverticulosis   ? Dysphagia   ? GERD (gastroesophageal reflux disease)   ? History of kidney stones   ? h/o  ? Hypertension   ? Stroke [facility]) 2006  ? possible light stroke-no deficits  ? ? ?Past Surgical History:  ?Procedure Laterality Date  ? CYSTOSCOPY W/ RETROGRADES Bilateral 06/07/2019  ? Procedure: CYSTOSCOPY WITH RETROGRADE PYELOGRAM;  Surgeon: Hollice Espy, MD;  Location: [facility] ORS;  Service: Urology;  Laterality: Bilateral;  ? CYSTOSCOPY WITH BIOPSY N/A 06/07/2019  ? Procedure: CYSTOSCOPY WITH Bladder BIOPSY;  Surgeon: Hollice Espy, MD;  Location: [facility] ORS;  Service: Urology;  Laterality: N/A;  ? ESOPHAGOGASTRODUODENOSCOPY N/A 04/21/2015  ? Procedure: ESOPHAGOGASTRODUODENOSCOPY (EGD);  Surgeon: Hulen Luster, MD;  Location: [facility] ENDOSCOPY;  Service: Gastroenterology;  Laterality: N/A;  ? LITHOTRIPSY    ? SINUS SURGERY WITH INSTATRAK  2008  ? STAPEDES SURGERY Left age 42  ? 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 TONSILLECTOMY    ? ? ?There were no vitals filed for this visit. ? ? Subjective Assessment - 02/27/22 1252   ? ? Subjective "I got good news"   ? Patient is  accompained by: Family member   Spouse present for session  ? Currently in Pain? No/denies   Denies pain, reports feeling dizzy  ? Pain Score 0-No pain   ? ?  ?  ? ?  ? ? ? ? ? ? ? ? ADULT SLP TREATMENT - 02/27/22 1254   ? ?  ? General Information  ? Behavior/Cognition Alert;Cooperative;Pleasant mood   ? HPI Casey Adkins is an 88 y.o. male referred for MBS by his PCP Dr. Emily Filbert. Pt admitted 10/10/21- 10/11/21 for CVA. 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 Unable to obtain MRI due to ear implant. Hx of esophageal dysphagia requiring dilation in the past; reports 1 year hx of worsening dysphagia prior to CVA. Neurology noted right upper motor neuron pattern weakness, suspected small vessel lacunar infarction. 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 Wife assists with history given pt's dysarthria, aphasia.   ?  ? Treatment Provided  ? Treatment provided Cognitive-Linquistic   ?  ? Cognitive-Linquistic Treatment  ? Treatment focused on Dysarthria;Aphasia;Patient/family/caregiver education   ? Skilled Treatment Spouse reports increased difficulty with pt's speech this past week. Education provided to pt/spouse on increased interactions and communication with familiar vs unfamiliar listeners (family  members they don't see often). Discussed times where slurred speech may be more noticeable, along with decreased intelligibility (later in the day, being tired, etc). Facilitated awareness of stroke symptoms and when to be concerned. Targeted naming/word finding with word identification from multiple definitions task. After demonstration x2, Pt identified words with 86% accuracy (18/21), improved to 100% accuracy with min cues. Targeted dysarthria with sentence level reading tasks. Pt read 5-7 word sentences with ~95% accuracy with ocassional min cues. Increased to 8-9 word sentences,~90%  accuracy with min-mod cues to use dysarthria compensations. During sentence generation task (modified taboo), pt produced sentences with ~90% accuracy with min cues to repeat unintelligible speech.   ?  ? Assessment / Recommendations / Plan  ? Plan Continue with current plan of care   ?  ? Progression Toward Goals  ? Progression toward goals Progressing toward goals   ? ?  ?  ? ?  ? ? ? SLP Education - 02/27/22 1342   ? ? Education Details Rationale for decreased intelligibility/ difficulty with unfamiliar communication partners   ? Person(s) Educated Patient   ? Methods Explanation   ? Comprehension Verbalized understanding   ? ?  ?  ? ?  ? ? ? SLP Short Term Goals - 02/20/22 1726   ? ?  ? SLP SHORT TERM GOAL #1  ? Title Pt will use aphasia compensations during 5-8 minutes conversation with occasional min cues.   ? Time 10   ? Period --   sessions  ? Status Achieved   ?  ? SLP SHORT TERM GOAL #2  ? Title Pt will demonstrate dysarthria HEP with rare min cues.   ? Time 10   ? Period --   sessions  ? Status Achieved   ?  ? SLP SHORT TERM GOAL #3  ? Title Patient will use compensations for dysarthria in sentence responses for >95% intelligibility.   ? Time 10   ? Period --   sessions  ? Status Achieved   ?  ? SLP SHORT TERM GOAL #4  ? Title Pt will complete further assessment of cognitive-communication PRN.   ? Time 10   ? Period --   sessions  ? Status Achieved   ?  ? SLP SHORT TERM GOAL #5  ? Title Patient will use compensations for dysarthria in sentence-level reading for >95% intelligibility.   ? Time 10   ? Period --   sessions  ? Status Partially Met   revised after worsening symptoms, possible new CVA on 01/21/22  ?  ? SLP SHORT TERM GOAL #6  ? Title Patient will demonstrate alternating attention over 5 minutes between 2 simple cognitive-linguistic task with 90% accuracy.   ? Time 10   ? Period --   sessions  ? Status Deferred   Deferred due to patient's preference for focus on intelligibility  ? ?  ?  ? ?  ? ? ?  SLP Long Term Goals - 02/20/22 1730   ? ?  ? SLP LONG TERM GOAL #1  ? Title Pt will use aphasia compensations effectively during 15 minutes mod complex conversation with modified independence.   ? Time 12   ? Period Weeks   ? Status Revised   revised after worsening symptoms, possible new CVA on 01/21/22  ?  ? SLP LONG TERM GOAL #2  ? Title Pt will use aphasia compensations with modified independence to resolve communication breakdown at least 3 occasions outside of ST, per pt/spouse  report.   ? Time 12   ? Period Weeks   ? Status On-going   ?  ? SLP LONG TERM GOAL #3  ? Title Patient will use compensations for dysarthria in 10 minute conversation for >80% intelligibility with no more than 2 repetitions necessary.   ? Time 12   ? Period Weeks   ? Status Revised   Revised due to progress/pt's demands  ?  ? SLP LONG TERM GOAL #4  ? Title Patient will demonstrate swallowing precautions independently x3 sessions with no overt s/sx aspiration.   ? Time 12   ? Period Weeks   ? Status On-going   ? ?  ?  ? ?  ? ? ? Plan - 02/27/22 1344   ? ? Clinical Impression Statement Patient continues with moderate dysarthria, mild aphasia, and cognitive communication deficits, slightly worsened since hospitalization 01/21/22 for new stroke-like symptoms. Noted pt using dysarthria strategies independently during spontaneous speech at beginning of session. During tasks, pt demonstrating increased self-monitoring and awareness. Pt gradually repeating unintelligible speech independently. Pt continues to improve intelligibility at sentence level. Pt/spouse educated on factors that contribute to communication breakdowns. I recommend continued skilled ST to improve intelligibility, verbal expression and swallowing safety necessary for increased communicatory success and independence in the home and community environments.   ? Speech Therapy Frequency 2x / week   ? Duration 12 weeks   ? Treatment/Interventions Aspiration precaution  training;Environmental controls;Language facilitation;Cueing hierarchy;Oral motor exercises;SLP instruction and feedback;Pharyngeal strengthening exercises;Compensatory techniques;Cognitive reorganization;Functional ta

## 2022-03-01 ENCOUNTER — Emergency Department: Payer: Medicare Other

## 2022-03-01 ENCOUNTER — Other Ambulatory Visit: Payer: Self-pay

## 2022-03-01 ENCOUNTER — Emergency Department
Admission: EM | Admit: 2022-03-01 | Discharge: 2022-03-01 | Disposition: A | Discharge status: Home / Self Care | Payer: Medicare Other | Attending: Emergency Medicine | Admitting: Emergency Medicine

## 2022-03-01 ENCOUNTER — Ambulatory Visit: Payer: Medicare Other | Admitting: Physical Therapy

## 2022-03-01 DIAGNOSIS — Z8551 Personal history of malignant neoplasm of bladder: Secondary | ICD-10-CM | POA: Diagnosis not present

## 2022-03-01 DIAGNOSIS — I10 Essential (primary) hypertension: Secondary | ICD-10-CM | POA: Insufficient documentation

## 2022-03-01 DIAGNOSIS — Z8546 Personal history of malignant neoplasm of prostate: Secondary | ICD-10-CM | POA: Diagnosis not present

## 2022-03-01 DIAGNOSIS — S0101XA Laceration without foreign body of scalp, initial encounter: Secondary | ICD-10-CM | POA: Diagnosis not present

## 2022-03-01 DIAGNOSIS — E119 Type 2 diabetes mellitus without complications: Secondary | ICD-10-CM | POA: Insufficient documentation

## 2022-03-01 DIAGNOSIS — W19XXXA Unspecified fall, initial encounter: Secondary | ICD-10-CM

## 2022-03-01 DIAGNOSIS — W228XXA Striking against or struck by other objects, initial encounter: Secondary | ICD-10-CM | POA: Insufficient documentation

## 2022-03-01 DIAGNOSIS — S0990XA Unspecified injury of head, initial encounter: Secondary | ICD-10-CM | POA: Diagnosis present

## 2022-03-01 LAB — BASIC METABOLIC PANEL
Anion gap: 11 (ref 5–15)
BUN: 17 mg/dL (ref 8–23)
CO2: 23 mmol/L (ref 22–32)
Calcium: 9.1 mg/dL (ref 8.9–10.3)
Chloride: 106 mmol/L (ref 98–111)
Creatinine, Ser: 0.82 mg/dL (ref 0.61–1.24)
GFR, Estimated: >60 mL/min (ref >60)
Glucose, Bld: 159 mg/dL — ABNORMAL HIGH (ref 70–99)
Potassium: 4.7 mmol/L (ref 3.5–5.1)
Sodium: 140 mmol/L (ref 135–145)

## 2022-03-01 LAB — URINALYSIS, COMPLETE (UACMP) WITH MICROSCOPIC
Bacteria, UA: NONE SEEN
Bilirubin Urine: NEGATIVE
Glucose, UA: NEGATIVE mg/dL
Hgb urine dipstick: NEGATIVE
Ketones, ur: 5 mg/dL — AB
Leukocytes,Ua: NEGATIVE
Nitrite: NEGATIVE
Protein, ur: NEGATIVE mg/dL
Specific Gravity, Urine: 1.015 (ref 1.005–1.030)
Squamous Epithelial / HPF: NONE SEEN (ref 0–5)
pH: 7 (ref 5.0–8.0)

## 2022-03-01 LAB — CBC
HCT: 39.4 % (ref 39.0–52.0)
Hemoglobin: 12.8 g/dL — ABNORMAL LOW (ref 13.0–17.0)
MCH: 30.6 pg (ref 26.0–34.0)
MCHC: 32.5 g/dL (ref 30.0–36.0)
MCV: 94.3 fL (ref 80.0–100.0)
Platelets: 147 10*3/uL — ABNORMAL LOW (ref 150–400)
RBC: 4.18 MIL/uL — ABNORMAL LOW (ref 4.22–5.81)
RDW: 12.9 % (ref 11.5–15.5)
WBC: 8.7 10*3/uL (ref 4.0–10.5)
nRBC: 0 % (ref 0.0–0.2)

## 2022-03-01 LAB — TROPONIN I (HIGH SENSITIVITY): Troponin I (High Sensitivity): 8 ng/L (ref <18)

## 2022-03-01 NOTE — ED Provider Notes (Signed)
? ?[facility] ?Provider Note ? ? ? Event Date/Time  ? First MD Initiated Contact with Patient 03/01/22 0234   ?  (approximate) ? ? ?History  ? ?Fall ? ? ?HPI ? ?Casey Adkins is a 88 y.o. male with a history of CVA on Plavix, bladder cancer, prostate cancer, diabetes, hypertension who presents from home for evaluation after mechanical fall.  Patient reports that he got up to go to the bathroom when he lost his balance and fell.  He hit the right side of his head onto a dresser and sustained a laceration.  Patient does report several episodes of dizziness since his last stroke in February.  He also has a ear implant and has surfer from dizziness.  He describes the dizziness as feeling off balance.  Denies lightheadedness or feeling like he is going to pass out.  He denies feeling dizzy this evening and reports that his fall was mechanical in nature.  He has been seen by his ENT and neurologist and they feel that his vertigo is because of his most recent stroke.  He denies neck pain, back pain, extremity pain, chest pain, shortness of breath.  He is complaining of a 2 out of 10 pain on the right side of his head where he sustained a laceration.  Last tetanus shot was 2020 ?  ? ? ?Past Medical History:  ?Diagnosis Date  ? Allergic rhinitis   ? Arthritis   ? Cancer [facility])   ? Bladder Cancer  ? Cancer [facility])   ? Prostate Cancer  ? Diabetes mellitus without complication ([place])   ? Diverticulosis   ? Dysphagia   ? GERD (gastroesophageal reflux disease)   ? History of kidney stones   ? h/o  ? Hypertension   ? Stroke [facility]) 2006  ? possible light stroke-no deficits  ? ? ?Past Surgical History:  ?Procedure Laterality Date  ? CYSTOSCOPY W/ RETROGRADES Bilateral 06/07/2019  ? Procedure: CYSTOSCOPY WITH RETROGRADE PYELOGRAM;  Surgeon: Hollice Espy, MD;  Location: [facility] ORS;  Service: Urology;  Laterality: Bilateral;  ? CYSTOSCOPY WITH BIOPSY N/A 06/07/2019  ? Procedure: CYSTOSCOPY WITH Bladder BIOPSY;   Surgeon: Hollice Espy, MD;  Location: [facility] ORS;  Service: Urology;  Laterality: N/A;  ? ESOPHAGOGASTRODUODENOSCOPY N/A 04/21/2015  ? Procedure: ESOPHAGOGASTRODUODENOSCOPY (EGD);  Surgeon: Hulen Luster, MD;  Location: [facility] ENDOSCOPY;  Service: Gastroenterology;  Laterality: N/A;  ? LITHOTRIPSY    ? SINUS SURGERY WITH INSTATRAK  2008  ? STAPEDES SURGERY Left age 52  ? 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 TONSILLECTOMY    ? ? ? ?Physical Exam  ? ?Triage Vital Signs: ?ED Triage Vitals  ?Enc Vitals Group  ?   BP 03/01/22 0240 (!) 162/76  ?   Pulse Rate 03/01/22 0240 67  ?   Resp 03/01/22 0240 13  ?   Temp 03/01/22 0240 98.1 ?F (36.7 ?C)  ?   Temp Source 03/01/22 0240 Oral  ?   SpO2 03/01/22 0240 97 %  ?   Weight 03/01/22 0248 185 lb (83.9 kg)  ?   Height 03/01/22 0248 '5\' 8"'$  (1.727 m)  ?   Head Circumference --   ?   Peak Flow --   ?   Pain Score 03/01/22 0244 2  ?   Pain Loc --   ?   Pain Edu? --   ?   Excl. in [place]? --   ? ? ?Most recent vital  signs: ?Vitals:  ? 03/01/22 0240 03/01/22 0400  ?BP: (!) 162/76 (!) 161/80  ?Pulse: 67 68  ?Resp: 13 15  ?Temp: 98.1 ?F (36.7 ?C)   ?SpO2: 97% 97%  ? ? ?Full spinal precautions maintained throughout the trauma exam. ?Constitutional: Alert and oriented. No acute distress. Does not appear intoxicated. ?HEENT ?Head: Normocephalic with a L shaped R parietal laceration ?Face: No facial bony tenderness. Stable midface ?Ears: No hemotympanum bilaterally. No Battle sign ?Eyes: No eye injury. PERRL. No raccoon eyes ?Nose: Nontender. No epistaxis. No rhinorrhea ?Mouth/Throat: Mucous membranes are moist. No oropharyngeal blood. No dental injury. Airway patent without stridor. Normal voice. ?Neck: no C-collar. No midline c-spine tenderness.  ?Cardiovascular: Normal rate, regular rhythm. Normal and symmetric distal pulses are present in all extremities. ?Pulmonary/Chest: Chest wall is stable and nontender to palpation/compression. Normal respiratory effort. Breath  sounds are normal. No crepitus.  ?Abdominal: Soft, nontender, non distended. ?Musculoskeletal: Nontender with normal full range of motion in all extremities. No deformities. No thoracic or lumbar midline spinal tenderness. Pelvis is stable. ?Skin: Skin is warm, dry and intact. No abrasions or contutions. ?Psychiatric: Speech and behavior are appropriate. ?Neurological: Normal speech and language. Moves all extremities to command. No gross focal neurologic deficits are appreciated. ? ?Glascow Coma Score: ?4 - Opens eyes on own ?6 - Follows simple motor commands ?5 - Alert and oriented ?GCS: 15 ? ? ?ED Results / Procedures / Treatments  ? ?Labs ?(all labs ordered are listed, but only abnormal results are displayed) ?Labs Reviewed  ?URINALYSIS, COMPLETE (UACMP) WITH MICROSCOPIC - Abnormal; Notable for the following components:  ?    Result Value  ? Color, Urine STRAW (*)   ? APPearance CLEAR (*)   ? Ketones, ur 5 (*)   ? All other components within normal limits  ?CBC - Abnormal; Notable for the following components:  ? RBC 4.18 (*)   ? Hemoglobin 12.8 (*)   ? Platelets 147 (*)   ? All other components within normal limits  ?BASIC METABOLIC PANEL - Abnormal; Notable for the following components:  ? Glucose, Bld 159 (*)   ? All other components within normal limits  ?TROPONIN I (HIGH SENSITIVITY)  ? ? ? ?EKG ? ?ED ECG REPORT ?I, Rudene Re, the attending physician, personally viewed and interpreted this ECG. ? ?Sinus rhythm with a rate of 65, frequent PVCs, no ST elevations or depressions.  Unchanged from prior ? ?RADIOLOGY ?I, Rudene Re, attending MD, have personally viewed and interpreted the images obtained during this visit as below: ? ?CT head and C-spine with no acute traumatic injury ? ? ?___________________________________________________ ?Interpretation by Radiologist:  ?CT Head Wo Contrast ? ?Result Date: 03/01/2022 ?CLINICAL DATA:  Fall EXAM: CT HEAD WITHOUT CONTRAST CT CERVICAL SPINE WITHOUT  CONTRAST TECHNIQUE: Multidetector CT imaging of the head and cervical spine was performed following the standard protocol without intravenous contrast. Multiplanar CT image reconstructions of the cervical spine were also generated. RADIATION DOSE REDUCTION: This exam was performed according to the departmental dose-optimization program which includes automated exposure control, adjustment of the mA and/or kV according to patient size and/or use of iterative reconstruction technique. COMPARISON:  None. FINDINGS: CT HEAD FINDINGS Brain: There is no mass, hemorrhage or extra-axial collection. There is generalized atrophy without lobar predilection. There is hypoattenuation of the periventricular white matter, most commonly indicating chronic ischemic microangiopathy. There is an old small vessel infarct of left basal ganglia. Vascular: Atherosclerotic calcification of the internal carotid arteries at the skull base.  No abnormal hyperdensity of the major intracranial arteries or dural venous sinuses. Skull: The visualized skull base, calvarium and extracranial soft tissues are normal. Sinuses/Orbits: No fluid levels or advanced mucosal thickening of the visualized paranasal sinuses. No mastoid or middle ear effusion. The orbits are normal. CT CERVICAL SPINE FINDINGS Alignment: No static subluxation. Facets are aligned. Occipital condyles are normally positioned. Skull base and vertebrae: No acute fracture. Soft tissues and spinal canal: No prevertebral fluid or swelling. No visible canal hematoma. Disc levels: No advanced spinal canal or neural foraminal stenosis. Upper chest: No pneumothorax, pulmonary nodule or pleural effusion. Other: Normal visualized paraspinal cervical soft tissues. IMPRESSION: 1. No acute intracranial abnormality. 2. No acute fracture or static subluxation of the cervical spine. Electronically Signed   By: Ulyses Jarred M.D.   On: 03/01/2022 03:16  ? ?CT Cervical Spine Wo Contrast ? ?Result Date:  03/01/2022 ?CLINICAL DATA:  Fall EXAM: CT HEAD WITHOUT CONTRAST CT CERVICAL SPINE WITHOUT CONTRAST TECHNIQUE: Multidetector CT imaging of the head and cervical spine was performed following the standard protocol wi

## 2022-03-01 NOTE — ED Notes (Signed)
Pt taken to CT.

## 2022-03-01 NOTE — Discharge Instructions (Addendum)
You received 3 staples on your scalp the need to be removed in 7 days.  It is okay to wash your head with shampoo and warm water like you usually do.  Watch for any signs of infection which include pus, fever, or redness/warmth of the skin around the cut.  If those develop any to see your doctor or come back to the ER.  Remaining of your labs look okay. ?

## 2022-03-01 NOTE — ED Triage Notes (Addendum)
Pt comes from home via [facility] with complaints of a fall. Pt hit his head on dresser when he got up to use the bathroom. Pt has laceration on top of head that is bandaged and bleeding is controlled at this time. Pt is on plavix. Pt states he has been dizzy for the past several days and his doctor told him this was a side effect of stroke he had in feb.  ?

## 2022-03-04 ENCOUNTER — Ambulatory Visit: Payer: Medicare Other | Admitting: Speech Pathology

## 2022-03-06 ENCOUNTER — Ambulatory Visit: Payer: Medicare Other | Admitting: Physical Therapy

## 2022-03-06 ENCOUNTER — Ambulatory Visit: Payer: Medicare Other

## 2022-03-06 ENCOUNTER — Ambulatory Visit: Payer: Medicare Other | Admitting: Speech Pathology

## 2022-03-06 DIAGNOSIS — R4701 Aphasia: Secondary | ICD-10-CM | POA: Diagnosis not present

## 2022-03-06 DIAGNOSIS — R262 Difficulty in walking, not elsewhere classified: Secondary | ICD-10-CM

## 2022-03-06 DIAGNOSIS — R2681 Unsteadiness on feet: Secondary | ICD-10-CM

## 2022-03-06 DIAGNOSIS — R278 Other lack of coordination: Secondary | ICD-10-CM

## 2022-03-06 DIAGNOSIS — R269 Unspecified abnormalities of gait and mobility: Secondary | ICD-10-CM

## 2022-03-06 DIAGNOSIS — R471 Dysarthria and anarthria: Secondary | ICD-10-CM

## 2022-03-06 DIAGNOSIS — M6281 Muscle weakness (generalized): Secondary | ICD-10-CM

## 2022-03-06 NOTE — Therapy (Signed)
[facility] [facility] ?[place], [place], [address] ?Phone: [phone]   Fax:  [phone] ? ?Speech Language Pathology Treatment ? ?Patient Details  ?Name: Casey Adkins ?MRN: [id number] ?Date of Birth: 05-22-1934 ?Referring Provider (SLP): Rusty Aus, MD ? ? ?Encounter Date: 03/06/2022 ? ? End of Session - 03/06/22 1214   ? ? Visit Number [id number]   ? Number of Visits 37   ? Date for SLP Re-Evaluation 04/28/22   ? SLP Start Time 1104   ? SLP Stop Time  1200   ? SLP Time Calculation (min) 56 min   ? Activity Tolerance Patient tolerated treatment well   ? ?  ?  ? ?  ? ? ?Past Medical History:  ?Diagnosis Date  ? Allergic rhinitis   ? Arthritis   ? Cancer [facility])   ? Bladder Cancer  ? Cancer [facility])   ? Prostate Cancer  ? Diabetes mellitus without complication ([place])   ? Diverticulosis   ? Dysphagia   ? GERD (gastroesophageal reflux disease)   ? History of kidney stones   ? h/o  ? Hypertension   ? Stroke [facility]) 2006  ? possible light stroke-no deficits  ? ? ?Past Surgical History:  ?Procedure Laterality Date  ? CYSTOSCOPY W/ RETROGRADES Bilateral 06/07/2019  ? Procedure: CYSTOSCOPY WITH RETROGRADE PYELOGRAM;  Surgeon: Hollice Espy, MD;  Location: [facility] ORS;  Service: Urology;  Laterality: Bilateral;  ? CYSTOSCOPY WITH BIOPSY N/A 06/07/2019  ? Procedure: CYSTOSCOPY WITH Bladder BIOPSY;  Surgeon: Hollice Espy, MD;  Location: [facility] ORS;  Service: Urology;  Laterality: N/A;  ? ESOPHAGOGASTRODUODENOSCOPY N/A 04/21/2015  ? Procedure: ESOPHAGOGASTRODUODENOSCOPY (EGD);  Surgeon: Hulen Luster, MD;  Location: [facility] ENDOSCOPY;  Service: Gastroenterology;  Laterality: N/A;  ? LITHOTRIPSY    ? SINUS SURGERY WITH INSTATRAK  2008  ? STAPEDES SURGERY Left age 62  ? 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 TONSILLECTOMY    ? ? ?There were no vitals filed for this visit. ? ? Subjective Assessment - 03/06/22 1211   ? ? Subjective "I think hitting my head set me  back some"   ? Patient is accompained by: Family member   Spouse present for session  ? Currently in Pain? No/denies   ? Pain Score 0-No pain   ? ?  ?  ? ?  ? ? ? ? ? ? ? ? ADULT SLP TREATMENT - 03/06/22 1216   ? ?  ? General Information  ? Behavior/Cognition Alert;Cooperative;Pleasant mood   ? HPI Romond Pipkins is an 88 y.o. male referred for MBS by his PCP Dr. Emily Filbert. Pt admitted 10/10/21- 10/11/21 for CVA. 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 Unable to obtain MRI due to ear implant. Hx of esophageal dysphagia requiring dilation in the past; reports 1 year hx of worsening dysphagia prior to CVA. Neurology noted right upper motor neuron pattern weakness, suspected small vessel lacunar infarction. 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 Wife assists with history given pt's dysarthria, aphasia.   ?  ? Treatment Provided  ? Treatment provided Cognitive-Linquistic   ?  ? Cognitive-Linquistic Treatment  ? Treatment focused on Dysarthria;Aphasia;Patient/family/caregiver education   ? Skilled Treatment Reviewed dysarthria strategies (SLOP) , pt was able to identify 3 out of 4 of the strategies (slow, loud, pause). Targeted dysarthria with sentence level reading  tasks. Pt read 8-9 word sentences with ~90% intelligibility with min cues. Increased to 10+ word sentences, ~70% intelligibility, improved to ~80% intelligibility with min-mod cues to swallow saliva and use dysarthria compensations (pause, slow down). During verbal response task (answering general information questions) pt provided sentence level responses with ~80% accuracy with min-mod cues. Pt benefited from cues to self-monitor and repeat unintelligible speech.   ?  ? Assessment / Recommendations / Plan  ? Plan Continue with current plan of care   ?  ? Progression Toward Goals  ? Progression toward goals Progressing toward  goals   ? ?  ?  ? ?  ? ? ? SLP Education - 03/06/22 1305   ? ? Education Details SLOP Strategies   ? Person(s) Educated Patient   ? Methods Explanation   ? Comprehension Verbalized understanding   ? ?  ?  ? ?  ? ? ? SLP Short Term Goals - 02/20/22 1726   ? ?  ? SLP SHORT TERM GOAL #1  ? Title Pt will use aphasia compensations during 5-8 minutes conversation with occasional min cues.   ? Time 10   ? Period --   sessions  ? Status Achieved   ?  ? SLP SHORT TERM GOAL #2  ? Title Pt will demonstrate dysarthria HEP with rare min cues.   ? Time 10   ? Period --   sessions  ? Status Achieved   ?  ? SLP SHORT TERM GOAL #3  ? Title Patient will use compensations for dysarthria in sentence responses for >95% intelligibility.   ? Time 10   ? Period --   sessions  ? Status Achieved   ?  ? SLP SHORT TERM GOAL #4  ? Title Pt will complete further assessment of cognitive-communication PRN.   ? Time 10   ? Period --   sessions  ? Status Achieved   ?  ? SLP SHORT TERM GOAL #5  ? Title Patient will use compensations for dysarthria in sentence-level reading for >95% intelligibility.   ? Time 10   ? Period --   sessions  ? Status Partially Met   revised after worsening symptoms, possible new CVA on 01/21/22  ?  ? SLP SHORT TERM GOAL #6  ? Title Patient will demonstrate alternating attention over 5 minutes between 2 simple cognitive-linguistic task with 90% accuracy.   ? Time 10   ? Period --   sessions  ? Status Deferred   Deferred due to patient's preference for focus on intelligibility  ? ?  ?  ? ?  ? ? ? SLP Long Term Goals - 02/20/22 1730   ? ?  ? SLP LONG TERM GOAL #1  ? Title Pt will use aphasia compensations effectively during 15 minutes mod complex conversation with modified independence.   ? Time 12   ? Period Weeks   ? Status Revised   revised after worsening symptoms, possible new CVA on 01/21/22  ?  ? SLP LONG TERM GOAL #2  ? Title Pt will use aphasia compensations with modified independence to resolve communication breakdown  at least 3 occasions outside of ST, per pt/spouse report.   ? Time 12   ? Period Weeks   ? Status On-going   ?  ? SLP LONG TERM GOAL #3  ? Title Patient will use compensations for dysarthria in 10 minute conversation for >80% intelligibility with no more than 2 repetitions necessary.   ? Time 12   ?  Period Weeks   ? Status Revised   Revised due to progress/pt's demands  ?  ? SLP LONG TERM GOAL #4  ? Title Patient will demonstrate swallowing precautions independently x3 sessions with no overt s/sx aspiration.   ? Time 12   ? Period Weeks   ? Status On-going   ? ?  ?  ? ?  ? ? ? Plan - 03/06/22 1233   ? ? Clinical Impression Statement Patient continues with moderate dysarthria, mild aphasia, and cognitive communication deficits. Noted pt ED visit for fall this past weekend. CT head showed no acute findings. Current goals remain appropriate. Pt continues to improve intelligibility at sentence level. Pt receptive to using dysarthria strategies during tasks. Continue to recommend using strategies outside of ST to improve carryover. I recommend continued skilled ST to improve intelligibility, verbal expression and swallowing safety necessary for increased communicatory success and independence in the home and community environments.   ? Speech Therapy Frequency 2x / week   ? Duration 12 weeks   ? Treatment/Interventions Aspiration precaution training;Environmental controls;Language facilitation;Cueing hierarchy;Oral motor exercises;SLP instruction and feedback;Pharyngeal strengthening exercises;Compensatory techniques;Cognitive reorganization;Functional tasks;Compensatory strategies;Diet toleration management by SLP;Internal/external aids;Multimodal communcation approach;Patient/family education   ? Potential to Achieve Goals Good   ? Consulted and Agree with Plan of Care Family member/caregiver;Patient   ? ?  ?  ? ?  ? ?Patient will benefit from skilled therapeutic intervention in order to improve the following deficits  and impairments:   ?Dysarthria and anarthria ? ?Aphasia ? ? ? ?Problem List ?Patient Active Problem List  ? Diagnosis Date Noted  ? HLD (hyperlipidemia) 01/21/2022  ? Essential tremor 01/21/2022  ? Stroke-

## 2022-03-06 NOTE — Therapy (Signed)
[place] [facility] ?[place], [place], [address] ?Phone: [phone]   Fax:  [phone] ? ?Physical Therapy Treatment ? ?Patient Details  ?Name: Casey Adkins ?MRN: [id number] ?Date of Birth: Apr 12, 1934 ?Referring Provider (PT): Dr. Emily Filbert ? ? ?Encounter Date: 03/06/2022 ? ? PT End of Session - 03/06/22 0850   ? ? Visit Number 7   ? Number of Visits 25   ? Date for PT Re-Evaluation 04/25/22   ? Authorization Time Period initial cert 04/26/7034- 0/0/9381   ? Progress Note Due on Visit 10   ? PT Start Time [phone]   ? PT Stop Time [id number]   ? PT Time Calculation (min) 41 min   ? Equipment Utilized During Treatment Gait belt   ? Activity Tolerance Patient tolerated treatment well   ? Behavior During Therapy [facility] for tasks assessed/performed   ? ?  ?  ? ?  ? ? ?Past Medical History:  ?Diagnosis Date  ? Allergic rhinitis   ? Arthritis   ? Cancer [facility])   ? Bladder Cancer  ? Cancer [facility])   ? Prostate Cancer  ? Diabetes mellitus without complication ([place])   ? Diverticulosis   ? Dysphagia   ? GERD (gastroesophageal reflux disease)   ? History of kidney stones   ? h/o  ? Hypertension   ? Stroke [facility]) 2006  ? possible light stroke-no deficits  ? ? ?Past Surgical History:  ?Procedure Laterality Date  ? CYSTOSCOPY W/ RETROGRADES Bilateral 06/07/2019  ? Procedure: CYSTOSCOPY WITH RETROGRADE PYELOGRAM;  Surgeon: Hollice Espy, MD;  Location: [facility] ORS;  Service: Urology;  Laterality: Bilateral;  ? CYSTOSCOPY WITH BIOPSY N/A 06/07/2019  ? Procedure: CYSTOSCOPY WITH Bladder BIOPSY;  Surgeon: Hollice Espy, MD;  Location: [facility] ORS;  Service: Urology;  Laterality: N/A;  ? ESOPHAGOGASTRODUODENOSCOPY N/A 04/21/2015  ? Procedure: ESOPHAGOGASTRODUODENOSCOPY (EGD);  Surgeon: Hulen Luster, MD;  Location: [facility] ENDOSCOPY;  Service: Gastroenterology;  Laterality: N/A;  ? LITHOTRIPSY    ? SINUS SURGERY WITH INSTATRAK  2008  ? STAPEDES SURGERY Left age 61  ? 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 TONSILLECTOMY    ? ? ?There were no vitals filed for this visit. ? ? Subjective Assessment - 03/06/22 0848   ? ? Subjective Pt reports he had a fall when he was going to the bathroom when he got up in the middle of the night. Pt reports there was adequate light and no objects in the floor.   ? Patient is accompained by: Family member   ? Pertinent History Casey Adkins is an 88 y.o. male referred by his PCP Dr. Emily Filbert for weakness. Pt was admitted on 10/10/21- 10/11/21 for CVA. 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 Unable to obtain MRI due to ear implant. Hx of esophageal dysphagia requiring dilation in the past; reports 1 year hx of worsening dysphagia prior to CVA.   ? How long can you sit comfortably? No issues   ? How long can you stand comfortably? Not limited as long as holding onto something   ? How long can you walk comfortably? < 15 min   ? Patient Stated Goals Walk without losing my balance and no falls   ? ?  ?  ? ?  ? ? ?Exercise/Activity Sets/Reps/Time/ Resistance Assistance Charge type Comments  ?Rocker board  X 1  min UE ?X 1 min no UE A  CGA NMR  To irmpove balance recovery posteriorly   ?Airex step up and step down ( anterior)  X 10 ea  CGA NMR  To improve posterior recovery of balance, cues for R LE clearance with both exercises.   ?Airex lateral step up / down  X 10 ea direction CGA   NMR  Continued difficulty with R LE foot clearance, improved with practice  ?SLS progression 1 foot airex and contra ( L) on 6 in step  X 45 sec lat head turns, 45 sec, x 45 sec EC  CGA NMR  To improve balance with less visual input for body awareness   ?Cone obstacle course  X 2 laps through  CGA  NMR To improve balance with turns without AD, pt hits cone with involved ( RLE) on a few  occassions.   ?      ?      ?      ?      ?      ?      ?Treatment Provided this session  ? ?Pt educated  throughout session about proper posture and technique with exercises. Improved exercise technique, movement at target joints, use of target muscles after min to mod verbal, visual, tactile cues. ?Note: Portions of this document were prepared using Dragon voice recognition software and although reviewed may contain unintentional dictation errors in syntax, grammar, or spelling. ? ? ? ? ? ? ? ? ? ? ? ? ? ? ? ? ? ? ? ? ? ? ? ? ? ? ? PT Education - 03/06/22 0850   ? ? Education Details exercise technique   ? Person(s) Educated Patient   ? Methods Explanation;Demonstration;Tactile cues;Verbal cues   ? Comprehension Verbalized understanding;Returned demonstration;Verbal cues required   ? ?  ?  ? ?  ? ? ? PT Short Term Goals - 02/01/22 1215   ? ?  ? PT SHORT TERM GOAL #1  ? Title Pt will be independent with initial HEP in order to improve strength and balance in order to decrease fall risk and improve function at home and work.   ? Baseline Eval= No formal HEP in place   ? Time 6   ? Period Weeks   ? Status New   ? Target Date 03/14/22   ? ?  ?  ? ?  ? ? ? ? PT Long Term Goals - 02/01/22 1215   ? ?  ? PT LONG TERM GOAL #1  ? Title Pt will be independent with final HEP in order to improve strength and balance in order to decrease fall risk and improve function at home and work.   ? Baseline Eval= No formal HEP in place   ? Time 12   ? Period Weeks   ? Status New   ? Target Date 04/25/22   ?  ? PT LONG TERM GOAL #2  ? Title Pt will improve FOTO to target score of to display perceived improvements in ability to complete ADL's.   ? Baseline Eval= 60   ? Time 12   ? Period Weeks   ? Status New   ? Target Date 04/25/22   ?  ? PT LONG TERM GOAL #3  ? Title Pt will improve BERG by at least 3 points in order to demonstrate clinically significant improvement in balance.   ? Baseline Eval= 36/56   ? Time 12   ?  Period Weeks   ? Status New   ? Target Date 04/25/22   ?  ? PT LONG TERM GOAL #4  ? Title Pt will decrease 5TSTS by at least  3 seconds in order to demonstrate clinically significant improvement in LE strength.   ? Baseline Eval= 23.3 sec with BUE Support   ? Time 12   ? Period Weeks   ? Status New   ? Target Date 04/25/22   ?  ? PT LONG TERM GOAL #5  ? Title Pt will decrease TUG to below 20 seconds/decrease in order to demonstrate decreased fall risk.   ? Baseline Eval= 31.44 with use of RW   ? Time 12   ? Period Weeks   ? Status New   ? Target Date 04/25/22   ?  ? Additional Long Term Goals  ? Additional Long Term Goals Yes   ?  ? PT LONG TERM GOAL #6  ? Title Pt will increase 10MWT by at least 0.13 m/s in order to demonstrate clinically significant improvement in community ambulation.   ? Baseline Eval= 0.62 m/s using RW   ? Time 12   ? Period Weeks   ? Status New   ? Target Date 04/25/22   ? ?  ?  ? ?  ? ? ? ? ? ? ? ? Plan - 03/06/22 0851   ? ? Clinical Impression Statement Pt presents with good motivation for PT interventions this session. Today's treatmetn session focused on balance recovery posteriorlly to decrease risk of falling backwards seocndary to fall he sustained over the weekend. Pt also has difficulty with turning and balance obstacle course introduced to address this impairment. Difficulty with R LE hitting objects with this task but no LOB noted, slow and deliberate movements with this task. Pt will continue to benefit from skilled Pt interventios to ipmorve his LE balance, stability and reduce risk of falls.   ? Personal Factors and Comorbidities Age;Comorbidity 3+   ? Comorbidities Diabetes, CVA, HTN   ? Examination-Activity Limitations Caring for Others;Lift;Squat;Stairs;Transfers   ? Examination-Participation Restrictions Cleaning;Community Activity;[place] [employer] Work   ? Stability/Clinical Decision Making Evolving/Moderate complexity   ? Rehab Potential Good   ? PT Frequency 2x / week   ? PT Duration 12 weeks   ? PT Treatment/Interventions ADLs/Self Care Home Management;Canalith Repostioning;Cryotherapy;Electrical  Stimulation;Moist Heat;Traction;Ultrasound;DME Instruction;Gait training;Stair training;Functional mobility training;Therapeutic activities;Therapeutic exercise;Balance training;Neuromuscular re-education;Patient/famil

## 2022-03-07 NOTE — Therapy (Signed)
[place] [facility] ?[place], [place], [address] ?Phone: [phone]   Fax:  [phone] ? ?Occupational Therapy Treatment ? ?Patient Details  ?Name: Casey Adkins ?MRN: [id number] ?Date of Birth: 1934/08/17 ?Referring Provider (OT): Dr. Emily Filbert, PCP ? ? ?Encounter Date: 03/06/2022 ? ? OT End of Session - 03/07/22 0816   ? ? Visit Number 8   ? Number of Visits 12   ? Date for OT Re-Evaluation 03/13/22   ? Authorization Type Progress reporting period starting 01/31/2022   ? OT Start Time 0930   ? OT Stop Time 1015   ? OT Time Calculation (min) 45 min   ? Activity Tolerance Patient tolerated treatment well   ? Behavior During Therapy [facility] for tasks assessed/performed   ? ?  ?  ? ?  ? ? ?Past Medical History:  ?Diagnosis Date  ? Allergic rhinitis   ? Arthritis   ? Cancer [facility])   ? Bladder Cancer  ? Cancer [facility])   ? Prostate Cancer  ? Diabetes mellitus without complication ([place])   ? Diverticulosis   ? Dysphagia   ? GERD (gastroesophageal reflux disease)   ? History of kidney stones   ? h/o  ? Hypertension   ? Stroke [facility]) 2006  ? possible light stroke-no deficits  ? ? ?Past Surgical History:  ?Procedure Laterality Date  ? CYSTOSCOPY W/ RETROGRADES Bilateral 06/07/2019  ? Procedure: CYSTOSCOPY WITH RETROGRADE PYELOGRAM;  Surgeon: Hollice Espy, MD;  Location: [facility] ORS;  Service: Urology;  Laterality: Bilateral;  ? CYSTOSCOPY WITH BIOPSY N/A 06/07/2019  ? Procedure: CYSTOSCOPY WITH Bladder BIOPSY;  Surgeon: Hollice Espy, MD;  Location: [facility] ORS;  Service: Urology;  Laterality: N/A;  ? ESOPHAGOGASTRODUODENOSCOPY N/A 04/21/2015  ? Procedure: ESOPHAGOGASTRODUODENOSCOPY (EGD);  Surgeon: Hulen Luster, MD;  Location: [facility] ENDOSCOPY;  Service: Gastroenterology;  Laterality: N/A;  ? LITHOTRIPSY    ? SINUS SURGERY WITH INSTATRAK  2008  ? STAPEDES SURGERY Left age 74  ? 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 TONSILLECTOMY    ? ? ?There were no  vitals filed for this visit. ? ? Subjective Assessment - 03/06/22 1724   ? ? Subjective  Spouse reports they were in the ED over the weekend for a fall that pt had.   ? 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  Unable to confirm another stroke via MRI d/t ear implant.   ? Limitations R sided weakness, lack of coordination, impaired balance, slurred speech   ? Patient Stated Goals To get back to bowling.   ? Currently in Pain? No/denies   ? Pain Score 0-No pain   ? ?  ?  ? ?  ? ?Occupational Therapy Treatment: ?Therapeutic Activity: ?Pt participated in functional reaching for cones on elevated mat table.  Pt stood to reach and place cones forward, laterally R/L, and slightly behind pt to incorporate trunk rotation, all reaching outside BOS without UE support and SBA, feet shoulder width apart.  OT provided vc for maximizing reach.  Pt participated in simulated bowling activity, initially going through the motions to roll a bowling ball with focus on maintaining balance with his forward 4 step  intro before rolling, working up to rolling 6# ball toward a target.  Pt had lateral support each side within reach, reaching out to the right support <25% of the time, and therapist providing min guard-close SBA with and without the ball.   ? ?Response to Treatment: ?See Plan/clinical impression below. ? ? OT Education - 03/06/22 0815   ? ? Education Details dynamic standing activities   ? Person(s) Educated Patient;Spouse   ? Methods Explanation;Verbal cues;Demonstration;Tactile cues   ? Comprehension Verbalized understanding;Need further instruction;Returned demonstration;Verbal cues required;Tactile cues required   ? ?  ?  ? ?   ? ? ? OT Short Term Goals - 02/01/22 1514   ? ?  ? OT SHORT TERM GOAL #1  ? Title Pt will perform HEP for increasing hand coordination with min vc.   ? Baseline Eval: initiated HEP at eval, further training needed   ? Time 3   ? Period Weeks   ? Status New   ? Target Date 02/20/22   ? ?  ?  ? ?  ? ? ? ? OT Long Term Goals - 02/01/22 1514   ? ?  ? OT LONG TERM GOAL #1  ? Title Pt will increase FOTO score by 2 or more points to indicate increased functional performance.   ? Baseline Eval: FOTO 60   ? Time 6   ? Period Weeks   ? Status New   ? Target Date 03/13/22   ?  ? OT LONG TERM GOAL #2  ? Title Pt will increase R hand coordination to manage eating utensils with good control.   ? Baseline Eval: Pt reports being clumsy with utensils   ? Time 6   ? Period Weeks   ? Status New   ? Target Date 03/13/22   ?  ? OT LONG TERM GOAL #3  ? Title Pt will increase R hand dexterity for better manipulation of ADL supplies as noted by <60 sec to complete 9 hole peg test.   ? Baseline Eval: R 1 min 7 sec, L 39 sec   ? Time 6   ? Period Weeks   ? Status New   ? Target Date 03/13/22   ?  ? OT LONG TERM GOAL #4  ? Title Pt will perform bathroom transfers with modified indep-indep.   ? Baseline Eval: spouse supervises all functional transfers; pt using RW for support.   ? Time 6   ? Period Weeks   ? Status New   ? Target Date 03/13/22   ?  ? OT LONG TERM GOAL #5  ? Title Pt will increase dynamic standing balance to enable pt to roll a light weight (5lbs or less) ball independently to work towards return to bowling.   ? Baseline Eval: unable to participate in bowling (pt's primary leisure activity)   ? Time 6   ? Period Weeks   ? Status New   ? Target Date 03/13/22   ? ?  ?  ? ?  ? ? ? Plan - 03/06/22 0838   ? ? Clinical Impression Statement Pt was in the ED over the weekend for a fall at home which he sustained in the middle of the night when he got up to go to the bathroom.  Pt reports he got a dizzy spell and fell, hitting R side  of his head which required 4 staples.  Pt reported no pain today.  OT reviewed fall prevention, including making slow positional  changes, making sure lights are on when up at night, and keeping AD at bedside for amb to bathroom at night.  Pt reports good compliance with all, except that he does not use an AD to reach the bathroom as he states it's about a 2-3 step distance from his bed and he can hold to the walls.  Pt tolerated functional reaching well today, performing reach forward, laterally, and slightly behind him with trunk rotation all without LOB, close SBA.  Pt occasionally requires unilateral support when adjusting foot position between reach.  Pt tolerated bowling simulation well, requiring intermittent min guard, constant SBA, occasional unilateral support to the R when releasing ball to roll.  Pt will continue to benefit from skilled OT for improving dynamic standing balance in order decrease fall risk with daily tasks and work towards being able to bowl again.   ? OT Occupational Profile and History Problem Focused Assessment - Including review of records relating to presenting problem   ? Occupational performance deficits (Please refer to evaluation for details): ADL's;IADL's;Leisure   ? Body Structure / Function / Physical Skills ADL;Coordination;GMC;UE functional use;Balance;IADL;Dexterity;FMC;Mobility;Gait   ? Rehab Potential Good   ? Clinical Decision Making Limited treatment options, no task modification necessary   ? Comorbidities Affecting Occupational Performance: May have comorbidities impacting occupational performance   ? Modification or Assistance to Complete Evaluation  No modification of tasks or assist necessary to complete eval   ? OT Frequency 2x / week   ? OT Duration 6 weeks   ? OT Treatment/Interventions Self-care/ADL training;Therapeutic exercise;DME and/or AE instruction;Functional Mobility Training;Balance training;Neuromuscular education;Therapeutic activities;Patient/family  education   ? OT Home Exercise Plan initiated hand coordination exercises at eval   ? Consulted and Agree with Plan of Care Family member/caregiver;Patient   ? Family Member Consulted spouse, Manus Gunning   ?

## 2022-03-08 ENCOUNTER — Ambulatory Visit: Payer: Medicare Other | Admitting: Physical Therapy

## 2022-03-08 ENCOUNTER — Encounter: Payer: Medicare Other | Admitting: Occupational Therapy

## 2022-03-11 ENCOUNTER — Ambulatory Visit: Payer: Medicare Other | Admitting: Speech Pathology

## 2022-03-11 DIAGNOSIS — R471 Dysarthria and anarthria: Secondary | ICD-10-CM

## 2022-03-11 DIAGNOSIS — R4701 Aphasia: Secondary | ICD-10-CM | POA: Diagnosis not present

## 2022-03-11 NOTE — Therapy (Signed)
[place] ?[facility] ?[address] ?[place], [place], [address] ?Phone: [phone]   Fax:  [phone] ? ?Speech Language Pathology Treatment ? ?Patient Details  ?Name: Casey Adkins ?MRN: [id number] ?Date of Birth: 08/09/1934 ?Referring Provider (SLP): Miller, Mark F, MD ? ? ?Encounter Date: 03/11/2022 ? ? End of Session - 03/11/22 1044   ? ? Visit Number 24   ? Number of Visits 37   ? Date for SLP Re-Evaluation 04/28/22   ? SLP Start Time 0854   ? SLP Stop Time  0957   ? SLP Time Calculation (min) 63 min   ? Activity Tolerance Patient tolerated treatment well   ? ?  ?  ? ?  ? ? ?Past Medical History:  ?Diagnosis Date  ? Allergic rhinitis   ? Arthritis   ? Cancer (HCC)   ? Bladder Cancer  ? Cancer (HCC)   ? Prostate Cancer  ? Diabetes mellitus without complication (HCC)   ? Diverticulosis   ? Dysphagia   ? GERD (gastroesophageal reflux disease)   ? History of kidney stones   ? h/o  ? Hypertension   ? Stroke (HCC) 2006  ? possible light stroke-no deficits  ? ? ?Past Surgical History:  ?Procedure Laterality Date  ? CYSTOSCOPY W/ RETROGRADES Bilateral 06/07/2019  ? Procedure: CYSTOSCOPY WITH RETROGRADE PYELOGRAM;  Surgeon: Brandon, Ashley, MD;  Location: [facility] ORS;  Service: Urology;  Laterality: Bilateral;  ? CYSTOSCOPY WITH BIOPSY N/A 06/07/2019  ? Procedure: CYSTOSCOPY WITH Bladder BIOPSY;  Surgeon: Brandon, Ashley, MD;  Location: [facility] ORS;  Service: Urology;  Laterality: N/A;  ? ESOPHAGOGASTRODUODENOSCOPY N/A 04/21/2015  ? Procedure: ESOPHAGOGASTRODUODENOSCOPY (EGD);  Surgeon: Paul Y Oh, MD;  Location: [facility] ENDOSCOPY;  Service: Gastroenterology;  Laterality: N/A;  ? LITHOTRIPSY    ? SINUS SURGERY WITH INSTATRAK  2008  ? STAPEDES SURGERY Left age 42  ? 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 TONSILLECTOMY    ? ? ?There were no vitals filed for this visit. ? ? Subjective Assessment - 03/11/22 1041   ? ? Subjective "I had a good weekend"   ? Patient  is accompained by: Family member   Spouse present for session  ? Currently in Pain? No/denies   ? Pain Score 0-No pain   ? ?  ?  ? ?  ? ? ? ? ? ? ? ? ADULT SLP TREATMENT - 03/11/22 1044   ? ?  ? General Information  ? Behavior/Cognition Alert;Cooperative;Pleasant mood   ? HPI Casey Adkins is an 87 y.o. male referred for MBS by his PCP Dr. Mark Miller. Pt admitted 10/10/21- 10/11/21 for CVA. 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 Unable to obtain MRI due to ear implant. Hx of esophageal dysphagia requiring dilation in the past; reports 1 year hx of worsening dysphagia prior to CVA. Neurology noted right upper motor neuron pattern weakness, suspected small vessel lacunar infarction. 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 Wife assists with history given pt's dysarthria, aphasia.   ?  ? Treatment Provided  ? Treatment provided Cognitive-Linquistic   ?  ? Cognitive-Linquistic Treatment  ? Treatment focused on Dysarthria;Aphasia;Patient/family/caregiver education   ? Skilled Treatment Reviewed dysarthria strategies (SLOP). Targeted dysarthria with sentence level general information questions. Pt read and asked questions to graduate clinician with 95% intelligibility with occasional min cues. Pt required cues   to swallow saliva. Pt provided responses to simple personal questions with 85% intelligibility, improved to 95% intelligibility with min cues. Pt benefited from cues to use dysarthria strategies (slow down, pause) and to swallow saliva. Dysarthria targeted during single paragraph level reading task on Ipad. During reading task, pt intelligibility ~60% acc, improved to ~80% accuracy with mod cues. Pt required cues to slow down and repeat unintelligible speech. Several paraphasias noted with pt intermittently aware. Pt answered comprehension questions with 95% (19/20) with min-mod cues for  double checking. Targeted word finding with verbal description task (modified taboo). Pt provided verbal descriptions with ~70% accuracy, increased to 90% accuracy with mod cues to utilize semantic features. Pt encouraged to attempt providing descriptions for more challenging words instead of skipping to the next word (location, association, category).   ?  ? Assessment / Recommendations / Plan  ? Plan Continue with current plan of care   ?  ? Progression Toward Goals  ? Progression toward goals Progressing toward goals   ? ?  ?  ? ?  ? ? ? SLP Education - 03/11/22 1042   ? ? Education Details SLOP strategies, Semantic Feature Analysis   ? Person(s) Educated Patient   ? Methods Explanation   ? Comprehension Verbalized understanding   ? ?  ?  ? ?  ? ? ? SLP Short Term Goals - 02/20/22 1726   ? ?  ? SLP SHORT TERM GOAL #1  ? Title Pt will use aphasia compensations during 5-8 minutes conversation with occasional min cues.   ? Time 10   ? Period --   sessions  ? Status Achieved   ?  ? SLP SHORT TERM GOAL #2  ? Title Pt will demonstrate dysarthria HEP with rare min cues.   ? Time 10   ? Period --   sessions  ? Status Achieved   ?  ? SLP SHORT TERM GOAL #3  ? Title Patient will use compensations for dysarthria in sentence responses for >95% intelligibility.   ? Time 10   ? Period --   sessions  ? Status Achieved   ?  ? SLP SHORT TERM GOAL #4  ? Title Pt will complete further assessment of cognitive-communication PRN.   ? Time 10   ? Period --   sessions  ? Status Achieved   ?  ? SLP SHORT TERM GOAL #5  ? Title Patient will use compensations for dysarthria in sentence-level reading for >95% intelligibility.   ? Time 10   ? Period --   sessions  ? Status Partially Met   revised after worsening symptoms, possible new CVA on 01/21/22  ?  ? SLP SHORT TERM GOAL #6  ? Title Patient will demonstrate alternating attention over 5 minutes between 2 simple cognitive-linguistic task with 90% accuracy.   ? Time 10   ? Period --    sessions  ? Status Deferred   Deferred due to patient's preference for focus on intelligibility  ? ?  ?  ? ?  ? ? ? SLP Long Term Goals - 02/20/22 1730   ? ?  ? SLP LONG TERM GOAL #1  ? Title Pt will use aphasia compensations effectively during 15 minutes mod complex conversation with modified independence.   ? Time 12   ? Period Weeks   ? Status Revised   revised after worsening symptoms, possible new CVA on 01/21/22  ?  ? SLP LONG TERM GOAL #2  ? Title Pt will   use aphasia compensations with modified independence to resolve communication breakdown at least 3 occasions outside of ST, per pt/spouse report.   ? Time 12   ? Period Weeks   ? Status On-going   ?  ? SLP LONG TERM GOAL #3  ? Title Patient will use compensations for dysarthria in 10 minute conversation for >80% intelligibility with no more than 2 repetitions necessary.   ? Time 12   ? Period Weeks   ? Status Revised   Revised due to progress/pt's demands  ?  ? SLP LONG TERM GOAL #4  ? Title Patient will demonstrate swallowing precautions independently x3 sessions with no overt s/sx aspiration.   ? Time 12   ? Period Weeks   ? Status On-going   ? ?  ?  ? ?  ? ? ? Plan - 03/11/22 1105   ? ? Clinical Impression Statement Patient continues with moderate dysarthria, mild aphasia, and cognitive communication deficits. Pt continues to be receptive to using dysarthria and word-finding strategies in therapy tasks. Noted pt using dysarthria strategies during conversations with spouse between tasks. Pt requires cues for consistent usage of word-finding strategies. Pt would benefit from continued skilled training to utilize dysarthria and aphasia compensations outside of ST.  I recommend continued skilled ST to improve intelligibility, verbal expression and swallowing safety necessary for increased communicatory success and independence in the home and community environments.   ? Speech Therapy Frequency 2x / week   ? Duration 12 weeks   ? Treatment/Interventions  Aspiration precaution training;Environmental controls;Language facilitation;Cueing hierarchy;Oral motor exercises;SLP instruction and feedback;Pharyngeal strengthening exercises;Compensatory techniques;Cognitive

## 2022-03-13 ENCOUNTER — Ambulatory Visit: Payer: Medicare Other | Admitting: Speech Pathology

## 2022-03-13 DIAGNOSIS — R4701 Aphasia: Secondary | ICD-10-CM | POA: Diagnosis not present

## 2022-03-13 DIAGNOSIS — R471 Dysarthria and anarthria: Secondary | ICD-10-CM

## 2022-03-13 NOTE — Therapy (Signed)
[place] ?[facility] ?[place], [place], [address] ?Phone: [phone]   Fax:  [phone] ? ?Speech Language Pathology Treatment ? ?Patient Details  ?Name: Casey Adkins ?MRN: [id number] ?Date of Birth: 01/08/1934 ?Referring Provider (SLP): Rusty Aus, MD ? ? ?Encounter Date: 03/13/2022 ? ? End of Session - 03/13/22 1042   ? ? Visit Number 25   ? Number of Visits 37   ? Date for SLP Re-Evaluation 04/28/22   ? SLP Start Time 0900   ? SLP Stop Time  1000   ? SLP Time Calculation (min) 60 min   ? Activity Tolerance Patient tolerated treatment well   ? ?  ?  ? ?  ? ? ?Past Medical History:  ?Diagnosis Date  ? Allergic rhinitis   ? Arthritis   ? Cancer [facility])   ? Bladder Cancer  ? Cancer [facility])   ? Prostate Cancer  ? Diabetes mellitus without complication ([place])   ? Diverticulosis   ? Dysphagia   ? GERD (gastroesophageal reflux disease)   ? History of kidney stones   ? h/o  ? Hypertension   ? Stroke [facility]) 2006  ? possible light stroke-no deficits  ? ? ?Past Surgical History:  ?Procedure Laterality Date  ? CYSTOSCOPY W/ RETROGRADES Bilateral 06/07/2019  ? Procedure: CYSTOSCOPY WITH RETROGRADE PYELOGRAM;  Surgeon: Hollice Espy, MD;  Location: [facility] ORS;  Service: Urology;  Laterality: Bilateral;  ? CYSTOSCOPY WITH BIOPSY N/A 06/07/2019  ? Procedure: CYSTOSCOPY WITH Bladder BIOPSY;  Surgeon: Hollice Espy, MD;  Location: [facility] ORS;  Service: Urology;  Laterality: N/A;  ? ESOPHAGOGASTRODUODENOSCOPY N/A 04/21/2015  ? Procedure: ESOPHAGOGASTRODUODENOSCOPY (EGD);  Surgeon: Hulen Luster, MD;  Location: [facility] ENDOSCOPY;  Service: Gastroenterology;  Laterality: N/A;  ? LITHOTRIPSY    ? SINUS SURGERY WITH INSTATRAK  2008  ? STAPEDES SURGERY Left age 19  ? 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 TONSILLECTOMY    ? ? ?There were no vitals filed for this visit. ? ? Subjective Assessment - 03/13/22 1012   ? ? Subjective "How much longer do I have to come  here?"   ? Patient is accompained by: Family member   Spouse present for session  ? Currently in Pain? No/denies   ? Pain Score 0-No pain   ? ?  ?  ? ?  ? ? ? ? ? ? ? ? ADULT SLP TREATMENT - 03/13/22 1017   ? ?  ? General Information  ? Behavior/Cognition Alert;Cooperative;Pleasant mood   ? HPI Casey Adkins is an 88 y.o. male referred for MBS by his PCP Dr. Emily Filbert. Pt admitted 10/10/21- 10/11/21 for CVA. 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 Unable to obtain MRI due to ear implant. Hx of esophageal dysphagia requiring dilation in the past; reports 1 year hx of worsening dysphagia prior to CVA. Neurology noted right upper motor neuron pattern weakness, suspected small vessel lacunar infarction. 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 Wife assists with history given pt's dysarthria, aphasia.   ?  ? Treatment Provided  ? Treatment provided Cognitive-Linquistic   ?  ? Cognitive-Linquistic Treatment  ? Treatment focused on Dysarthria;Aphasia;Patient/family/caregiver education   ? Skilled Treatment At beginning of session, pt inquired about his remaining ST appointments and timeline of therapy. Pt agrees he is benefitting from therapy, however feels he is  approaching satisfaction with functional level. Worked with pt on plan to reduce frequency beginning in May to 1x per week, with likely d/c in 4-6 weeks. Pt/spouse agreed with plan. Pt reports difficulty with swallowing when he takes big sips and when he is leaning back in his chair. Reinforced swallowing precautions (sitting upright, taking small sips).  Targeted dysarthria with single paragraph level reading tasks. During task, pt read single paragraphs with ~90% accuracy with min cues. Pt benefited from cues to use dysarthria compensations. Increased to multiple paragraphs, pt read an article of his interest (baseball) with ~75%  intelligibility, increased to ~85% intelligibility with initial mod cues, faded to min cues. Pt benefited from cues to slow down and swallow saliva. Toward end of task, noted pt increasing self-monitoring and repeating unintelligible speech on several occasions. Targeted naming/wordfinding with verbal categorization task (5-second rule game). With modified task (eliminating time limit), pt listed ~6 items per category with 90% accuracy with min-mod cues to use word-finding strategies.   ?  ? Assessment / Recommendations / Plan  ? Plan Continue with current plan of care   ?  ? Progression Toward Goals  ? Progression toward goals Progressing toward goals   ? ?  ?  ? ?  ? ? ? SLP Education - 03/13/22 1015   ? ? Education Details Swallow precautions, pt's progress/ discharge plan   ? Person(s) Educated Patient;Spouse   ? Methods Explanation   ? Comprehension Verbalized understanding   ? ?  ?  ? ?  ? ? ? SLP Short Term Goals - 02/20/22 1726   ? ?  ? SLP SHORT TERM GOAL #1  ? Title Pt will use aphasia compensations during 5-8 minutes conversation with occasional min cues.   ? Time 10   ? Period --   sessions  ? Status Achieved   ?  ? SLP SHORT TERM GOAL #2  ? Title Pt will demonstrate dysarthria HEP with rare min cues.   ? Time 10   ? Period --   sessions  ? Status Achieved   ?  ? SLP SHORT TERM GOAL #3  ? Title Patient will use compensations for dysarthria in sentence responses for >95% intelligibility.   ? Time 10   ? Period --   sessions  ? Status Achieved   ?  ? SLP SHORT TERM GOAL #4  ? Title Pt will complete further assessment of cognitive-communication PRN.   ? Time 10   ? Period --   sessions  ? Status Achieved   ?  ? SLP SHORT TERM GOAL #5  ? Title Patient will use compensations for dysarthria in sentence-level reading for >95% intelligibility.   ? Time 10   ? Period --   sessions  ? Status Partially Met   revised after worsening symptoms, possible new CVA on 01/21/22  ?  ? SLP SHORT TERM GOAL #6  ? Title Patient  will demonstrate alternating attention over 5 minutes between 2 simple cognitive-linguistic task with 90% accuracy.   ? Time 10   ? Period --   sessions  ? Status Deferred   Deferred due to patient's preference for focus on intelligibility  ? ?  ?  ? ?  ? ? ? SLP Long Term Goals - 02/20/22 1730   ? ?  ? SLP LONG TERM GOAL #1  ? Title Pt will use aphasia compensations effectively during 15 minutes mod complex conversation with modified independence.   ? Time 12   ?  Period Weeks   ? Status Revised   revised after worsening symptoms, possible new CVA on 01/21/22  ?  ? SLP LONG TERM GOAL #2  ? Title Pt will use aphasia compensations with modified independence to resolve communication breakdown at least 3 occasions outside of ST, per pt/spouse report.   ? Time 12   ? Period Weeks   ? Status On-going   ?  ? SLP LONG TERM GOAL #3  ? Title Patient will use compensations for dysarthria in 10 minute conversation for >80% intelligibility with no more than 2 repetitions necessary.   ? Time 12   ? Period Weeks   ? Status Revised   Revised due to progress/pt's demands  ?  ? SLP LONG TERM GOAL #4  ? Title Patient will demonstrate swallowing precautions independently x3 sessions with no overt s/sx aspiration.   ? Time 12   ? Period Weeks   ? Status On-going   ? ?  ?  ? ?  ? ? ? Plan - 03/13/22 1042   ? ? Clinical Impression Statement Patient continues with moderate dysarthria, mild aphasia, and cognitive communication deficits. Pt improving intelligibility at the paragraph level; noticing pt increasing self-monitoring and correction during structured tasks. Pt receptive to using dysarthria compensations. Noted pt continuing to use dysarthria strategies when conversating with his spouse. Pt initiated conversation about discharge planning today. Discussed pt's progress and improvement. Plan to decrease frequency to 1x a week after next 2 sessions. Pt/spouse in agreement of plan. I recommend continued skilled ST to improve  intelligibility, verbal expression and swallowing safety necessary for increased communicatory success and independence in the home and community environments.   ? Speech Therapy Frequency 2x / week   ? Duration 12 w

## 2022-03-15 ENCOUNTER — Ambulatory Visit: Payer: Medicare Other | Admitting: Physical Therapy

## 2022-03-18 ENCOUNTER — Ambulatory Visit: Payer: Medicare Other | Admitting: Speech Pathology

## 2022-03-18 NOTE — Progress Notes (Signed)
? ?  03/19/2022 ?CC:  ?Chief Complaint  ?Patient presents with  ? Cysto  ? ? ?HPI: ?Casey Adkins is a 88 y.o. male  with a personal history of bladder cancer, elevated PSA, and urinary urgency, who presents today for cystoscopy.  ? 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 ?  ?09/24/2016 was his most recent recurrence of CIS he underwent follow-up fulguration of this lesion in the office on 11/20/2016 with mitomycin.  ?  ?He has since started BCG and has had 2 instillations with Dr. Jacqlyn Larsen. This course was started on 12/04/2016 and a second dose was administered on 12/18/2016 then transferred his care to [facility] and complete the course which finished on 01/22/17. He completed maintenance BCG in 04/2017 and 08/2017.  ?  ?Recent return to OR on 06/07/2019 for biopsy of erythematous patch.  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 ?  ? ? ?Vitals:  ? 03/19/22 1013  ?BP: (!) 147/69  ?Pulse: 69  ? ?NED. A&Ox3.   ?No respiratory distress   ?Abd soft, NT, ND ?Normal phallus with bilateral descended testicles ? ?Cystoscopy Procedure Note ? ?Patient identification was confirmed, informed consent was obtained, and patient was prepped using Betadine solution.  Lidocaine jelly was administered per urethral meatus.   ? ? ?Pre-Procedure: ?- Inspection reveals a normal caliber ureteral meatus. ? ?Procedure: ?The flexible cystoscope was introduced without difficulty ?- No urethral strictures/lesions are present. ?- Enlarged prostate  ?- Normal bladder neck ?- Bilateral ureteral orifices identified ?- Bladder mucosa  reveals no ulcers, tumors, or lesions ?- Stellate scars on left lateral bladder wall ?- No bladder stones ?- mild trabeculation ? ?Retroflexion shows unremarkable  ? ? ?Post-Procedure: ?- Patient tolerated the procedure well ? ? ?Assessment/ Plan: ?1.  History of CIS (carcinoma in situ of bladder) ?- NED  ?- In light of no recurrence will transition cystoscopy to annually.  ?- Urine sent for cytology  ? ?2. Urinary urgency and incontinence  ?- He has had trouble with this in the past  ?- He is not on tropsium anymore he had improvement on this  ?- will start him on a trial of gemtesa he will call us if he has improvement  ? ?Return in about 1 year (around 03/20/2023) for 1 year cysto. ? ?Conley Rolls as a scribe for Hollice Espy, MD.,have documented all relevant documentation on the behalf of Hollice Espy, MD,as directed by  Hollice Espy, MD while in the presence of Hollice Espy, MD. ? ?I have reviewed the above documentation for accuracy and completeness, and I agree with the above.  ? ?Hollice Espy, MD ? ?

## 2022-03-19 ENCOUNTER — Ambulatory Visit: Payer: Medicare Other | Admitting: Urology

## 2022-03-19 ENCOUNTER — Encounter: Payer: Self-pay | Admitting: Urology

## 2022-03-19 VITALS — BP 147/69 | HR 69 | Ht 70.0 in | Wt 186.0 lb

## 2022-03-19 DIAGNOSIS — R32 Unspecified urinary incontinence: Secondary | ICD-10-CM

## 2022-03-19 DIAGNOSIS — Z86008 Personal history of in-situ neoplasm of other site: Secondary | ICD-10-CM

## 2022-03-19 DIAGNOSIS — Z8551 Personal history of malignant neoplasm of bladder: Secondary | ICD-10-CM

## 2022-03-19 DIAGNOSIS — R3915 Urgency of urination: Secondary | ICD-10-CM | POA: Diagnosis not present

## 2022-03-19 LAB — URINALYSIS, COMPLETE
Bilirubin, UA: NEGATIVE
Glucose, UA: NEGATIVE
Ketones, UA: NEGATIVE
Leukocytes,UA: NEGATIVE
Nitrite, UA: NEGATIVE
Protein,UA: NEGATIVE
Specific Gravity, UA: >1.03 — ABNORMAL HIGH (ref 1.005–1.030)
Urobilinogen, Ur: 0.2 mg/dL (ref 0.2–1.0)
pH, UA: 5.5 (ref 5.0–7.5)

## 2022-03-19 LAB — MICROSCOPIC EXAMINATION: Bacteria, UA: NONE SEEN

## 2022-03-19 MED ORDER — GEMTESA 75 MG PO TABS
75.0000 mg | ORAL_TABLET | Freq: Every day | ORAL | 0 refills | Status: DC
Start: 2022-03-19 — End: 2023-03-18

## 2022-03-19 NOTE — Patient Instructions (Addendum)
Start Gemtesa samples, one tablet daily for one month. Call to let the office know if samples help with frequency and leakage. If so will send in a prescription. If not covered by insurance can try Trospium again.  ?

## 2022-03-20 ENCOUNTER — Ambulatory Visit: Payer: Medicare Other

## 2022-03-20 ENCOUNTER — Ambulatory Visit: Payer: Medicare Other | Admitting: Speech Pathology

## 2022-03-20 DIAGNOSIS — R471 Dysarthria and anarthria: Secondary | ICD-10-CM

## 2022-03-20 DIAGNOSIS — R2681 Unsteadiness on feet: Secondary | ICD-10-CM

## 2022-03-20 DIAGNOSIS — M6281 Muscle weakness (generalized): Secondary | ICD-10-CM

## 2022-03-20 DIAGNOSIS — R4701 Aphasia: Secondary | ICD-10-CM | POA: Diagnosis not present

## 2022-03-20 DIAGNOSIS — R269 Unspecified abnormalities of gait and mobility: Secondary | ICD-10-CM

## 2022-03-20 DIAGNOSIS — R278 Other lack of coordination: Secondary | ICD-10-CM

## 2022-03-20 DIAGNOSIS — R262 Difficulty in walking, not elsewhere classified: Secondary | ICD-10-CM

## 2022-03-20 LAB — CYTOLOGY - NON PAP

## 2022-03-20 NOTE — Therapy (Signed)
[place] [facility] ?[place], [place], [address] ?Phone: [phone]   Fax:  [phone] ? ?Physical Therapy Treatment ? ?Patient Details  ?Name: Casey Adkins ?MRN: [id number] ?Date of Birth: 1934-01-02 ?Referring Provider (PT): Dr. Emily Filbert ? ? ?Encounter Date: 03/20/2022 ? ? PT End of Session - 03/20/22 1022   ? ? Visit Number 8   ? Number of Visits 25   ? Date for PT Re-Evaluation 04/25/22   ? Authorization Time Period initial cert 04/26/5637- 07/28/7341   ? Progress Note Due on Visit 10   ? PT Start Time 1017   ? PT Stop Time 1059   ? PT Time Calculation (min) 42 min   ? Equipment Utilized During Treatment Gait belt   ? Activity Tolerance Patient tolerated treatment well   ? Behavior During Therapy [facility] for tasks assessed/performed   ? ?  ?  ? ?  ? ? ?Past Medical History:  ?Diagnosis Date  ? Allergic rhinitis   ? Arthritis   ? Cancer [facility])   ? Bladder Cancer  ? Cancer [facility])   ? Prostate Cancer  ? Diabetes mellitus without complication ([place])   ? Diverticulosis   ? Dysphagia   ? GERD (gastroesophageal reflux disease)   ? History of kidney stones   ? h/o  ? Hypertension   ? Stroke [facility]) 2006  ? possible light stroke-no deficits  ? ? ?Past Surgical History:  ?Procedure Laterality Date  ? CYSTOSCOPY W/ RETROGRADES Bilateral 06/07/2019  ? Procedure: CYSTOSCOPY WITH RETROGRADE PYELOGRAM;  Surgeon: Hollice Espy, MD;  Location: [facility] ORS;  Service: Urology;  Laterality: Bilateral;  ? CYSTOSCOPY WITH BIOPSY N/A 06/07/2019  ? Procedure: CYSTOSCOPY WITH Bladder BIOPSY;  Surgeon: Hollice Espy, MD;  Location: [facility] ORS;  Service: Urology;  Laterality: N/A;  ? ESOPHAGOGASTRODUODENOSCOPY N/A 04/21/2015  ? Procedure: ESOPHAGOGASTRODUODENOSCOPY (EGD);  Surgeon: Hulen Luster, MD;  Location: [facility] ENDOSCOPY;  Service: Gastroenterology;  Laterality: N/A;  ? LITHOTRIPSY    ? SINUS SURGERY WITH INSTATRAK  2008  ? STAPEDES SURGERY Left age 18  ? 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 TONSILLECTOMY    ? ? ?There were no vitals filed for this visit. ? ? Subjective Assessment - 03/20/22 1021   ? ? Subjective Patient reports no pain from fall last week and states doing okay but noticed he is having more difficulty turning to left.   ? Patient is accompained by: Family member   ? Pertinent History Casey Adkins is an 88 y.o. male referred by his PCP Dr. Emily Filbert for weakness. Pt was admitted on 10/10/21- 10/11/21 for CVA. 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 Unable to obtain MRI due to ear implant. Hx of esophageal dysphagia requiring dilation in the past; reports 1 year hx of worsening dysphagia prior to CVA.   ? How long can you sit comfortably? No issues   ? How long can you stand comfortably? Not limited as long as holding onto something   ? How long can you walk comfortably? < 15 min   ? Patient Stated Goals Walk without losing my balance and no falls   ? Currently in Pain? No/denies   ? ?  ?  ? ?  ? ? ? ? ? ?INTERVENTIONS:  ? ?Neuromuscular re-ed:  ? ?360 turning inside of hoolahoop positioned on floor:  ?  5.8 sec to right and 10.67 sec to left initially. Instructed in technique involving concentrating on taking longer step with right- Performed 5 trials and ended with decreased time going to left down to 7.10 sec without UE Support.  ? ?Proprioception activity- placing foot on appropriate cone (x 3- red, yellow, green) - Author would call out color and patient performed with left LE several times- then Right with increased difficulty with Right LE.  ? ? ?Ladder drill  activity- Forward/backward/side stepping- length of // bars x 10 going forward/backward and then 10 sidestep- Increased difficulty with retrogait and unable to increase step length well - did mildly improve with mod VC's ? ?Forward/backward stepping over 1/2 foam x 12 reps- Min UE support- Difficulty with  retrostep ?Side stepping over 1/2 foam x 12 - VC for wide step ? ?Heel to toe sequencing activity- toe off from floor- swing into heel strike with toe box touching 1/2 foam. (For improved foot clearance with swing phase of gait). Patient with difficulty initially achieving consistent heel strike onto 1/2 foam but did improve with practice.  ? ? ? ? ? ? ? ? ? ? ? ? ? ? ? ? ? ? ? ? PT Education - 03/20/22 1022   ? ? Education Details Exercise technique   ? Person(s) Educated Patient   ? Methods Explanation;Demonstration;Tactile cues;Verbal cues   ? Comprehension Verbalized understanding;Returned demonstration;Verbal cues required;Tactile cues required;Need further instruction   ? ?  ?  ? ?  ? ? ? PT Short Term Goals - 02/01/22 1215   ? ?  ? PT SHORT TERM GOAL #1  ? Title Pt will be independent with initial HEP in order to improve strength and balance in order to decrease fall risk and improve function at home and work.   ? Baseline Eval= No formal HEP in place   ? Time 6   ? Period Weeks   ? Status New   ? Target Date 03/14/22   ? ?  ?  ? ?  ? ? ? ? PT Long Term Goals - 02/01/22 1215   ? ?  ? PT LONG TERM GOAL #1  ? Title Pt will be independent with final HEP in order to improve strength and balance in order to decrease fall risk and improve function at home and work.   ? Baseline Eval= No formal HEP in place   ? Time 12   ? Period Weeks   ? Status New   ? Target Date 04/25/22   ?  ? PT LONG TERM GOAL #2  ? Title Pt will improve FOTO to target score of to display perceived improvements in ability to complete ADL's.   ? Baseline Eval= 60   ? Time 12   ? Period Weeks   ? Status New   ? Target Date 04/25/22   ?  ? PT LONG TERM GOAL #3  ? Title Pt will improve BERG by at least 3 points in order to demonstrate clinically significant improvement in balance.   ? Baseline Eval= 36/56   ? Time 12   ? Period Weeks   ? Status New   ? Target Date 04/25/22   ?  ? PT LONG TERM GOAL #4  ? Title Pt will decrease 5TSTS by at least 3  seconds in order to demonstrate clinically significant improvement in LE strength.   ? Baseline Eval= 23.3 sec with BUE Support   ? Time 12   ? Period Weeks   ?  Status New   ? Target Date 04/25/22   ?  ? PT LONG TERM GOAL #5  ? Title Pt will decrease TUG to below 20 seconds/decrease in order to demonstrate decreased fall risk.   ? Baseline Eval= 31.44 with use of RW   ? Time 12   ? Period Weeks   ? Status New   ? Target Date 04/25/22   ?  ? Additional Long Term Goals  ? Additional Long Term Goals Yes   ?  ? PT LONG TERM GOAL #6  ? Title Pt will increase 10MWT by at least 0.13 m/s in order to demonstrate clinically significant improvement in community ambulation.   ? Baseline Eval= 0.62 m/s using RW   ? Time 12   ? Period Weeks   ? Status New   ? Target Date 04/25/22   ? ?  ?  ? ?  ? ? ? ? ? ? ? ? Plan - 03/20/22 1115   ? ? Clinical Impression Statement Patient presented with good motivation for today's session. He demonstrated difficulty with turning to left as he was limited by right LE muscle weakness. Treatment focused on slow down movement and concentrating on taking larger steps with right LE- Able to decrease time with 360 deg turning with VC's. He was also struggling with retrogait- Difficulty taking reciprocal back step but did imrove some with practice. He also presented with mild difficulty with right LE proprioception but again improved with practice today. Pt will continue to benefit from skilled Pt interventios to ipmorve his LE balance, stability and reduce risk of falls.   ? Personal Factors and Comorbidities Age;Comorbidity 3+   ? Comorbidities Diabetes, CVA, HTN   ? Examination-Activity Limitations Caring for Others;Lift;Squat;Stairs;Transfers   ? Examination-Participation Restrictions Cleaning;Community Activity;[place] Work   ? Stability/Clinical Decision Making Evolving/Moderate complexity   ? Rehab Potential Good   ? PT Frequency 2x / week   ? PT Duration 12 weeks   ? PT Treatment/Interventions  ADLs/Self Care Home Management;Canalith Repostioning;Cryotherapy;Electrical Stimulation;Moist Heat;Traction;Ultrasound;DME Instruction;Gait training;Stair training;Functional mobility training;Therapeutic Marya Fossa

## 2022-03-20 NOTE — Therapy (Signed)
[place] [facility] ?[place], [place], [address] ?Phone: [phone]   Fax:  [phone] ? ?Speech Language Pathology Treatment ? ?Patient Details  ?Name: Casey Adkins ?MRN: [id number] ?Date of Birth: 05-06-1934 ?Referring Provider (SLP): Casey Aus, MD ? ? ?Encounter Date: 03/20/2022 ? ? End of Session - 03/20/22 1058   ? ? Visit Number 26   ? Number of Visits 37   ? Date for SLP Re-Evaluation 04/28/22   ? SLP Start Time [phone]   ? SLP Stop Time  1000   ? SLP Time Calculation (min) 58 min   ? Activity Tolerance Patient tolerated treatment well   ? ?  ?  ? ?  ? ? ?Past Medical History:  ?Diagnosis Date  ? Allergic rhinitis   ? Arthritis   ? Cancer [facility])   ? Bladder Cancer  ? Cancer [facility])   ? Prostate Cancer  ? Diabetes mellitus without complication ([place])   ? Diverticulosis   ? Dysphagia   ? GERD (gastroesophageal reflux disease)   ? History of kidney stones   ? h/o  ? Hypertension   ? Stroke [facility]) 2006  ? possible light stroke-no deficits  ? ? ?Past Surgical History:  ?Procedure Laterality Date  ? CYSTOSCOPY W/ RETROGRADES Bilateral 06/07/2019  ? Procedure: CYSTOSCOPY WITH RETROGRADE PYELOGRAM;  Surgeon: Casey Espy, MD;  Location: [facility] ORS;  Service: Urology;  Laterality: Bilateral;  ? CYSTOSCOPY WITH BIOPSY N/A 06/07/2019  ? Procedure: CYSTOSCOPY WITH Bladder BIOPSY;  Surgeon: Casey Espy, MD;  Location: [facility] ORS;  Service: Urology;  Laterality: N/A;  ? ESOPHAGOGASTRODUODENOSCOPY N/A 04/21/2015  ? Procedure: ESOPHAGOGASTRODUODENOSCOPY (EGD);  Surgeon: Casey Luster, MD;  Location: [facility] ENDOSCOPY;  Service: Gastroenterology;  Laterality: N/A;  ? LITHOTRIPSY    ? SINUS SURGERY WITH INSTATRAK  2008  ? STAPEDES SURGERY Left age 18  ? 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 TONSILLECTOMY    ? ? ?There were no vitals filed for this visit. ? ? Subjective Assessment - 03/20/22 1056   ? ? Subjective "I don't talk much outside of  here"   ? Patient is accompained by: Family member   Spouse present for session  ? Currently in Pain? No/denies   ? Pain Score 0-No pain   ? ?  ?  ? ?  ? ? ? ? ? ? ? ? ADULT SLP TREATMENT - 03/20/22 1215   ? ?  ? General Information  ? Behavior/Cognition Alert;Cooperative;Pleasant mood   ? HPI Casey Adkins is an 88 y.o. male referred for MBS by his PCP Dr. Emily Adkins. Pt admitted 10/10/21- 10/11/21 for CVA. 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 Unable to obtain MRI due to ear implant. Hx of esophageal dysphagia requiring dilation in the past; reports 1 year hx of worsening dysphagia prior to CVA. Neurology noted right upper motor neuron pattern weakness, suspected small vessel lacunar infarction. 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 Wife assists with history given pt's dysarthria, aphasia.   ?  ? Treatment Provided  ? Treatment provided Cognitive-Linquistic   ?  ? Cognitive-Linquistic Treatment  ? Treatment focused on Dysarthria;Aphasia;Patient/family/caregiver education   ? Skilled Treatment Pt with persistent cough and throat clearing throughout session today, reports having allergies. At beginning of session, pt expressed not talking much at home. Encouraged pt to spend  at least 10 minutes a day  ?talking with someone (ie spouse, family member). Pt reports he is open to trying this. Targeted dysarthria with paragraph level reading of pt's interest (the decades; 1940's). Pt read paragraphs with ~60% intelligibility , improved to ~80% with mod cues to use dysarthria strategies. Encouraged pt to take breaks to drink water and to reduce frustration with task. During a break, pt placed a piece of candy in his mouth (peppermint) and continued talking. With use of a visual aid, pt educated on swallow precautions. Recommended pt not to eat and talk at the same time to reduce risk of  aspiration. Noted pt talking with candy in his mouth when leaving the session. Conversational level intelligibility was judged to be ~75%.   ?  ? Assessment / Recommendations / Plan  ? Plan Continue with current plan of care   ?  ? Progression Toward Goals  ? Progression toward goals Progressing toward goals   ? ?  ?  ? ?  ? ? ? SLP Education - 03/20/22 1057   ? ? Education Details Swallow Precautions, SLOP strategies   ? Person(s) Educated Patient   ? Methods Explanation   ? Comprehension Verbalized understanding   ? ?  ?  ? ?  ? ? ? SLP Short Term Goals - 02/20/22 1726   ? ?  ? SLP SHORT TERM GOAL #1  ? Title Pt will use aphasia compensations during 5-8 minutes conversation with occasional min cues.   ? Time 10   ? Period --   sessions  ? Status Achieved   ?  ? SLP SHORT TERM GOAL #2  ? Title Pt will demonstrate dysarthria HEP with rare min cues.   ? Time 10   ? Period --   sessions  ? Status Achieved   ?  ? SLP SHORT TERM GOAL #3  ? Title Patient will use compensations for dysarthria in sentence responses for >95% intelligibility.   ? Time 10   ? Period --   sessions  ? Status Achieved   ?  ? SLP SHORT TERM GOAL #4  ? Title Pt will complete further assessment of cognitive-communication PRN.   ? Time 10   ? Period --   sessions  ? Status Achieved   ?  ? SLP SHORT TERM GOAL #5  ? Title Patient will use compensations for dysarthria in sentence-level reading for >95% intelligibility.   ? Time 10   ? Period --   sessions  ? Status Partially Met   revised after worsening symptoms, possible new CVA on 01/21/22  ?  ? SLP SHORT TERM GOAL #6  ? Title Patient will demonstrate alternating attention over 5 minutes between 2 simple cognitive-linguistic task with 90% accuracy.   ? Time 10   ? Period --   sessions  ? Status Deferred   Deferred due to patient's preference for focus on intelligibility  ? ?  ?  ? ?  ? ? ? SLP Long Term Goals - 02/20/22 1730   ? ?  ? SLP LONG TERM GOAL #1  ? Title Pt will use aphasia compensations  effectively during 15 minutes mod complex conversation with modified independence.   ? Time 12   ? Period Weeks   ? Status Revised   revised after worsening symptoms, possible new CVA on 01/21/22  ?  ? SLP LONG TERM GOAL #2  ? Title Pt will use aphasia compensations with modified independence to resolve communication  breakdown at least 3 occasions outside of ST, per pt/spouse report.   ? Time 12   ? Period Weeks   ? Status On-going   ?  ? SLP LONG TERM GOAL #3  ? Title Patient will use compensations for dysarthria in 10 minute conversation for >80% intelligibility with no more than 2 repetitions necessary.   ? Time 12   ? Period Weeks   ? Status Revised   Revised due to progress/pt's demands  ?  ? SLP LONG TERM GOAL #4  ? Title Patient will demonstrate swallowing precautions independently x3 sessions with no overt s/sx aspiration.   ? Time 12   ? Period Weeks   ? Status On-going   ? ?  ?  ? ?  ? ? ? Plan - 03/20/22 1236   ? ? Clinical Impression Statement Patient continues with moderate dysarthria, mild aphasia, and cognitive communication deficits. Pt continues to be receptive to using strategies for dysarthria; noted pt using strategies during side conversation with spouse. Pt demonstrated reduced safety awareness with swallow precautions today, education provided on swallowing safety. Pt would benefit from continued training of dysarthria strategies to improve intelligibility with communication partners. I recommend continued skilled ST to improve intelligibility, verbal expression and swallowing safety necessary for increased communicatory success and independence in the home and community environments.   ? Speech Therapy Frequency 2x / week   will decrease to 1x per week beginning in May  ? Duration 12 weeks   ? Treatment/Interventions Aspiration precaution training;Environmental controls;Language facilitation;Cueing hierarchy;Oral motor exercises;SLP instruction and feedback;Pharyngeal strengthening  exercises;Compensatory techniques;Cognitive reorganization;Functional tasks;Compensatory strategies;Diet toleration management by SLP;Internal/external aids;Multimodal communcation approach;Patient/family education   ? Pot

## 2022-03-22 ENCOUNTER — Ambulatory Visit: Payer: Medicare Other | Admitting: Physical Therapy

## 2022-03-25 ENCOUNTER — Encounter: Payer: Medicare Other | Admitting: Speech Pathology

## 2022-03-27 ENCOUNTER — Ambulatory Visit: Payer: Medicare Other

## 2022-03-27 ENCOUNTER — Ambulatory Visit: Payer: Medicare Other | Attending: Internal Medicine | Admitting: Speech Pathology

## 2022-03-27 DIAGNOSIS — M6281 Muscle weakness (generalized): Secondary | ICD-10-CM

## 2022-03-27 DIAGNOSIS — R2681 Unsteadiness on feet: Secondary | ICD-10-CM | POA: Insufficient documentation

## 2022-03-27 DIAGNOSIS — R278 Other lack of coordination: Secondary | ICD-10-CM | POA: Insufficient documentation

## 2022-03-27 DIAGNOSIS — R269 Unspecified abnormalities of gait and mobility: Secondary | ICD-10-CM | POA: Diagnosis present

## 2022-03-27 DIAGNOSIS — R471 Dysarthria and anarthria: Secondary | ICD-10-CM | POA: Insufficient documentation

## 2022-03-27 DIAGNOSIS — R4701 Aphasia: Secondary | ICD-10-CM | POA: Insufficient documentation

## 2022-03-27 DIAGNOSIS — R262 Difficulty in walking, not elsewhere classified: Secondary | ICD-10-CM | POA: Diagnosis present

## 2022-03-27 NOTE — Therapy (Signed)
[place] [facility] ?[place], [place], [address] ?Phone: [phone]   Fax:  [phone] ? ?Speech Language Pathology Treatment ? ?Patient Details  ?Name: Casey Adkins ?MRN: [id number] ?Date of Birth: [birth year] ?Referring Provider (SLP): Rusty Aus, MD ? ? ?Encounter Date: 03/27/2022 ? ? End of Session - 03/27/22 1240   ? ? Visit Number 27   ? Number of Visits 37   ? Date for SLP Re-Evaluation 04/28/22   ? SLP Start Time 0900   ? SLP Stop Time  1000   ? SLP Time Calculation (min) 60 min   ? Activity Tolerance Patient tolerated treatment well   ? ?  ?  ? ?  ? ? ?Past Medical History:  ?Diagnosis Date  ? Allergic rhinitis   ? Arthritis   ? Cancer [facility])   ? Bladder Cancer  ? Cancer [facility])   ? Prostate Cancer  ? Diabetes mellitus without complication ([place])   ? Diverticulosis   ? Dysphagia   ? GERD (gastroesophageal reflux disease)   ? History of kidney stones   ? h/o  ? Hypertension   ? Stroke [facility]) 2006  ? possible light stroke-no deficits  ? ? ?Past Surgical History:  ?Procedure Laterality Date  ? CYSTOSCOPY W/ RETROGRADES Bilateral 06/07/2019  ? Procedure: CYSTOSCOPY WITH RETROGRADE PYELOGRAM;  Surgeon: Hollice Espy, MD;  Location: [facility] ORS;  Service: Urology;  Laterality: Bilateral;  ? CYSTOSCOPY WITH BIOPSY N/A 06/07/2019  ? Procedure: CYSTOSCOPY WITH Bladder BIOPSY;  Surgeon: Hollice Espy, MD;  Location: [facility] ORS;  Service: Urology;  Laterality: N/A;  ? ESOPHAGOGASTRODUODENOSCOPY N/A 04/21/2015  ? Procedure: ESOPHAGOGASTRODUODENOSCOPY (EGD);  Surgeon: Hulen Luster, MD;  Location: [facility] ENDOSCOPY;  Service: Gastroenterology;  Laterality: N/A;  ? LITHOTRIPSY    ? SINUS SURGERY WITH INSTATRAK  2008  ? STAPEDES SURGERY Left age 29  ? 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 TONSILLECTOMY    ? ? ?There were no vitals filed for this visit. ? ? Subjective Assessment - 03/27/22 1236   ? ? Subjective Pt instantly got louder once  sitting down in ST tx chair   ? Currently in Pain? No/denies   ? ?  ?  ? ?  ? ? ? ? ? ? ? ? ADULT SLP TREATMENT - 03/27/22 1236   ? ?  ? General Information  ? Behavior/Cognition Alert;Cooperative;Pleasant mood   ? HPI Casey Adkins is an 88 y.o. male referred for MBS by his PCP Dr. Emily Filbert. Pt admitted 10/10/21- 10/11/21 for CVA. 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 Unable to obtain MRI due to ear implant. Hx of esophageal dysphagia requiring dilation in the past; reports 1 year hx of worsening dysphagia prior to CVA. Neurology noted right upper motor neuron pattern weakness, suspected small vessel lacunar infarction. 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 Wife assists with history given pt's dysarthria, aphasia.   ?  ? Treatment Provided  ? Treatment provided Cognitive-Linquistic   ?  ? Cognitive-Linquistic Treatment  ? Treatment focused on Dysarthria;Aphasia;Patient/family/caregiver education   ? Skilled Treatment Focused session on pt conversation goals (use of dysarthria and aphasia compensations). Pt initiated conversation using louder, slowed speech, however required occasional cues (verbal, fading to gestural/non-verbal) for louder, slower speech. Pt initiated repeats of his last statement when he received feedback that he wasn't  speaking clearly enough. Hesitations/anomia occured x2, with extended time and encouragement, pt relayed his message instead of abandoning topic.  Conversational intelligibility 80%.   ?  ? Assessment / Recommendations / Plan  ? Plan Continue with current plan of care   ?  ? Progression Toward Goals  ? Progression toward goals Progressing toward goals   ? ?  ?  ? ?  ? ? ? SLP Education - 03/27/22 1239   ? ? Education Details how stroke can affect loudness   ? Person(s) Educated Patient   ? Methods Explanation   ? Comprehension Verbalized  understanding   ? ?  ?  ? ?  ? ? ? SLP Short Term Goals - 02/20/22 1726   ? ?  ? SLP SHORT TERM GOAL #1  ? Title Pt will use aphasia compensations during 5-8 minutes conversation with occasional min cues.   ? Time 10   ? Period --   sessions  ? Status Achieved   ?  ? SLP SHORT TERM GOAL #2  ? Title Pt will demonstrate dysarthria HEP with rare min cues.   ? Time 10   ? Period --   sessions  ? Status Achieved   ?  ? SLP SHORT TERM GOAL #3  ? Title Patient will use compensations for dysarthria in sentence responses for >95% intelligibility.   ? Time 10   ? Period --   sessions  ? Status Achieved   ?  ? SLP SHORT TERM GOAL #4  ? Title Pt will complete further assessment of cognitive-communication PRN.   ? Time 10   ? Period --   sessions  ? Status Achieved   ?  ? SLP SHORT TERM GOAL #5  ? Title Patient will use compensations for dysarthria in sentence-level reading for >95% intelligibility.   ? Time 10   ? Period --   sessions  ? Status Partially Met   revised after worsening symptoms, possible new CVA on 01/21/22  ?  ? SLP SHORT TERM GOAL #6  ? Title Patient will demonstrate alternating attention over 5 minutes between 2 simple cognitive-linguistic task with 90% accuracy.   ? Time 10   ? Period --   sessions  ? Status Deferred   Deferred due to patient's preference for focus on intelligibility  ? ?  ?  ? ?  ? ? ? SLP Long Term Goals - 02/20/22 1730   ? ?  ? SLP LONG TERM GOAL #1  ? Title Pt will use aphasia compensations effectively during 15 minutes mod complex conversation with modified independence.   ? Time 12   ? Period Weeks   ? Status Revised   revised after worsening symptoms, possible new CVA on 01/21/22  ?  ? SLP LONG TERM GOAL #2  ? Title Pt will use aphasia compensations with modified independence to resolve communication breakdown at least 3 occasions outside of ST, per pt/spouse report.   ? Time 12   ? Period Weeks   ? Status On-going   ?  ? SLP LONG TERM GOAL #3  ? Title Patient will use compensations for  dysarthria in 10 minute conversation for >80% intelligibility with no more than 2 repetitions necessary.   ? Time 12   ? Period Weeks   ? Status Revised   Revised due to progress/pt's demands  ?  ? SLP LONG TERM GOAL #4  ? Title Patient will demonstrate swallowing precautions independently x3 sessions with no overt  s/sx aspiration.   ? Time 12   ? Period Weeks   ? Status On-going   ? ?  ?  ? ?  ? ? ? Plan - 03/27/22 1240   ? ? Clinical Impression Statement Patient continues with moderate dysarthria, mild aphasia, and cognitive communication deficits. Pt continues to be receptive to using strategies for dysarthria; noted pt using strategies during side conversations with spouse. Continue skilled ST with focus on conversational carryover of strategies to improve intelligibility with communication partners . I recommend continued skilled ST to improve intelligibility, verbal expression and swallowing safety necessary for increased communicatory success and independence in the home and community environments.   ? Speech Therapy Frequency 1x /week   will decrease to 1x per week beginning in May  ? Duration 12 weeks   ? Treatment/Interventions Aspiration precaution training;Environmental controls;Language facilitation;Cueing hierarchy;Oral motor exercises;SLP instruction and feedback;Pharyngeal strengthening exercises;Compensatory techniques;Cognitive reorganization;Functional tasks;Compensatory strategies;Diet toleration management by SLP;Internal/external aids;Multimodal communcation approach;Patient/family education   ? Potential to Achieve Goals Good   ? Consulted and Agree with Plan of Care Family member/caregiver;Patient   ? ?  ?  ? ?  ? ? ?Patient will benefit from skilled therapeutic intervention in order to improve the following deficits and impairments:   ?Dysarthria and anarthria ? ?Aphasia ? ? ? ?Problem List ?Patient Active Problem List  ? Diagnosis Date Noted  ? HLD (hyperlipidemia) 01/21/2022  ? Essential  tremor 01/21/2022  ? Stroke-like episode   ? Overweight (BMI 25.0-29.9) 10/11/2021  ? Hypertriglyceridemia 10/11/2021  ? Stroke-like symptom 10/10/2021  ? Diabetes mellitus without complication ([place]) 27/25/3664  ? HTN

## 2022-03-27 NOTE — Therapy (Signed)
[place] ?[facility] ?[place], [place], [address] ?Phone: [phone]   Fax:  [phone] ? ?Physical Therapy Treatment ? ?Patient Details  ?Name: Casey Adkins ?MRN: [id number] ?Date of Birth: September 15, 1934 ?Referring Provider (PT): Dr. Emily Filbert ? ? ?Encounter Date: 03/27/2022 ? ? PT End of Session - 03/27/22 1043   ? ? Visit Number 9   ? Number of Visits 25   ? Date for PT Re-Evaluation 04/25/22   ? Authorization Time Period initial cert 03/28/2705- 12/28/7626   ? Progress Note Due on Visit 10   ? PT Start Time 1100   ? PT Stop Time 1144   ? PT Time Calculation (min) 44 min   ? Equipment Utilized During Treatment Gait belt   ? Activity Tolerance Patient tolerated treatment well   ? Behavior During Therapy [facility] for tasks assessed/performed   ? ?  ?  ? ?  ? ? ?Past Medical History:  ?Diagnosis Date  ? Allergic rhinitis   ? Arthritis   ? Cancer [facility])   ? Bladder Cancer  ? Cancer [facility])   ? Prostate Cancer  ? Diabetes mellitus without complication ([place])   ? Diverticulosis   ? Dysphagia   ? GERD (gastroesophageal reflux disease)   ? History of kidney stones   ? h/o  ? Hypertension   ? Stroke [facility]) 2006  ? possible light stroke-no deficits  ? ? ?Past Surgical History:  ?Procedure Laterality Date  ? CYSTOSCOPY W/ RETROGRADES Bilateral 06/07/2019  ? Procedure: CYSTOSCOPY WITH RETROGRADE PYELOGRAM;  Surgeon: Hollice Espy, MD;  Location: [facility] ORS;  Service: Urology;  Laterality: Bilateral;  ? CYSTOSCOPY WITH BIOPSY N/A 06/07/2019  ? Procedure: CYSTOSCOPY WITH Bladder BIOPSY;  Surgeon: Hollice Espy, MD;  Location: [facility] ORS;  Service: Urology;  Laterality: N/A;  ? ESOPHAGOGASTRODUODENOSCOPY N/A 04/21/2015  ? Procedure: ESOPHAGOGASTRODUODENOSCOPY (EGD);  Surgeon: Hulen Luster, MD;  Location: [facility] ENDOSCOPY;  Service: Gastroenterology;  Laterality: N/A;  ? LITHOTRIPSY    ? SINUS SURGERY WITH INSTATRAK  2008  ? STAPEDES SURGERY Left age 28  ? 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 TONSILLECTOMY    ? ? ?There were no vitals filed for this visit. ? ? Subjective Assessment - 03/27/22 1152   ? ? Subjective Patient reports no pain and no falls. Is being discharged from OT.   ? Patient is accompained by: Family member   ? Pertinent History Casey Adkins is an 88 y.o. male referred by his PCP Dr. Emily Filbert for weakness. Pt was admitted on 10/10/21- 10/11/21 for CVA. 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 Unable to obtain MRI due to ear implant. Hx of esophageal dysphagia requiring dilation in the past; reports 1 year hx of worsening dysphagia prior to CVA.   ? How long can you sit comfortably? No issues   ? How long can you stand comfortably? Not limited as long as holding onto something   ? How long can you walk comfortably? < 15 min   ? Patient Stated Goals Walk without losing my balance and no falls   ? Currently in Pain? No/denies   ? ?  ?  ? ?  ? ? ? ?TREATMENT:  ? ?speed ladder: ?-one foot each square 4x length ?-lateral stepping each square 4x length; very challenging to the L  ? ?Stand in middle of  cone circle:PT call out color of cone walk to cone and turn around cone to return to center x 3 minutes ? ? ?Next to support surface: ?-lateral step over orange hurdle 12x each side with BUE support ? ?Standing with CGA next to support surface:  ?Airex pad: static stand 30 seconds x 2 trials, noticeable trembling of ankles/LE's with fatigue and challenge to maintain stability ?Airex pad: horizontal head turns 30 seconds scanning room 10x ; cueing for arc of motion  ?Airex pad: vertical head turns 30 seconds, cueing for arc of motion, noticeable sway with upward gaze increasing demand on ankle righting reaction musculature ?Airex pad: one foot on 6" step one foot on airex pad, hold position for 30 seconds, switch legs, 2x each LE; ? ?squat with UE support 10x with chair behind  patient.  ? ?airex pad 6" step airex pad lateral stepping 10x each side BU Esupport ? ?GTB hamstring curl 15x each LE seated ?Seated GTB across bilateral ankles alternating LAQ 12x each LE ? ?Pt educated throughout session about proper posture and technique with exercises. Improved exercise technique, movement at target joints, use of target muscles after min to mod verbal, visual, tactile cues. ? ? ?Patient tolerates progressive stabilization and strengthening this session. He does have limited spatial awareness laterally and limited weight acceptance onto LLE this session indicating areas of continued focus. Instability noted getting onto/off of airex pad is noted and requires close CGA. Pt will continue to benefit from skilled Pt interventions to improve his LE balance, stability and reduce risk of falls. ? ? ? ? ? ? ? ? ? ? ? ? ? ? ? ? ? ? PT Education - 03/27/22 1043   ? ? Education Details exercise technique, body mechanics   ? Person(s) Educated Patient   ? Methods Explanation;Demonstration;Tactile cues;Verbal cues   ? Comprehension Verbalized understanding;Returned demonstration;Verbal cues required;Tactile cues required   ? ?  ?  ? ?  ? ? ? PT Short Term Goals - 02/01/22 1215   ? ?  ? PT SHORT TERM GOAL #1  ? Title Pt will be independent with initial HEP in order to improve strength and balance in order to decrease fall risk and improve function at home and work.   ? Baseline Eval= No formal HEP in place   ? Time 6   ? Period Weeks   ? Status New   ? Target Date 03/14/22   ? ?  ?  ? ?  ? ? ? ? PT Long Term Goals - 02/01/22 1215   ? ?  ? PT LONG TERM GOAL #1  ? Title Pt will be independent with final HEP in order to improve strength and balance in order to decrease fall risk and improve function at home and work.   ? Baseline Eval= No formal HEP in place   ? Time 12   ? Period Weeks   ? Status New   ? Target Date 04/25/22   ?  ? PT LONG TERM GOAL #2  ? Title Pt will improve FOTO to target score of to display  perceived improvements in ability to complete ADL's.   ? Baseline Eval= 60   ? Time 12   ? Period Weeks   ? Status New   ? Target Date 04/25/22   ?  ? PT LONG TERM GOAL #3  ? Title Pt will improve BERG by at least 3 points in order to demonstrate clinically significant improvement in balance.   ?  Baseline Eval= 36/56   ? Time 12   ? Period Weeks   ? Status New   ? Target Date 04/25/22   ?  ? PT LONG TERM GOAL #4  ? Title Pt will decrease 5TSTS by at least 3 seconds in order to demonstrate clinically significant improvement in LE strength.   ? Baseline Eval= 23.3 sec with BUE Support   ? Time 12   ? Period Weeks   ? Status New   ? Target Date 04/25/22   ?  ? PT LONG TERM GOAL #5  ? Title Pt will decrease TUG to below 20 seconds/decrease in order to demonstrate decreased fall risk.   ? Baseline Eval= 31.44 with use of RW   ? Time 12   ? Period Weeks   ? Status New   ? Target Date 04/25/22   ?  ? Additional Long Term Goals  ? Additional Long Term Goals Yes   ?  ? PT LONG TERM GOAL #6  ? Title Pt will increase 10MWT by at least 0.13 m/s in order to demonstrate clinically significant improvement in community ambulation.   ? Baseline Eval= 0.62 m/s using RW   ? Time 12   ? Period Weeks   ? Status New   ? Target Date 04/25/22   ? ?  ?  ? ?  ? ? ? ? ? ? ? ? Plan - 03/27/22 1154   ? ? Clinical Impression Statement Patient tolerates progressive stabilization and strengthening this session. He does have limited spatial awareness laterally and limited weight acceptance onto LLE this session indicating areas of continued focus. Instability noted getting onto/off of airex pad is noted and requires close CGA. Pt will continue to benefit from skilled Pt interventions to improve his LE balance, stability and reduce risk of falls.   ? Personal Factors and Comorbidities Age;Comorbidity 3+   ? Comorbidities Diabetes, CVA, HTN   ? Examination-Activity Limitations Caring for Others;Lift;Squat;Stairs;Transfers   ?  Examination-Participation Restrictions Cleaning;Community Activity;[place] Work   ? Stability/Clinical Decision Making Evolving/Moderate complexity   ? Rehab Potential Good   ? PT Frequency 2x / week   ? PT Duration 12 weeks   ? PT T

## 2022-03-28 NOTE — Therapy (Signed)
[place] ?[place], [place], [address] ?Phone: [phone]   Fax:  [phone] ? ?Occupational Therapy Discharge ? ?Patient Details  ?Name: Casey Adkins ?MRN: [id number] ?Date of Birth: [birth year] ?Referring Provider (OT): Dr. Emily Filbert, PCP ? ? ?Encounter Date: 03/27/2022 ? ? OT End of Session - 03/28/22 0935   ? ? Visit Number 9   ? Number of Visits 12   ? Date for OT Re-Evaluation 03/27/22   ? Authorization Type Progress reporting period starting 01/31/2022   ? OT Start Time 1020   ? OT Stop Time 1100   ? OT Time Calculation (min) 40 min   ? Activity Tolerance Patient tolerated treatment well   ? Behavior During Therapy [facility] for tasks assessed/performed   ? ?  ?  ? ?  ? ? ?Past Medical History:  ?Diagnosis Date  ? Allergic rhinitis   ? Arthritis   ? Cancer [facility])   ? Bladder Cancer  ? Cancer [facility])   ? Prostate Cancer  ? Diabetes mellitus without complication ([place])   ? Diverticulosis   ? Dysphagia   ? GERD (gastroesophageal reflux disease)   ? History of kidney stones   ? h/o  ? Hypertension   ? Stroke [facility]) 2006  ? possible light stroke-no deficits  ? ? ?Past Surgical History:  ?Procedure Laterality Date  ? CYSTOSCOPY W/ RETROGRADES Bilateral 06/07/2019  ? Procedure: CYSTOSCOPY WITH RETROGRADE PYELOGRAM;  Surgeon: Hollice Espy, MD;  Location: [facility] ORS;  Service: Urology;  Laterality: Bilateral;  ? CYSTOSCOPY WITH BIOPSY N/A 06/07/2019  ? Procedure: CYSTOSCOPY WITH Bladder BIOPSY;  Surgeon: Hollice Espy, MD;  Location: [facility] ORS;  Service: Urology;  Laterality: N/A;  ? ESOPHAGOGASTRODUODENOSCOPY N/A 04/21/2015  ? Procedure: ESOPHAGOGASTRODUODENOSCOPY (EGD);  Surgeon: Hulen Luster, MD;  Location: [facility] ENDOSCOPY;  Service: Gastroenterology;  Laterality: N/A;  ? LITHOTRIPSY    ? SINUS SURGERY WITH INSTATRAK  2008  ? STAPEDES SURGERY Left age 17  ? 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 TONSILLECTOMY    ? ? ?There were no  vitals filed for this visit. ? ? Subjective Assessment - 03/27/22 0932   ? ? Subjective  Pt reports they had a late night last night as pt had some gum bleeding while brushing his teeth and it bled for awhile before it stopped.   ? 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  Unable to confirm another stroke via MRI d/t ear implant.   ? Limitations R sided weakness, lack of coordination, impaired balance, slurred speech   ? Patient Stated Goals To get back to bowling.   ? Currently in Pain? No/denies   ? Pain Score 0-No pain   ? Multiple Pain Sites No   ? ?  ?  ? ?  ? ?Occupational Therapy Discharge: ?Therapeutic Activity: ?Objective measures taken, goals reviewed and updated.  Pt participated in dynamic standing activity, rolling 11# ball at a target to simulate bowling.  Pt practiced standing in place to roll and taking a 1 step approach, as well as simulating reaching for ball at thigh height from a ball return.  Pt able to perform all with SBA,  intermittent balance check with RUE to support surface laterally (walker or table beside pt).  Pt will continue to focus on dynamic standing balance with PT sessions as he wishes to d/c OT today.   ? ?Response to Treatment: ?Pt has been seen by OT for 9 sessions to address dynamic standing balance and R FMC.  Pt has made gains with both (see note for details).  No change in FOTO score but individual goals mostly met.  Pt has not yet returned to bowling, but has progressed to being able to roll an 11# ball toward a target with SBA, and pt will continue to address balance with ongoing PT sessions.  R FMC has greatly improved as noted by initial 9 hole peg test score of 1 min 7  sec and improved to 41 sec at d/c on the R.  Pt is now performing toilet transfers with modified indep.  Goals partially met, but pt wishes to d/c OT at this date as pt is satisfied with is achievement toward OT goals and wishes to focus on SLP for speech and PT for remaining balance deficits.  ? ? [facility] OT Assessment - 03/28/22 0001   ? ?  ? ADL  ? Toilet Transfer Modified independent   ? Tub/Shower Transfer Supervision/safety   ?  ? Observation/Other Assessments  ? Focus on Therapeutic Outcomes (FOTO)  60   ?  ? Coordination  ? Gross Motor Movements are Fluid and Coordinated Yes   ? Fine Motor Movements are Fluid and Coordinated No   mild tremor, but otherwise good improvement as noted by improved 9 hole peg test  ? Right 9 Hole Peg Test 41 sec   ? Left 9 Hole Peg Test 43 sec   ?  ? Hand Function  ? Right Hand Grip (lbs) 55   ? Right Hand Lateral Pinch 18 lbs   ? Right Hand 3 Point Pinch 15 lbs   ? Left Hand Grip (lbs) 50   ? Left Hand Lateral Pinch 14 lbs   ? Left 3 point pinch 15 lbs   ? ?  ?  ? ?  ? ? OT Education - 03/27/22 0935   ? ? Education Details review of goals for discharge   ? Person(s) Educated Patient;Spouse   ? Methods Explanation;Verbal cues;Demonstration;Tactile cues   ? Comprehension Verbalized understanding   ? ?  ?  ? ?  ? ? ? OT Short Term Goals - 03/27/22 0937   ? ?  ? OT SHORT TERM GOAL #1  ? Title Pt will perform HEP for increasing hand coordination with min vc.   ? Baseline Eval: initiated HEP at eval, further training needed; 03/27/22 d/c: indep with HEP   ? Time 3   ? Period Weeks   ? Status Achieved   ? Target Date 02/20/22   ? ?  ?  ? ?  ? ? ? ? OT Long Term Goals - 03/27/22 0937   ? ?  ? OT LONG TERM GOAL #1  ? Title Pt will increase FOTO score by 2 or more points to indicate increased functional performance.   ? Baseline Eval: FOTO 60; 03/27/22 d/c: 60   ? Time 6   ? Period Weeks   ? Status Not Met   ? Target Date 03/13/22   ?  ? OT LONG TERM GOAL #2  ? Title Pt will increase R hand  coordination to manage eating utensils with good control.   ? Baseline  Eval: Pt reports being clumsy with utensils; 03/27/22 d/c: spouse reports fair control.  Pt reported liking the weighted utensil but did not wish to obtain at this time.   ? Time 6   ? Period Weeks   ? Status Partially Met   ? Target Date 03/13/22   ?  ? OT LONG TERM GOAL #3  ? Title Pt will increase R hand dexterity for better manipulation of ADL supplies as noted by <60 sec to complete 9 hole peg test.   ? Baseline Eval: R 1 min 7 sec, L 39 sec; 03/27/22: R 41 sec, L 43 sec   ? Time 6   ? Period Weeks   ? Status Achieved   ? Target Date 03/13/22   ?  ? OT LONG TERM GOAL #4  ? Title Pt will perform bathroom transfers with modified indep-indep.   ? Baseline Eval: spouse supervises all functional transfers; pt using RW for support; 03/27/22 d/c: modified indep for toilet transfer, supv for shower transfer as sliding glass door is present on shower and spouse prefers to hold in place for extra caution.   ? Time 6   ? Period Weeks   ? Status Partially Met   ? Target Date 03/13/22   ?  ? OT LONG TERM GOAL #5  ? Title Pt will increase dynamic standing balance to enable pt to roll a light weight (5lbs or less) ball independently to work towards return to bowling.   ? Baseline Eval: unable to participate in bowling (pt's primary leisure activity); 03/27/22 d/c: able to roll an 11 lb ball with SBA (pt will continue to address balance with PT sessions.   ? Time 6   ? Period Weeks   ? Status Partially Met   ? Target Date 03/13/22   ? ?  ?  ? ?  ? ? ? Plan - 03/27/22 0955   ? ? Clinical Impression Statement Pt has been seen by OT for 9 sessions to address dynamic standing balance and R FMC.  Pt has made gains with both (see note for details).  No change in FOTO score but individual goals mostly met.  Pt has not yet returned to bowling, but has progressed to being able to roll an 11# ball toward a target with SBA, and pt will continue to address balance with ongoing  PT sessions.  R FMC has greatly improved as noted by initial 9 hole peg test score of 1 min 7 sec and improved to 41 sec at d/c on the R.  Pt is now performing toilet transfers with modified indep.  Goals

## 2022-03-29 ENCOUNTER — Encounter: Payer: Medicare Other | Admitting: Occupational Therapy

## 2022-03-29 ENCOUNTER — Ambulatory Visit: Payer: Medicare Other | Admitting: Physical Therapy

## 2022-04-01 ENCOUNTER — Encounter: Payer: Medicare Other | Admitting: Speech Pathology

## 2022-04-03 ENCOUNTER — Ambulatory Visit: Payer: Medicare Other | Admitting: Speech Pathology

## 2022-04-03 ENCOUNTER — Ambulatory Visit: Payer: Medicare Other

## 2022-04-03 ENCOUNTER — Encounter: Payer: Medicare Other | Admitting: Occupational Therapy

## 2022-04-03 DIAGNOSIS — R471 Dysarthria and anarthria: Secondary | ICD-10-CM

## 2022-04-03 DIAGNOSIS — R278 Other lack of coordination: Secondary | ICD-10-CM

## 2022-04-03 DIAGNOSIS — R262 Difficulty in walking, not elsewhere classified: Secondary | ICD-10-CM

## 2022-04-03 DIAGNOSIS — R2681 Unsteadiness on feet: Secondary | ICD-10-CM

## 2022-04-03 DIAGNOSIS — M6281 Muscle weakness (generalized): Secondary | ICD-10-CM

## 2022-04-03 DIAGNOSIS — R4701 Aphasia: Secondary | ICD-10-CM

## 2022-04-03 DIAGNOSIS — R269 Unspecified abnormalities of gait and mobility: Secondary | ICD-10-CM

## 2022-04-03 NOTE — Therapy (Signed)
[place] [facility] ?[place], [place], [address] ?Phone: [phone]   Fax:  [phone] ? ?Speech Language Pathology Treatment ? ?Patient Details  ?Name: Casey Adkins ?MRN: [id number] ?Date of Birth: 12-28-1933 ?Referring Provider (SLP): Rusty Aus, MD ? ? ?Encounter Date: 04/03/2022 ? ? End of Session - 04/03/22 1223   ? ? Visit Number 28   ? Number of Visits 37   ? Date for SLP Re-Evaluation 04/28/22   ? SLP Start Time 1017   ? SLP Stop Time  1116   ? SLP Time Calculation (min) 59 min   ? Activity Tolerance Patient tolerated treatment well   ? ?  ?  ? ?  ? ? ?Past Medical History:  ?Diagnosis Date  ? Allergic rhinitis   ? Arthritis   ? Cancer [facility])   ? Bladder Cancer  ? Cancer [facility])   ? Prostate Cancer  ? Diabetes mellitus without complication ([place])   ? Diverticulosis   ? Dysphagia   ? GERD (gastroesophageal reflux disease)   ? History of kidney stones   ? h/o  ? Hypertension   ? Stroke [facility]) 2006  ? possible light stroke-no deficits  ? ? ?Past Surgical History:  ?Procedure Laterality Date  ? CYSTOSCOPY W/ RETROGRADES Bilateral 06/07/2019  ? Procedure: CYSTOSCOPY WITH RETROGRADE PYELOGRAM;  Surgeon: Hollice Espy, MD;  Location: [facility] ORS;  Service: Urology;  Laterality: Bilateral;  ? CYSTOSCOPY WITH BIOPSY N/A 06/07/2019  ? Procedure: CYSTOSCOPY WITH Bladder BIOPSY;  Surgeon: Hollice Espy, MD;  Location: [facility] ORS;  Service: Urology;  Laterality: N/A;  ? ESOPHAGOGASTRODUODENOSCOPY N/A 04/21/2015  ? Procedure: ESOPHAGOGASTRODUODENOSCOPY (EGD);  Surgeon: Hulen Luster, MD;  Location: [facility] ENDOSCOPY;  Service: Gastroenterology;  Laterality: N/A;  ? LITHOTRIPSY    ? SINUS SURGERY WITH INSTATRAK  2008  ? STAPEDES SURGERY Left age 16  ? 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 TONSILLECTOMY    ? ? ?There were no vitals filed for this visit. ? ? Subjective Assessment - 04/03/22 1217   ? ? Subjective "OK, what are we gonna talk  about?"   ? Patient is accompained by: Family member   Wife Manus Gunning  ? Currently in Pain? No/denies   ? ?  ?  ? ?  ? ? ? ? ? ? ? ? ADULT SLP TREATMENT - 04/03/22 1218   ? ?  ? General Information  ? Behavior/Cognition Alert;Cooperative;Pleasant mood   ? HPI Casey Adkins is an 88 y.o. male referred for MBS by his PCP Dr. Emily Filbert. Pt admitted 10/10/21- 10/11/21 for CVA. 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 Unable to obtain MRI due to ear implant. Hx of esophageal dysphagia requiring dilation in the past; reports 1 year hx of worsening dysphagia prior to CVA. Neurology noted right upper motor neuron pattern weakness, suspected small vessel lacunar infarction. 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 Wife assists with history given pt's dysarthria, aphasia.   ?  ? Treatment Provided  ? Treatment provided Cognitive-Linquistic   ?  ? Cognitive-Linquistic Treatment  ? Treatment focused on Dysarthria;Aphasia;Patient/family/caregiver education   ? Skilled Treatment Continued targeting long-term conversation goals for dysarthria and aphasia with mod complex conversation. Pt required only one cue for repetition of unintelligible speech; overall conversational intelligibility judged to be 90%. On 3 occasions, pt demonstrated anomia compensations  and repairing breakdowns by slowing rate and circumlocution.   ?  ? Assessment / Recommendations / Plan  ? Plan Continue with current plan of care   ?  ? Progression Toward Goals  ? Progression toward goals Progressing toward goals   ? ?  ?  ? ?  ? ? ? SLP Education - 04/03/22 1223   ? ? Education Details appropriate conversational loudness   ? Person(s) Educated Patient   ? Methods Explanation   ? Comprehension Verbalized understanding   ? ?  ?  ? ?  ? ? ? SLP Short Term Goals - 02/20/22 1726   ? ?  ? SLP SHORT TERM GOAL #1  ? Title Pt will use aphasia  compensations during 5-8 minutes conversation with occasional min cues.   ? Time 10   ? Period --   sessions  ? Status Achieved   ?  ? SLP SHORT TERM GOAL #2  ? Title Pt will demonstrate dysarthria HEP with rare min cues.   ? Time 10   ? Period --   sessions  ? Status Achieved   ?  ? SLP SHORT TERM GOAL #3  ? Title Patient will use compensations for dysarthria in sentence responses for >95% intelligibility.   ? Time 10   ? Period --   sessions  ? Status Achieved   ?  ? SLP SHORT TERM GOAL #4  ? Title Pt will complete further assessment of cognitive-communication PRN.   ? Time 10   ? Period --   sessions  ? Status Achieved   ?  ? SLP SHORT TERM GOAL #5  ? Title Patient will use compensations for dysarthria in sentence-level reading for >95% intelligibility.   ? Time 10   ? Period --   sessions  ? Status Partially Met   revised after worsening symptoms, possible new CVA on 01/21/22  ?  ? SLP SHORT TERM GOAL #6  ? Title Patient will demonstrate alternating attention over 5 minutes between 2 simple cognitive-linguistic task with 90% accuracy.   ? Time 10   ? Period --   sessions  ? Status Deferred   Deferred due to patient's preference for focus on intelligibility  ? ?  ?  ? ?  ? ? ? SLP Long Term Goals - 02/20/22 1730   ? ?  ? SLP LONG TERM GOAL #1  ? Title Pt will use aphasia compensations effectively during 15 minutes mod complex conversation with modified independence.   ? Time 12   ? Period Weeks   ? Status Revised   revised after worsening symptoms, possible new CVA on 01/21/22  ?  ? SLP LONG TERM GOAL #2  ? Title Pt will use aphasia compensations with modified independence to resolve communication breakdown at least 3 occasions outside of ST, per pt/spouse report.   ? Time 12   ? Period Weeks   ? Status On-going   ?  ? SLP LONG TERM GOAL #3  ? Title Patient will use compensations for dysarthria in 10 minute conversation for >80% intelligibility with no more than 2 repetitions necessary.   ? Time 12   ? Period Weeks    ? Status Revised   Revised due to progress/pt's demands  ?  ? SLP LONG TERM GOAL #4  ? Title Patient will demonstrate swallowing precautions independently x3 sessions with no overt s/sx aspiration.   ? Time 12   ? Period Weeks   ? Status On-going   ? ?  ?  ? ?  ? ? ?   Plan - 04/03/22 1224   ? ? Clinical Impression Statement Patient continues with moderate dysarthria, mild aphasia, and cognitive communication deficits. Conversational intelligibility today 90%, and pt demonstrated use of anomia compensations x3 without cues. Continue skilled ST with focus on conversational carryover of strategies to improve intelligibility with communication partners. Pt progressing toward LTGs; anticipate d/c in next 1-3 sessions. I recommend continued skilled ST to improve intelligibility, verbal expression and swallowing safety necessary for increased communicatory success and independence in the home and community environments.   ? Speech Therapy Frequency 1x /week   will decrease to 1x per week beginning in May  ? Duration 12 weeks   ? Treatment/Interventions Aspiration precaution training;Environmental controls;Language facilitation;Cueing hierarchy;Oral motor exercises;SLP instruction and feedback;Pharyngeal strengthening exercises;Compensatory techniques;Cognitive reorganization;Functional tasks;Compensatory strategies;Diet toleration management by SLP;Internal/external aids;Multimodal communcation approach;Patient/family education   ? Potential to Achieve Goals Good   ? Consulted and Agree with Plan of Care Family member/caregiver;Patient   ? ?  ?  ? ?  ? ? ?Patient will benefit from skilled therapeutic intervention in order to improve the following deficits and impairments:   ?Aphasia ? ?Dysarthria and anarthria ? ? ? ?Problem List ?Patient Active Problem List  ? Diagnosis Date Noted  ? HLD (hyperlipidemia) 01/21/2022  ? Essential tremor 01/21/2022  ? Stroke-like episode   ? Overweight (BMI 25.0-29.9) 10/11/2021  ?  Hypertriglyceridemia 10/11/2021  ? Stroke-like symptom 10/10/2021  ? Diabetes mellitus without complication (HCC) 10/10/2021  ? HTN (hypertension) 10/10/2021  ? Pyuria 12/25/2016  ? 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

## 2022-04-03 NOTE — Therapy (Signed)
[facility] ?[place], [place], [address] ?Phone: [phone]   Fax:  [phone] ? ?Physical Therapy Treatment ?Physical Therapy Progress Note ? ? ?Dates of reporting period  01/31/22   to   04/03/22 ? ? ?Patient Details  ?Name: Casey Adkins ?MRN: [id number] ?Date of Birth: [birth year] ?Referring Provider (PT): Dr. Emily Filbert ? ? ?Encounter Date: 04/03/2022 ? ? PT End of Session - 04/03/22 1020   ? ? Visit Number 10   ? Number of Visits 25   ? Date for PT Re-Evaluation 04/25/22   ? Authorization Type UHC Medicare   ? Authorization Time Period initial cert 12/27/9796- 07/27/1193   ? Progress Note Due on Visit 20   ? PT Start Time 1740   ? PT Stop Time 1015   ? PT Time Calculation (min) 40 min   ? Equipment Utilized During Treatment Gait belt   ? Activity Tolerance Patient tolerated treatment well;No increased pain;Patient limited by fatigue   ? Behavior During Therapy [facility] for tasks assessed/performed   ? ?  ?  ? ?  ? ? ?Past Medical History:  ?Diagnosis Date  ? Allergic rhinitis   ? Arthritis   ? Cancer [facility])   ? Bladder Cancer  ? Cancer [facility])   ? Prostate Cancer  ? Diabetes mellitus without complication ([place])   ? Diverticulosis   ? Dysphagia   ? GERD (gastroesophageal reflux disease)   ? History of kidney stones   ? h/o  ? Hypertension   ? Stroke [facility]) 2006  ? possible light stroke-no deficits  ? ? ?Past Surgical History:  ?Procedure Laterality Date  ? CYSTOSCOPY W/ RETROGRADES Bilateral 06/07/2019  ? Procedure: CYSTOSCOPY WITH RETROGRADE PYELOGRAM;  Surgeon: Hollice Espy, MD;  Location: [facility] ORS;  Service: Urology;  Laterality: Bilateral;  ? CYSTOSCOPY WITH BIOPSY N/A 06/07/2019  ? Procedure: CYSTOSCOPY WITH Bladder BIOPSY;  Surgeon: Hollice Espy, MD;  Location: [facility] ORS;  Service: Urology;  Laterality: N/A;  ? ESOPHAGOGASTRODUODENOSCOPY N/A 04/21/2015  ? Procedure: ESOPHAGOGASTRODUODENOSCOPY (EGD);  Surgeon: Hulen Luster, MD;  Location: [facility]  ENDOSCOPY;  Service: Gastroenterology;  Laterality: N/A;  ? LITHOTRIPSY    ? SINUS SURGERY WITH INSTATRAK  2008  ? STAPEDES SURGERY Left age 58  ? 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 TONSILLECTOMY    ? ? ?There were no vitals filed for this visit. ? ? Subjective Assessment - 04/03/22 1047   ? ? Subjective Pt doing well today, no updates since last visit. He will see ENT later today.   ? Patient is accompained by: Family member   ? Pertinent History Casey [facility] is an 88 y.o. male referred by his PCP Dr. Emily Filbert for weakness. Pt was admitted on 10/10/21- 10/11/21 for CVA. 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 Unable to obtain MRI due to ear implant. Hx of esophageal dysphagia requiring dilation in the past; reports 1 year hx of worsening dysphagia prior to CVA.   ? Currently in Pain? No/denies   ? ?  ?  ? ?  ? ? ? ? ? [facility] PT Assessment - 04/03/22 0001   ? ?  ? Observation/Other Assessments  ? Focus on Therapeutic Outcomes (FOTO)  56   60 at eval  ?  ? Berg Balance Test  ? Sit to Stand Able to stand  independently using  hands   ? Standing Unsupported Able to stand safely 2 minutes   ? Sitting with Back Unsupported but Feet Supported on Floor or Stool Able to sit safely and securely 2 minutes   ? Stand to Sit Sits safely with minimal use of hands   ? Transfers Able to transfer safely, minor use of hands   ? Standing Unsupported with Eyes Closed Able to stand 10 seconds safely   ? Standing Unsupported with Feet Together Able to place feet together independently and stand 1 minute safely   ? From Standing, Reach Forward with Outstretched Arm Can reach confidently >25 cm (10")   ? From Standing Position, Pick up Object from Floor Able to pick up shoe, needs supervision   ? From Standing Position, Turn to Look Behind Over each Shoulder Looks behind one side only/other side shows less weight shift   ? Turn  360 Degrees Able to turn 360 degrees safely one side only in 4 seconds or less   ? Standing Unsupported, Alternately Place Feet on Step/Stool Able to stand independently and complete 8 steps >20 seconds   ? Standing Unsupported, One Foot in Front Able to take small step independently and hold 30 seconds   ? Standing on One Leg Tries to lift leg/unable to hold 3 seconds but remains standing independently   ? Total Score 46   ? ?  ?  ? ?  ? ?5xSTS x2, FOTO [place], TUG, 10MWT  ?-Left 360 degree turns on red mat ?-Left donuts around chair straddling red mat and firm surface x5 ?*pt requires seated rest intervals 5-6x in session  ? ? PT Education - 04/03/22 1042   ? ? Education Details safe practice at home.   ? Person(s) Educated Patient   ? Methods Explanation;Demonstration   ? Comprehension Verbalized understanding;Need further instruction   ? ?  ?  ? ?  ? ? ? PT Short Term Goals - 04/03/22 1046   ? ?  ? PT SHORT TERM GOAL #1  ? Title Pt will be independent with initial HEP in order to improve strength and balance in order to decrease fall risk and improve function at home and work.   ? Baseline Eval= No formal HEP in place; 5/10: lost handout, not consistently doing any prescibed HEP   ? Time 6   ? Period Weeks   ? Status On-going   ? Target Date 03/14/22   ? ?  ?  ? ?  ? ? ? ? PT Long Term Goals - 04/03/22 0940   ? ?  ? PT LONG TERM GOAL #1  ? Title Pt will be independent with final HEP in order to improve strength and balance in order to decrease fall risk and improve function at home and work.   ? Baseline Eval= No formal HEP in place   ? Time 12   ? Period Weeks   ? Status On-going   ? Target Date 04/25/22   ?  ? PT LONG TERM GOAL #2  ? Title Pt will improve FOTO to target score of to display perceived improvements in ability to complete ADL's.   ? Baseline Eval= 60; 5/10: 56   ? Time 12   ? Period Weeks   ? Status On-going   ? Target Date 04/25/22   ?  ? PT LONG TERM GOAL #3  ? Title Pt will improve BERG by at  least 3 points in order to demonstrate clinically significant improvement in balance.   ?  Baseline Eval= 36; 04/03/22: 46/56   ? Time 12   ? Period Weeks   ? Status New   ? Target Date 04/25/22   ?  ? PT LONG TERM GOAL #4  ? Title Pt will decrease 5TSTS by at least 3 seconds in order to demonstrate clinically significant improvement in LE strength.   ? Baseline Eval= 23.3 sec with BUE Support; 5/10: 20sec c chair arms; 17.623 sec from chair + airex   ? Time 12   ? Period Weeks   ? Status Achieved   ? Target Date 04/25/22   ?  ? PT LONG TERM GOAL #5  ? Title Pt will decrease TUG to below 20 seconds/decrease in order to demonstrate decreased fall risk.   ? Baseline Eval= 31.44 with use of RW; 04/03/22: 18.96sec c SPC (carried not used)   ? Time 12   ? Period Weeks   ? Status On-going   ? Target Date 04/25/22   ?  ? PT LONG TERM GOAL #6  ? Title Pt will increase 10MWT by at least 0.13 m/s in order to demonstrate clinically significant improvement in community ambulation.   ? Baseline Eval= 0.62 m/s using RW; 5/10: 0.24ms c SPC (carried not used)   ? Time 12   ? Period Weeks   ? Status Achieved   ? Target Date 04/25/22   ? ?  ?  ? ?  ? ? ? ? ? ? ? ? Plan - 04/03/22 1042   ? ? Clinical Impression Statement 10th visit reassessment this date. Pt showing excellent progress in all testing, improved performance in [employer] 10 meter walk test, Timed up and Go, five times sit to stand. FOTO score shows decreased points sinc eevaluation, however pt does feel he has made big improvements since evaluation. Pt conitnues to express concern with stability during all AMB outside of straight plane, firm indoor surfaces. Finished session with Left sided turning on a variety of surfaces including soft trippy ones.   ? Personal Factors and Comorbidities Age;Comorbidity 3+   ? Comorbidities Diabetes, CVA, HTN   ? Examination-Activity Limitations Caring for Others;Lift;Squat;Stairs;Transfers   ? Examination-Participation  Restrictions Cleaning;Community Activity;YValla LeaverWork   ? Stability/Clinical Decision Making Evolving/Moderate complexity   ? Clinical Decision Making Moderate   ? Rehab Potential Good   ? PT Frequency 2x / week   ? PT Dura

## 2022-04-05 ENCOUNTER — Ambulatory Visit: Payer: Medicare Other | Admitting: Physical Therapy

## 2022-04-08 ENCOUNTER — Ambulatory Visit: Payer: Medicare Other | Admitting: Physical Therapy

## 2022-04-08 ENCOUNTER — Encounter: Payer: Medicare Other | Admitting: Occupational Therapy

## 2022-04-08 ENCOUNTER — Encounter: Payer: Medicare Other | Admitting: Speech Pathology

## 2022-04-10 ENCOUNTER — Ambulatory Visit: Payer: Medicare Other

## 2022-04-10 ENCOUNTER — Ambulatory Visit: Payer: Medicare Other | Admitting: Speech Pathology

## 2022-04-10 DIAGNOSIS — R269 Unspecified abnormalities of gait and mobility: Secondary | ICD-10-CM

## 2022-04-10 DIAGNOSIS — R278 Other lack of coordination: Secondary | ICD-10-CM

## 2022-04-10 DIAGNOSIS — R471 Dysarthria and anarthria: Secondary | ICD-10-CM

## 2022-04-10 DIAGNOSIS — M6281 Muscle weakness (generalized): Secondary | ICD-10-CM

## 2022-04-10 DIAGNOSIS — R4701 Aphasia: Secondary | ICD-10-CM

## 2022-04-10 DIAGNOSIS — R2681 Unsteadiness on feet: Secondary | ICD-10-CM

## 2022-04-10 DIAGNOSIS — R262 Difficulty in walking, not elsewhere classified: Secondary | ICD-10-CM

## 2022-04-10 NOTE — Therapy (Signed)
[place] [facility] [address] [place], [place], [address] Phone: [phone]   Fax:  [phone]  Physical Therapy Treatment  Patient Details  Name: Casey [facility] MRN: [id number] Date of Birth: October 23, 1934 Referring Provider (PT): Dr. Emily Filbert   Encounter Date: 04/10/2022   PT End of Session - 04/10/22 1015     Visit Number 11    Number of Visits 25    Date for PT Re-Evaluation 04/25/22    Authorization Type UHC Medicare    Authorization Time Period initial cert 05/25/2457- 0/07/9832    Progress Note Due on Visit 20    PT Start Time 1015    PT Stop Time 1059    PT Time Calculation (min) 44 min    Equipment Utilized During Treatment Gait belt    Activity Tolerance Patient tolerated treatment well;No increased pain;Patient limited by fatigue    Behavior During Therapy [facility] for tasks assessed/performed             Past Medical History:  Diagnosis Date   Allergic rhinitis    Arthritis    Cancer ([place])    Bladder Cancer   Cancer [facility])    Prostate Cancer   Diabetes mellitus without complication ([place])    Diverticulosis    Dysphagia    GERD (gastroesophageal reflux disease)    History of kidney stones    h/o   Hypertension    Stroke [facility]) 2006   possible light stroke-no deficits    Past Surgical History:  Procedure Laterality Date   CYSTOSCOPY W/ RETROGRADES Bilateral 06/07/2019   Procedure: CYSTOSCOPY WITH RETROGRADE PYELOGRAM;  Surgeon: Hollice Espy, MD;  Location: [facility] ORS;  Service: Urology;  Laterality: Bilateral;   CYSTOSCOPY WITH BIOPSY N/A 06/07/2019   Procedure: CYSTOSCOPY WITH Bladder BIOPSY;  Surgeon: Hollice Espy, MD;  Location: [facility] ORS;  Service: Urology;  Laterality: N/A;   ESOPHAGOGASTRODUODENOSCOPY N/A 04/21/2015   Procedure: ESOPHAGOGASTRODUODENOSCOPY (EGD);  Surgeon: Hulen Luster, MD;  Location: [facility] ENDOSCOPY;  Service: Gastroenterology;  Laterality: N/A;   LITHOTRIPSY     SINUS SURGERY WITH  INSTATRAK  2008   STAPEDES SURGERY Left age 19   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   Subjective Assessment - 04/10/22 1014     Subjective Patient reports he did well after last visit and no new issues- He does endorse difficulty turning to his left.    Patient is accompained by: Family member    Pertinent History Casey [facility] is an 88 y.o. male referred by his PCP Dr. Emily Filbert for weakness. Pt was admitted on 10/10/21- 10/11/21 for CVA. 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 Unable to obtain MRI due to ear implant. Hx of esophageal dysphagia requiring dilation in the past; reports 1 year hx of worsening dysphagia prior to CVA.    Currently in Pain? No/denies             INTERVENTIONS:    Neuro re-ed:   Dynamic march on airex pad- 20 reps each side (minimal height)   Ant/post weight shift- standing on airex pad x 20 reps-   Static stand on airex pad with horizontal head turns x 10 reps- unsteady  Dynamic Lateral step tap (standing on airex pad) with 2 cones placed on each side of  pad- x 15 reps each side.   -lateral step up/over hedgehog x reps each side without BUE support (as able but patient was very challenged requiring 1 UE support at times and not always stepping over   - 360 turning training: Attempted each side multiple times- patient required increased time turning to left vs. Right  -Forward step up/over 3 (1/2 foam) in // bars - initially with BUE Support but able to progress to no UE support x 10 down and back.    Education provided throughout session via VC/TC and demonstration to facilitate movement at target joints and correct muscle activation for all testing and exercises performed.                            PT Education - 04/11/22 1705     Education Details  Exercise technique    Person(s) Educated Patient    Methods Explanation;Demonstration;Tactile cues;Verbal cues    Comprehension Verbalized understanding;Returned demonstration;Verbal cues required;Tactile cues required;Need further instruction              PT Short Term Goals - 04/03/22 1046       PT SHORT TERM GOAL #1   Title Pt will be independent with initial HEP in order to improve strength and balance in order to decrease fall risk and improve function at home and work.    Baseline Eval= No formal HEP in place; 5/10: lost handout, not consistently doing any prescibed HEP    Time 6    Period Weeks    Status On-going    Target Date 03/14/22               PT Long Term Goals - 04/03/22 0940       PT LONG TERM GOAL #1   Title Pt will be independent with final HEP in order to improve strength and balance in order to decrease fall risk and improve function at home and work.    Baseline Eval= No formal HEP in place    Time 12    Period Weeks    Status On-going    Target Date 04/25/22      PT LONG TERM GOAL #2   Title Pt will improve FOTO to target score of to display perceived improvements in ability to complete ADL's.    Baseline Eval= 60; 5/10: 56    Time 12    Period Weeks    Status On-going    Target Date 04/25/22      PT LONG TERM GOAL #3   Title Pt will improve BERG by at least 3 points in order to demonstrate clinically significant improvement in balance.    Baseline Eval= 36; 04/03/22: 46/56    Time 12    Period Weeks    Status New    Target Date 04/25/22      PT LONG TERM GOAL #4   Title Pt will decrease 5TSTS by at least 3 seconds in order to demonstrate clinically significant improvement in LE strength.    Baseline Eval= 23.3 sec with BUE Support; 5/10: 20sec c chair arms; 17.623 sec from chair + airex    Time 12    Period Weeks    Status Achieved    Target Date 04/25/22      PT LONG TERM GOAL #5   Title Pt will decrease TUG to below 20  seconds/decrease in order to demonstrate decreased fall risk.    Baseline Eval=  31.44 with use of RW; 04/03/22: 18.96sec c SPC (carried not used)    Time 12    Period Weeks    Status On-going    Target Date 04/25/22      PT LONG TERM GOAL #6   Title Pt will increase 10MWT by at least 0.13 m/s in order to demonstrate clinically significant improvement in community ambulation.    Baseline Eval= 0.62 m/s using RW; 5/10: 0.86ms c SPC (carried not used)    Time 12    Period Weeks    Status Achieved    Target Date 04/25/22                   Plan - 04/10/22 1015     Clinical Impression Statement Patient presents with good motivation for today's session. He was able to work on turning to left which was a struggle initially- Did improve during session with VC and cues for step technique. He also struggled initially with lateral step up/over hedgehog but improved overall with practice.  Pt will continue to benefit from skilled Pt interventions to improve his LE balance, stability and reduce risk of falls.    Personal Factors and Comorbidities Age;Comorbidity 3+    Comorbidities Diabetes, CVA, HTN    Examination-Activity Limitations Caring for Others;Lift;Squat;Stairs;Transfers    Examination-Participation Restrictions Cleaning;Community Activity;Yard Work    SMerchant navy officerEvolving/Moderate complexity    Rehab Potential Good    PT Frequency 2x / week    PT Duration 12 weeks    PT Treatment/Interventions ADLs/Self Care Home Management;Canalith Repostioning;Cryotherapy;Electrical Stimulation;Moist Heat;Traction;Ultrasound;DME Instruction;Gait training;Stair training;Functional mobility training;Therapeutic activities;Therapeutic exercise;Balance training;Neuromuscular re-education;Patient/family education;Manual techniques;Passive range of motion;Dry needling;Vestibular    PT Next Visit Plan Left turning, soft surfaces, semitanden, frontal plan mobility, update HEP     PT Home Exercise Plan Access Code: [email or web address]  URL: [email or web address]; 04/03/22: wife and pt reports not having handout since first visit; likely in need of updates next session.    Consulted and Agree with Plan of Care Patient             Patient will benefit from skilled therapeutic intervention in order to improve the following deficits and impairments:  Abnormal gait, Decreased activity tolerance, Decreased balance, Decreased coordination, Decreased endurance, Decreased mobility, Decreased range of motion, Decreased strength, Difficulty walking, Hypomobility, Impaired flexibility  Visit Diagnosis: Other lack of coordination  Unsteadiness on feet  Muscle weakness (generalized)  Abnormality of gait and mobility  Difficulty in walking, not elsewhere classified     Problem List Patient Active Problem List   Diagnosis Date Noted   HLD (hyperlipidemia) 01/21/2022   Essential tremor 01/21/2022   Stroke-like episode    Overweight (BMI 25.0-29.9) 10/11/2021   Hypertriglyceridemia 10/11/2021   Stroke-like symptom 10/10/2021   Diabetes mellitus without complication [place] 138/25/0539  HTN (hypertension) 10/10/2021   Pyuria 12/25/2016   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   Medicare annual wellness visit, initial 07/17/2016   B12 deficiency 07/17/2016   History of CVA (cerebrovascular accident) without residual deficits 05/14/2016   PIN III (prostatic intraepithelial neoplasia III) 04/11/2015   Personal history of other malignant neoplasm of skin 09/16/2014   History of nonmelanoma skin cancer 09/16/2014   History of colonic polyps 08/23/2014   Diverticulosis of large intestine 08/23/2014   Benign hematuria 07/20/2014   Type 2 diabetes mellitus [place] 07/04/2014   Malignant [place] of bladder [place] 07/04/2014   DM (diabetes mellitus) type II controlled with renal manifestation ([place] 07/04/2014   Benign essential hypertension  07/04/2014   Urge incontinence  12/29/2013   Malignant neoplasm of lateral wall of urinary bladder ([place]) 09/09/2012   Incomplete emptying of bladder 09/09/2012   Enlarged prostate with lower urinary tract symptoms (LUTS) 09/09/2012   Elevated prostate specific antigen (PSA) 09/09/2012   Chronic prostatitis 09/09/2012   Carcinoma in situ of prostate 09/09/2012   Kidney stone 09/09/2012   Benign prostatic hyperplasia with urinary obstruction 09/09/2012  Rationale for Evaluation and Treatment Rehabilitation   Lewis Moccasin, PT 04/11/2022, 5:22 PM  [place] [address] [place], [place], [address] Phone: [phone]   Fax:  [phone]  Name: Casey Adkins MRN: [id number] Date of Birth: [birth year]

## 2022-04-10 NOTE — Patient Instructions (Signed)
Keep using your SLOP strategies (Slow, Loud, Overpronounce, Pause). ? ?Have a loud face-to-face conversation at least once a day.  ?You can also try reading out loud.  ?

## 2022-04-11 NOTE — Therapy (Signed)
[place] MAIN [facility] SERVICES [address] [place], [place], [address] Phone: [phone]   Fax:  [phone]  Speech Language Pathology Treatment and Discharge Summary  Patient Details  Name: Casey Adkins MRN: [id number] Date of Birth: [birth year] Referring Provider (SLP): Rusty Aus, MD   Encounter Date: 04/10/2022   End of Session - 04/11/22 0916     Visit Number 29   Number of Visits 92    Date for SLP Re-Evaluation 04/28/22    SLP Start Time 0900    SLP Stop Time  1000    SLP Time Calculation (min) 60 min    Activity Tolerance Patient tolerated treatment well             Past Medical History:  Diagnosis Date   Allergic rhinitis    Arthritis    Cancer ([place])    Bladder Cancer   Cancer [facility])    Prostate Cancer   Diabetes mellitus without complication ([place])    Diverticulosis    Dysphagia    GERD (gastroesophageal reflux disease)    History of kidney stones    h/o   Hypertension    Stroke [facility]) 2006   possible light stroke-no deficits    Past Surgical History:  Procedure Laterality Date   CYSTOSCOPY W/ RETROGRADES Bilateral 06/07/2019   Procedure: CYSTOSCOPY WITH RETROGRADE PYELOGRAM;  Surgeon: Hollice Espy, MD;  Location: [facility] ORS;  Service: Urology;  Laterality: Bilateral;   CYSTOSCOPY WITH BIOPSY N/A 06/07/2019   Procedure: CYSTOSCOPY WITH Bladder BIOPSY;  Surgeon: Hollice Espy, MD;  Location: [facility] ORS;  Service: Urology;  Laterality: N/A;   ESOPHAGOGASTRODUODENOSCOPY N/A 04/21/2015   Procedure: ESOPHAGOGASTRODUODENOSCOPY (EGD);  Surgeon: Hulen Luster, MD;  Location: [facility] ENDOSCOPY;  Service: Gastroenterology;  Laterality: N/A;   LITHOTRIPSY     SINUS SURGERY WITH INSTATRAK  2008   STAPEDES SURGERY Left age 36   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   Subjective Assessment - 04/11/22 0909     Subjective "So am I  gonna graduate?"    Patient is accompained by: Family member   wife Casey Adkins   Currently in Pain? No/denies                   ADULT SLP TREATMENT - 04/11/22 0909       General Information   Behavior/Cognition Alert;Cooperative;Pleasant mood    HPI Casey Adkins is an 88 y.o. male referred for MBS by his PCP Dr. Emily Filbert. Pt admitted 10/10/21- 10/11/21 for CVA. 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 Unable to obtain MRI due to ear implant. Hx of esophageal dysphagia requiring dilation in the past; reports 1 year hx of worsening dysphagia prior to CVA. Neurology noted right upper motor neuron pattern weakness, suspected small vessel lacunar infarction. 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 Wife assists with history given pt's dysarthria, aphasia.      Treatment Provided   Treatment provided Cognitive-Linquistic      Cognitive-Linquistic Treatment   Treatment focused on Dysarthria;Aphasia;Patient/family/caregiver education    Skilled Treatment ST session with focus on pt's long-term communication goals. Engaged pt in mod complex conversations 15 min x 4 with pt demonstrating dysarthria compensations and aphasia compensations effectively for 95% intelligibility to familiar listener, 85% for unfamiliar  listener (new Probation officer). Pt self-monitored and repeated himself on several occasions. When anomia occurred x3, pt compensated functionally by using a replacement word or description with mildly extended time. SLP reviewed compensations as well as activities for home to maintain/continue therapeutic gains. Pt reports pleased with current functional level and ready for d/c today.      Assessment / Recommendations / Plan   Plan Discharge SLP treatment due to (comment)   pt satisfied with current functional level     Progression Toward Goals   Progression  toward goals --   Goals partially met, pt pleased with current functional level and ready for d/c today               SLP Short Term Goals - 04/11/22 0910       SLP SHORT TERM GOAL #1   Title Pt will use aphasia compensations during 5-8 minutes conversation with occasional min cues.    Time 10    Period --   sessions   Status Achieved      SLP SHORT TERM GOAL #2   Title Pt will demonstrate dysarthria HEP with rare min cues.    Time 10    Period --   sessions   Status Achieved      SLP SHORT TERM GOAL #3   Title Patient will use compensations for dysarthria in sentence responses for >95% intelligibility.    Time 10    Period --   sessions   Status Achieved      SLP SHORT TERM GOAL #4   Title Pt will complete further assessment of cognitive-communication PRN.    Time 10    Period --   sessions   Status Achieved      SLP SHORT TERM GOAL #5   Title Patient will use compensations for dysarthria in sentence-level reading for >95% intelligibility.    Time 10    Period --   sessions   Status Partially Met   revised after worsening symptoms, possible new CVA on 01/21/22     SLP SHORT TERM GOAL #6   Title Patient will demonstrate alternating attention over 5 minutes between 2 simple cognitive-linguistic task with 90% accuracy.    Time 10    Period --   sessions   Status Deferred   Deferred due to patient's preference for focus on intelligibility             SLP Long Term Goals - 04/11/22 0910       SLP LONG TERM GOAL #1   Title Pt will use aphasia compensations effectively during 15 minutes mod complex conversation with modified independence.    Time 12    Period Weeks    Status Achieved     SLP LONG TERM GOAL #2   Title Pt will use aphasia compensations with modified independence to resolve communication breakdown at least 3 occasions outside of ST, per pt/spouse report.    Time 12    Period Weeks    Status Partially Met   pt reports limited opportunities for  this     SLP LONG TERM GOAL #3   Title Patient will use compensations for dysarthria in 10 minute conversation for >80% intelligibility with no more than 2 repetitions necessary.    Time 12    Period Weeks    Status Achieved      SLP LONG TERM GOAL #4   Title Patient will demonstrate swallowing precautions independently x3 sessions with no overt s/sx  aspiration.    Time 12    Period Weeks    Status Partially Met   due to focus on dysarthria per pt preference; pt, spouse report pt tolerating regular diet/thin liquids at home; no overt s/sx aspiration when sipping water during sessions.             Plan - 04/11/22 0917     Clinical Impression Statement Pt is compensating functionally for moderate dysarthria and mild aphasia at this time. Patient and spouse report he is tolerating regular diet and thin liquids without overt signs of aspiration when he is using his aspiration precautions (slow rate, small sips, sitting fully upright). Patient used dysarthria and aphasia compensations effectively in 15 minute conversations today. He reports he is pleased with current functional level and is in agreement with d/c today, 3/5 LTGs met.    Speech Therapy Frequency --   d/c   Duration --   d/c   Treatment/Interventions Aspiration precaution training;Environmental controls;Language facilitation;Cueing hierarchy;Oral motor exercises;SLP instruction and feedback;Pharyngeal strengthening exercises;Compensatory techniques;Cognitive reorganization;Functional tasks;Compensatory strategies;Diet toleration management by SLP;Internal/external aids;Multimodal communcation approach;Patient/family education    Potential to Achieve Goals Good    Consulted and Agree with Plan of Care Family member/caregiver;Patient    Family Member Consulted spouse Casey Adkins            SPEECH THERAPY DISCHARGE SUMMARY  Visits from Start of Care: 29  Current functional level related to goals / functional outcomes: Patient  demonstrating effective use of dysarthria and aphasia compensations during 15 minutes conversation for 95% intelligibility to familiar listener. Reports minimal opportunities to resolve breakdowns outside of ST due to limited number of conversations.   Remaining deficits: Moderate dysarthria, mild aphasia, and mild cognitive deficits   Education / Equipment: Create opportunities/intentional conversations daily. Activities to support maintenance of therapeutic gains.    Patient agrees to discharge. Patient goals were partially met. Patient is being discharged due to being pleased with the current functional level..      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   Medicare annual wellness visit, initial 07/17/2016   B12 deficiency 07/17/2016   History of CVA (cerebrovascular accident) without residual deficits 05/14/2016   PIN III (prostatic intraepithelial neoplasia III) 04/11/2015   Personal history of other malignant neoplasm of skin 09/16/2014   History of nonmelanoma skin cancer 09/16/2014   History of colonic polyps 08/23/2014   Diverticulosis of large intestine 08/23/2014   Benign hematuria 07/20/2014   Type 2 diabetes mellitus ([place]) 07/04/2014   Malignant neoplasm of bladder ([place]) 07/04/2014   DM (diabetes mellitus) type II controlled with renal manifestation ([place]) 07/04/2014   Benign essential hypertension 07/04/2014   Urge incontinence 12/29/2013    Malignant neoplasm of lateral wall of urinary bladder ([place]) 09/09/2012   Incomplete emptying of bladder 09/09/2012   Enlarged prostate with lower urinary tract symptoms (LUTS) 09/09/2012   Elevated prostate specific antigen (PSA) 09/09/2012   Chronic prostatitis 09/09/2012   Carcinoma in situ of prostate 09/09/2012   Kidney stone 09/09/2012  Benign prostatic hyperplasia with urinary obstruction 09/09/2012   Deneise Lever, [place], CCC-SLP Speech-Language Pathologist [phone]  Aliene Altes, [place] 04/11/2022, 9:25 AM  [place] MAIN [facility] SERVICES [address] [place], [place], [address] Phone: [phone]   Fax:  [phone]   Name: Casey Adkins MRN: [id number] Date of Birth: May 15, 1934

## 2022-04-15 ENCOUNTER — Ambulatory Visit: Payer: Medicare Other

## 2022-04-15 ENCOUNTER — Encounter: Payer: Medicare Other | Admitting: Speech Pathology

## 2022-04-17 ENCOUNTER — Encounter: Payer: Self-pay | Admitting: Physical Therapy

## 2022-04-17 ENCOUNTER — Encounter: Payer: Medicare Other | Admitting: Speech Pathology

## 2022-04-17 ENCOUNTER — Ambulatory Visit: Payer: Medicare Other | Admitting: Physical Therapy

## 2022-04-17 DIAGNOSIS — M6281 Muscle weakness (generalized): Secondary | ICD-10-CM

## 2022-04-17 DIAGNOSIS — R2681 Unsteadiness on feet: Secondary | ICD-10-CM

## 2022-04-17 DIAGNOSIS — R269 Unspecified abnormalities of gait and mobility: Secondary | ICD-10-CM

## 2022-04-17 DIAGNOSIS — R262 Difficulty in walking, not elsewhere classified: Secondary | ICD-10-CM

## 2022-04-17 DIAGNOSIS — R471 Dysarthria and anarthria: Secondary | ICD-10-CM | POA: Diagnosis not present

## 2022-04-17 NOTE — Therapy (Signed)
[place] MAIN [facility] SERVICES [address] [place], [place], [address] Phone: [phone]   Fax:  [phone]  Physical Therapy Treatment  Patient Details  Name: Casey Adkins MRN: [id number] Date of Birth: [birth year] Referring Provider (PT): Dr. Emily Filbert   Encounter Date: 04/17/2022   PT End of Session - 04/17/22 1020     Visit Number 12    Number of Visits 25    Date for PT Re-Evaluation 04/25/22    Authorization Type [employer] Medicare    Authorization Time Period initial cert 01/31/1828- 07/28/7168    Progress Note Due on Visit 20    PT Start Time 1017    PT Stop Time 1059    PT Time Calculation (min) 42 min    Equipment Utilized During Treatment Gait belt    Activity Tolerance Patient tolerated treatment well;No increased pain;Patient limited by fatigue    Behavior During Therapy [facility] for tasks assessed/performed             Past Medical History:  Diagnosis Date   Allergic rhinitis    Arthritis    Cancer ([place])    Bladder Cancer   Cancer [facility])    Prostate Cancer   Diabetes mellitus without complication ([place])    Diverticulosis    Dysphagia    GERD (gastroesophageal reflux disease)    History of kidney stones    h/o   Hypertension    Stroke [facility]) 2006   possible light stroke-no deficits    Past Surgical History:  Procedure Laterality Date   CYSTOSCOPY W/ RETROGRADES Bilateral 06/07/2019   Procedure: CYSTOSCOPY WITH RETROGRADE PYELOGRAM;  Surgeon: Hollice Espy, MD;  Location: [facility] ORS;  Service: Urology;  Laterality: Bilateral;   CYSTOSCOPY WITH BIOPSY N/A 06/07/2019   Procedure: CYSTOSCOPY WITH Bladder BIOPSY;  Surgeon: Hollice Espy, MD;  Location: [facility] ORS;  Service: Urology;  Laterality: N/A;   ESOPHAGOGASTRODUODENOSCOPY N/A 04/21/2015   Procedure: ESOPHAGOGASTRODUODENOSCOPY (EGD);  Surgeon: Hulen Luster, MD;  Location: [facility] ENDOSCOPY;  Service: Gastroenterology;  Laterality: N/A;   LITHOTRIPSY     SINUS SURGERY WITH  INSTATRAK  2008   STAPEDES SURGERY Left age 35   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   Subjective Assessment - 04/17/22 1017     Subjective Patient reports he did well after last visit and no new issues- He does endorse difficulty turning to his left.    Patient is accompained by: Family member    Pertinent History Casey Adkins is an 88 y.o. male referred by his PCP Dr. Emily Filbert for weakness. Pt was admitted on 10/10/21- 10/11/21 for CVA. 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 Unable to obtain MRI due to ear implant. Hx of esophageal dysphagia requiring dilation in the past; reports 1 year hx of worsening dysphagia prior to CVA.    Currently in Pain? No/denies               INTERVENTIONS:    Neuro re-ed: Unless otherwise stated, CGA was provided and gait belt donned in order to ensure pt safety   Dynamic turns in // bars x 5 rounds turning to left on airex pad then walking to another airex pad   Balance course involving 3 turns in clinic, no AD  - added airex turns x  3 rounds, increased difficulty, difficulty with picking up feet for turns,   -Forward step up/over 3 (1/2 foam) plus hurdle in // bars - initially with BUE Support but able to progress to no UE support x 10 down and back.  Standing turns in 360 degrees with no AD and nuo UE support, PT noticed decreased SLS time on R LE,      Dynamic marching with cues for large step movements 3 x 5 on ea LE wit honly counting large movements      Education provided throughout session via VC/TC and demonstration to facilitate movement at target joints and correct muscle activation for all testing and exercises performed.                              PT Education - 04/17/22 1019     Education Details Exercise technique     Person(s) Educated Patient    Methods Explanation;Demonstration    Comprehension Verbalized understanding;Returned demonstration;Verbal cues required;Tactile cues required              PT Short Term Goals - 04/03/22 1046       PT SHORT TERM GOAL #1   Title Pt will be independent with initial HEP in order to improve strength and balance in order to decrease fall risk and improve function at home and work.    Baseline Eval= No formal HEP in place; 5/10: lost handout, not consistently doing any prescibed HEP    Time 6    Period Weeks    Status On-going    Target Date 03/14/22               PT Long Term Goals - 04/03/22 0940       PT LONG TERM GOAL #1   Title Pt will be independent with final HEP in order to improve strength and balance in order to decrease fall risk and improve function at home and work.    Baseline Eval= No formal HEP in place    Time 12    Period Weeks    Status On-going    Target Date 04/25/22      PT LONG TERM GOAL #2   Title Pt will improve FOTO to target score of to display perceived improvements in ability to complete ADL's.    Baseline Eval= 60; 5/10: 56    Time 12    Period Weeks    Status On-going    Target Date 04/25/22      PT LONG TERM GOAL #3   Title Pt will improve BERG by at least 3 points in order to demonstrate clinically significant improvement in balance.    Baseline Eval= 36; 04/03/22: 46/56    Time 12    Period Weeks    Status New    Target Date 04/25/22      PT LONG TERM GOAL #4   Title Pt will decrease 5TSTS by at least 3 seconds in order to demonstrate clinically significant improvement in LE strength.    Baseline Eval= 23.3 sec with BUE Support; 5/10: 20sec c chair arms; 17.623 sec from chair + airex    Time 12    Period Weeks    Status Achieved    Target Date 04/25/22      PT LONG TERM GOAL #5   Title Pt will decrease TUG to below 20 seconds/decrease in order to demonstrate decreased fall risk.    Baseline  Eval=  31.44 with use of RW; 04/03/22: 18.96sec c SPC (carried not used)    Time 12    Period Weeks    Status On-going    Target Date 04/25/22      PT LONG TERM GOAL #6   Title Pt will increase 10MWT by at least 0.13 m/s in order to demonstrate clinically significant improvement in community ambulation.    Baseline Eval= 0.62 m/s using RW; 5/10: 0.60ms c SPC (carried not used)    Time 12    Period Weeks    Status Achieved    Target Date 04/25/22                   Plan - 04/17/22 1020     Clinical Impression Statement Patient presents with good motivation for completion of physical therapy session.  Patient continues to work on turning to the left with dynamic activities.  Patient's most difficult tasks with turning left is with his balance focusing on the right lower extremity and stability with picking up legs.  With practice patient did improve in his efficacy with turning but still has difficulty with lifting his feet and stability.  Patient will continue to benefit from skilled physical therapy in order to improve his lower extremity balance, stability, reduce his risk of falls.    Personal Factors and Comorbidities Age;Comorbidity 3+    Comorbidities Diabetes, CVA, HTN    Examination-Activity Limitations Caring for Others;Lift;Squat;Stairs;Transfers    Examination-Participation Restrictions Cleaning;Community Activity;Yard Work    SMerchant navy officerEvolving/Moderate complexity    Rehab Potential Good    PT Frequency 2x / week    PT Duration 12 weeks    PT Treatment/Interventions ADLs/Self Care Home Management;Canalith Repostioning;Cryotherapy;Electrical Stimulation;Moist Heat;Traction;Ultrasound;DME Instruction;Gait training;Stair training;Functional mobility training;Therapeutic activities;Therapeutic exercise;Balance training;Neuromuscular re-education;Patient/family education;Manual techniques;Passive range of motion;Dry needling;Vestibular    PT Next Visit Plan  Left turning, soft surfaces, semitanden, frontal plan mobility, update HEP    PT Home Exercise Plan Access Code: [email or web address]  URL: [email or web address]; 04/03/22: wife and pt reports not having handout since first visit; likely in need of updates next session.    Consulted and Agree with Plan of Care Patient             Patient will benefit from skilled therapeutic intervention in order to improve the following deficits and impairments:  Abnormal gait, Decreased activity tolerance, Decreased balance, Decreased coordination, Decreased endurance, Decreased mobility, Decreased range of motion, Decreased strength, Difficulty walking, Hypomobility, Impaired flexibility  Visit Diagnosis: Unsteadiness on feet  Abnormality of gait and mobility  Difficulty in walking, not elsewhere classified  Muscle weakness (generalized)     Problem List Patient Active Problem List   Diagnosis Date Noted   HLD (hyperlipidemia) 01/21/2022   Essential tremor 01/21/2022   Stroke-like episode    Overweight (BMI 25.0-29.9) 10/11/2021   Hypertriglyceridemia 10/11/2021   Stroke-like symptom 10/10/2021   Diabetes [place] 147/65/4650  HTN (hypertension) 10/10/2021   Pyuria 12/25/2016   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   Medicare annual wellness visit, initial 07/17/2016   B12 deficiency 07/17/2016   History of CVA (cerebrovascular accident) without residual deficits 05/14/2016   PIN III (prostatic intraepithelial neoplasia III) 04/11/2015   Personal history of other malignant neoplasm of skin 09/16/2014   History of nonmelanoma skin cancer 09/16/2014   History of colonic polyps 08/23/2014   Diverticulosis of large intestine 08/23/2014   Benign hematuria 07/20/2014   [place] 07/04/[place]   Malignant neoplasm  of bladder ([place]) 07/04/2014   DM (diabetes mellitus) type II controlled with renal manifestation ([place]) 07/04/2014   Benign essential  hypertension 07/04/2014   Urge incontinence 12/29/2013   Malignant neoplasm of lateral wall of urinary bladder ([place]) 09/09/2012   Incomplete emptying of bladder 09/09/2012   Enlarged prostate with lower urinary tract symptoms (LUTS) 09/09/2012   Elevated prostate specific antigen (PSA) 09/09/2012   Chronic prostatitis 09/09/2012   Carcinoma in situ of prostate 09/09/2012   Kidney stone 09/09/2012   Benign prostatic hyperplasia with urinary obstruction 09/09/2012    Particia Lather, PT 04/17/2022, 12:32 PM  [place] MAIN [facility] SERVICES [address] [place], [place], [address] Phone: [phone]   Fax:  [phone]  Name: JAKWON GAYTON MRN: [id number] Date of Birth: 1934-02-09

## 2022-04-24 ENCOUNTER — Encounter: Payer: Medicare Other | Admitting: Speech Pathology

## 2022-04-24 ENCOUNTER — Ambulatory Visit: Payer: Medicare Other

## 2022-04-24 DIAGNOSIS — R269 Unspecified abnormalities of gait and mobility: Secondary | ICD-10-CM

## 2022-04-24 DIAGNOSIS — R262 Difficulty in walking, not elsewhere classified: Secondary | ICD-10-CM

## 2022-04-24 DIAGNOSIS — R2681 Unsteadiness on feet: Secondary | ICD-10-CM

## 2022-04-24 DIAGNOSIS — M6281 Muscle weakness (generalized): Secondary | ICD-10-CM

## 2022-04-24 DIAGNOSIS — R278 Other lack of coordination: Secondary | ICD-10-CM

## 2022-04-24 DIAGNOSIS — R471 Dysarthria and anarthria: Secondary | ICD-10-CM | POA: Diagnosis not present

## 2022-04-24 NOTE — Therapy (Signed)
[place] MAIN [facility] SERVICES [address] [place], [place], [address] Phone: [phone]   Fax:  [phone]  Physical Therapy Treatment/PT Discharge Summary  Patient Details  Name: Casey Adkins MRN: [id number] Date of Birth: [birth year] Referring Provider (PT): Dr. Emily Filbert   Encounter Date: 04/24/2022   PT End of Session - 04/24/22 1023     Visit Number 13    Number of Visits 25    Date for PT Re-Evaluation 04/25/22    Authorization Type [employer] Medicare    Authorization Time Period initial cert 01/31/2504- 02/01/7672    Progress Note Due on Visit 20    PT Start Time 1017    PT Stop Time 1059    PT Time Calculation (min) 42 min    Equipment Utilized During Treatment Gait belt    Activity Tolerance Patient tolerated treatment well;No increased pain;Patient limited by fatigue    Behavior During Therapy [facility] for tasks assessed/performed             Past Medical History:  Diagnosis Date   Allergic rhinitis    Arthritis    Cancer ([place])    Bladder Cancer   Cancer [facility]    Prostate Cancer   Diabetes mellitus without complication ([place])    Diverticulosis    Dysphagia    GERD (gastroesophageal reflux disease)    History of kidney stones    h/o   Hypertension    Stroke [facility]) 2006   possible light stroke-no deficits    Past Surgical History:  Procedure Laterality Date   CYSTOSCOPY W/ RETROGRADES Bilateral 06/07/2019   Procedure: CYSTOSCOPY WITH RETROGRADE PYELOGRAM;  Surgeon: Hollice Espy, MD;  Location: [facility] ORS;  Service: Urology;  Laterality: Bilateral;   CYSTOSCOPY WITH BIOPSY N/A 06/07/2019   Procedure: CYSTOSCOPY WITH Bladder BIOPSY;  Surgeon: Hollice Espy, MD;  Location: [facility] ORS;  Service: Urology;  Laterality: N/A;   ESOPHAGOGASTRODUODENOSCOPY N/A 04/21/2015   Procedure: ESOPHAGOGASTRODUODENOSCOPY (EGD);  Surgeon: Hulen Luster, MD;  Location: [facility] ENDOSCOPY;  Service: Gastroenterology;  Laterality: N/A;   LITHOTRIPSY      SINUS SURGERY WITH INSTATRAK  2008   STAPEDES SURGERY Left age 36   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   Subjective Assessment - 04/24/22 1734     Subjective Patient reports he has been practicing his turning and feels he is doing much better overall. He states if he tests well he would be okay with discharge as he states he feels much stronger and moving around better and has returned to doing everything except bowling.    Patient is accompained by: Family member    Pertinent History Casey Adkins is an 88 y.o. male referred by his PCP Dr. Emily Filbert for weakness. Pt was admitted on 10/10/21- 10/11/21 for CVA. 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 Unable to obtain MRI due to ear implant. Hx of esophageal dysphagia requiring dilation in the past; reports 1 year hx of worsening dysphagia prior to CVA.    Currently in Pain? No/denies            Reassessed remaining goals for discharge visit   HEP-Patient reports working on walking, turning to left, and standing at sink, sit to stand  FOTO= 69   5 time STS= 15.93 without UE Support (  GOAL MET)   TUG= 14.60 sec without an AD (GOAL MET)    10 MWT= 10.17 and 10.4 sec = 0.97 m/s avg (GOAL MET)                   PT Education - 04/24/22 1735     Education Details Review of HEP    Person(s) Educated Patient    Methods Explanation    Comprehension Verbalized understanding              PT Short Term Goals - 04/24/22 1024       PT SHORT TERM GOAL #1   Title Pt will be independent with initial HEP in order to improve strength and balance in order to decrease fall risk and improve function at home and work.    Baseline Eval= No formal HEP in place; 5/10: lost handout, not consistently doing any prescibed HEP; 04/24/2022- Patient reports  working on walking, turning to left, and standing at sink, sit to stand.    Time 6    Period Weeks    Status Achieved    Target Date 03/14/22               PT Long Term Goals - 04/24/22 1026       PT LONG TERM GOAL #1   Title Pt will be independent with final HEP in order to improve strength and balance in order to decrease fall risk and improve function at home and work.    Baseline Eval= No formal HEP in place; 04/24/2022- Patient reports working on walking, turning to left, and standing at sink, sit to stand    Time 12    Period Weeks    Status Achieved    Target Date 04/25/22      PT LONG TERM GOAL #2   Title Pt will improve FOTO to target score of 71 to display perceived improvements in ability to complete ADL's.    Baseline Eval= 60; 5/10: 56; 04/24/2022=69    Time 12    Period Weeks    Status Partially Met    Target Date 04/25/22      PT LONG TERM GOAL #3   Title Pt will improve BERG by at least 3 points in order to demonstrate clinically significant improvement in balance.    Baseline Eval= 36; 04/03/22: 46/56    Time 12    Period Weeks    Status New    Target Date 04/25/22      PT LONG TERM GOAL #4   Title Pt will decrease 5TSTS by at least 3 seconds in order to demonstrate clinically significant improvement in LE strength.    Baseline Eval= 23.3 sec with BUE Support; 5/10: 20sec c chair arms; 17.623 sec from chair + airex; 04/24/2022=15.93 without UE Support    Time 12    Period Weeks    Status Achieved    Target Date 04/25/22      PT LONG TERM GOAL #5   Title Pt will decrease TUG to below 20 seconds/decrease in order to demonstrate decreased fall risk.    Baseline Eval= 31.44 with use of RW; 04/03/22: 18.96sec c SPC (carried not used); 04/24/2022= 14.60 sec without an AD    Time 12    Period Weeks    Status Achieved    Target Date 04/25/22      PT LONG TERM GOAL #6   Title Pt will increase 10MWT by at least 0.13 m/s in order to  demonstrate clinically  significant improvement in community ambulation.    Baseline Eval= 0.62 m/s using RW; 5/10: 0.19ms c SPC (carried not used); 04/24/2022= 0.97 m/s avg    Time 12    Period Weeks    Status Achieved    Target Date 04/25/22                   Plan - 04/24/22 1736     Clinical Impression Statement Patient presents for last remaining PT visit and performed well with all functional outcome measures. He presents today with improved self-perceived abilities- as seen by improved FOTO score. He reports basically back to his prior level of function except for bowling. He is demo improved overall LE strength as seen by improved 5 x STS. He is walking better - no AD and met his TUG and 10 meter walk test indicating a decreased risk of falling. He has met all established goals and appropriate for discharge from skilled PT services at this time. He is to continue with his prescribed HEP on his own.    Personal Factors and Comorbidities Age;Comorbidity 3+    Comorbidities Diabetes, CVA, HTN    Examination-Activity Limitations Caring for Others;Lift;Squat;Stairs;Transfers    Examination-Participation Restrictions Cleaning;Community Activity;Yard Work    SMerchant navy officerEvolving/Moderate complexity    Rehab Potential Good    PT Frequency 2x / week    PT Duration 12 weeks    PT Treatment/Interventions ADLs/Self Care Home Management;Canalith Repostioning;Cryotherapy;Electrical Stimulation;Moist Heat;Traction;Ultrasound;DME Instruction;Gait training;Stair training;Functional mobility training;Therapeutic activities;Therapeutic exercise;Balance training;Neuromuscular re-education;Patient/family education;Manual techniques;Passive range of motion;Dry needling;Vestibular    PT Next Visit Plan Left turning, soft surfaces, semitanden, frontal plan mobility, update HEP    PT Home Exercise Plan Access Code: [email or web address]  URL: [email or web address]; 04/03/22: wife and pt reports not  having handout since first visit; likely in need of updates next session.    Consulted and Agree with Plan of Care Patient             Patient will benefit from skilled therapeutic intervention in order to improve the following deficits and impairments:  Abnormal gait, Decreased activity tolerance, Decreased balance, Decreased coordination, Decreased endurance, Decreased mobility, Decreased range of motion, Decreased strength, Difficulty walking, Hypomobility, Impaired flexibility  Visit Diagnosis: Unsteadiness on feet  Abnormality of gait and mobility  Difficulty in walking, not elsewhere classified  Muscle weakness (generalized)  Other lack of coordination     Problem List Patient Active Problem List   Diagnosis Date Noted   HLD (hyperlipidemia) 01/21/2022   Essential tremor 01/21/2022   Stroke-like episode    Overweight (BMI 25.0-29.9) 10/11/2021   Hypertriglyceridemia 10/11/2021   Stroke-like symptom 10/10/2021   Diabetes mellitus without complication ([place] 129/92/4268  HTN (hypertension) 10/10/2021   Pyuria 12/25/2016   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   Medicare annual wellness visit, initial 07/17/2016   B12 deficiency 07/17/2016   History of CVA (cerebrovascular accident) without residual deficits 05/14/2016   PIN III (prostatic intraepithelial neoplasia III) 04/11/2015   Personal history of other malignant neoplasm of skin 09/16/2014   History of nonmelanoma skin cancer 09/16/2014   History of colonic polyps 08/23/2014   Diverticulosis of large intestine 08/23/2014   Benign hematuria 07/20/2014   Type 2 diabetes mellitus ([place] 07/04/2014   Malignant neoplasm of bladder ([place] 07/04/2014   DM (diabetes mellitus) type II controlled with renal manifestation ([place] 07/04/2014   Benign essential hypertension 07/04/2014   Urge incontinence 12/29/2013   Malignant neoplasm of lateral wall of urinary bladder (  [place]) 09/09/2012   Incomplete emptying of bladder  09/09/2012   Enlarged prostate with lower urinary tract symptoms (LUTS) 09/09/2012   Elevated prostate specific antigen (PSA) 09/09/2012   Chronic prostatitis 09/09/2012   Carcinoma in situ of prostate 09/09/2012   Kidney stone 09/09/2012   Benign prostatic hyperplasia with urinary obstruction 09/09/2012    Lewis Moccasin, PT 04/24/2022, 5:42 PM  [place] MAIN [facility] SERVICES [address] [place], [place], [address] Phone: [phone]   Fax:  [phone]  Name: Casey Adkins MRN: [id number] Date of Birth: August 17, 1934

## 2022-04-26 ENCOUNTER — Ambulatory Visit: Payer: Medicare Other

## 2022-04-29 ENCOUNTER — Ambulatory Visit: Payer: Medicare Other | Admitting: Physical Therapy

## 2022-04-29 ENCOUNTER — Encounter: Payer: Medicare Other | Admitting: Speech Pathology

## 2022-05-01 ENCOUNTER — Encounter: Payer: Medicare Other | Admitting: Speech Pathology

## 2022-05-01 ENCOUNTER — Ambulatory Visit: Payer: Medicare Other | Admitting: Physical Therapy

## 2022-05-06 ENCOUNTER — Ambulatory Visit: Payer: Medicare Other | Admitting: Physical Therapy

## 2022-05-06 ENCOUNTER — Encounter: Payer: Medicare Other | Admitting: Speech Pathology

## 2022-05-08 ENCOUNTER — Encounter: Payer: Medicare Other | Admitting: Speech Pathology

## 2022-05-08 ENCOUNTER — Ambulatory Visit: Payer: Medicare Other | Admitting: Physical Therapy

## 2022-05-13 ENCOUNTER — Ambulatory Visit: Payer: Medicare Other | Admitting: Physical Therapy

## 2022-05-13 ENCOUNTER — Encounter: Payer: Medicare Other | Admitting: Speech Pathology

## 2022-05-15 ENCOUNTER — Encounter: Payer: Medicare Other | Admitting: Speech Pathology

## 2022-05-15 ENCOUNTER — Ambulatory Visit: Payer: Medicare Other | Admitting: Physical Therapy

## 2022-05-15 ENCOUNTER — Other Ambulatory Visit: Payer: Self-pay | Admitting: Urology

## 2022-05-20 ENCOUNTER — Ambulatory Visit: Payer: Medicare Other | Admitting: Physical Therapy

## 2022-05-20 ENCOUNTER — Encounter: Payer: Medicare Other | Admitting: Speech Pathology

## 2022-05-22 ENCOUNTER — Ambulatory Visit: Payer: Medicare Other | Admitting: Physical Therapy

## 2022-05-22 ENCOUNTER — Encounter: Payer: Medicare Other | Admitting: Speech Pathology

## 2022-05-27 ENCOUNTER — Encounter: Payer: Medicare Other | Admitting: Speech Pathology

## 2022-05-29 ENCOUNTER — Ambulatory Visit: Payer: Medicare Other | Admitting: Physical Therapy

## 2022-05-29 ENCOUNTER — Encounter: Payer: Medicare Other | Admitting: Speech Pathology

## 2022-06-03 ENCOUNTER — Encounter: Payer: Medicare Other | Admitting: Speech Pathology

## 2022-06-05 ENCOUNTER — Encounter: Payer: Medicare Other | Admitting: Speech Pathology

## 2022-06-05 ENCOUNTER — Ambulatory Visit: Payer: Medicare Other | Admitting: Physical Therapy

## 2022-06-10 ENCOUNTER — Encounter: Payer: Medicare Other | Admitting: Speech Pathology

## 2022-06-12 ENCOUNTER — Ambulatory Visit: Payer: Medicare Other | Admitting: Physical Therapy

## 2022-06-12 ENCOUNTER — Encounter: Payer: Medicare Other | Admitting: Speech Pathology

## 2022-06-17 ENCOUNTER — Encounter: Payer: Medicare Other | Admitting: Speech Pathology

## 2022-06-19 ENCOUNTER — Encounter: Payer: Medicare Other | Admitting: Speech Pathology

## 2022-06-19 ENCOUNTER — Ambulatory Visit: Payer: Medicare Other | Admitting: Physical Therapy

## 2022-06-24 ENCOUNTER — Encounter: Payer: Medicare Other | Admitting: Speech Pathology

## 2022-06-26 ENCOUNTER — Encounter: Payer: Medicare Other | Admitting: Speech Pathology

## 2022-06-26 ENCOUNTER — Ambulatory Visit: Payer: Medicare Other | Admitting: Physical Therapy

## 2022-07-01 ENCOUNTER — Encounter: Payer: Medicare Other | Admitting: Speech Pathology

## 2022-07-03 ENCOUNTER — Encounter: Payer: Medicare Other | Admitting: Speech Pathology

## 2022-07-03 ENCOUNTER — Ambulatory Visit: Payer: Medicare Other | Admitting: Physical Therapy

## 2022-07-08 ENCOUNTER — Encounter: Payer: Medicare Other | Admitting: Speech Pathology

## 2022-07-10 ENCOUNTER — Encounter: Payer: Medicare Other | Admitting: Speech Pathology

## 2022-07-10 ENCOUNTER — Ambulatory Visit: Payer: Medicare Other | Admitting: Physical Therapy

## 2022-07-15 ENCOUNTER — Encounter: Payer: Medicare Other | Admitting: Speech Pathology

## 2022-07-17 ENCOUNTER — Ambulatory Visit: Payer: Medicare Other | Admitting: Physical Therapy

## 2022-07-17 ENCOUNTER — Encounter: Payer: Medicare Other | Admitting: Speech Pathology

## 2022-07-24 ENCOUNTER — Encounter: Payer: Medicare Other | Admitting: Speech Pathology

## 2022-10-22 ENCOUNTER — Ambulatory Visit
Admission: RE | Admit: 2022-10-22 | Discharge: 2022-10-22 | Disposition: A | Discharge status: Home / Self Care | Payer: Medicare Other | Source: Ambulatory Visit | Attending: Internal Medicine | Admitting: Internal Medicine

## 2022-10-22 ENCOUNTER — Other Ambulatory Visit: Payer: Self-pay | Admitting: Internal Medicine

## 2022-10-22 DIAGNOSIS — I6389 Other cerebral infarction: Secondary | ICD-10-CM | POA: Diagnosis present

## 2023-03-18 ENCOUNTER — Ambulatory Visit: Payer: Medicare Other | Admitting: Urology

## 2023-03-18 VITALS — BP 174/82 | HR 52 | Ht 70.0 in | Wt 191.6 lb

## 2023-03-18 DIAGNOSIS — Z8551 Personal history of malignant neoplasm of bladder: Secondary | ICD-10-CM

## 2023-03-18 DIAGNOSIS — R3915 Urgency of urination: Secondary | ICD-10-CM | POA: Diagnosis not present

## 2023-03-18 DIAGNOSIS — R82998 Other abnormal findings in urine: Secondary | ICD-10-CM | POA: Diagnosis not present

## 2023-03-18 LAB — URINALYSIS, COMPLETE
Bilirubin, UA: NEGATIVE
Glucose, UA: NEGATIVE
Ketones, UA: NEGATIVE
Nitrite, UA: NEGATIVE
Specific Gravity, UA: 1.025 (ref 1.005–1.030)
Urobilinogen, Ur: 1 mg/dL (ref 0.2–1.0)
pH, UA: 5.5 (ref 5.0–7.5)

## 2023-03-18 LAB — MICROSCOPIC EXAMINATION: RBC, Urine: >30 /hpf — AB (ref 0–2)

## 2023-03-18 MED ORDER — CEPHALEXIN 500 MG PO CAPS: 500.0000 mg | ORAL_CAPSULE | Freq: Three times a day (TID) | ORAL | 0 refills | Status: DC

## 2023-03-18 NOTE — Progress Notes (Signed)
   03/19/2022 CC:  Chief Complaint  Patient presents with   Cysto    HPI: Casey Adkins is a 89 y.o. male  with a personal history of bladder cancer, elevated PSA, and urinary urgency, who presents today for cystoscopy.   Unfortunately today, his urinalysis is frankly positive today, greater than 30 red blood cells, 6-10 white blood cells and many bacteria.  It is nitrite negative.  He denies any change in his urinary habits but does report that he had some gross hematuria a few days ago which spontaneously resolved.  He did have a urinalysis in November which was negative.  This is a distinct change.   NED. A&Ox3.   No respiratory distress   Abd soft, NT, ND   Assessment/ Plan:  1. History of CIS (carcinoma in situ of bladder) -Lan to defer cystoscopy today in light of probable UTI  2.  Acute UTI/hematuria  -Urine culture today  -Will treat with Keflex for 7 days  -Consider additional upper tract imaging if his urinalysis does not clear with antibiotics in light of episode of painless gross hematuria and history of bladder CA  Reschedule cysto   Vanna Scotland, MD

## 2023-03-21 ENCOUNTER — Other Ambulatory Visit: Payer: Self-pay

## 2023-03-21 DIAGNOSIS — N3001 Acute cystitis with hematuria: Secondary | ICD-10-CM

## 2023-03-21 LAB — CULTURE, URINE COMPREHENSIVE

## 2023-03-21 MED ORDER — CIPROFLOXACIN HCL 500 MG PO TABS
500.0000 mg | ORAL_TABLET | Freq: Two times a day (BID) | ORAL | 0 refills | Status: DC
Start: 2023-03-21 — End: 2023-04-01

## 2023-03-21 NOTE — Progress Notes (Signed)
Casey Scotland, MD  Domingo Cocking V, CMA Please call and switch over his antibiotics to Cipro 500 mg twice daily for 7 days.  Patient aware

## 2023-03-22 LAB — CULTURE, URINE COMPREHENSIVE

## 2023-03-24 IMAGING — CT CT HEAD W/O CM
4 series · 16 of 47 positions shown, 18 images · non-contrast
Comparison: October 10, 2021.

CLINICAL DATA: Slurred speech, right-sided facial droop.

EXAM:
CT HEAD WITHOUT CONTRAST
TECHNIQUE: Contiguous axial images were obtained from the base of the skull
through the vertex without intravenous contrast.

[Series 2: head bone · axial · 0.44mm/px · z∈[+326,+358]mm · 3 of 83 slices shown]
[im 9/83  bone]
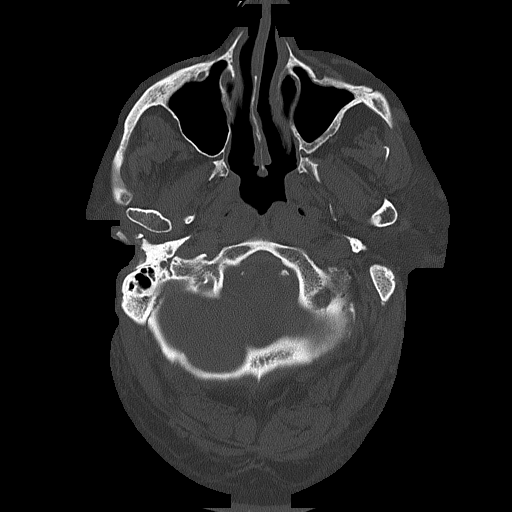
[im 17/83  bone]
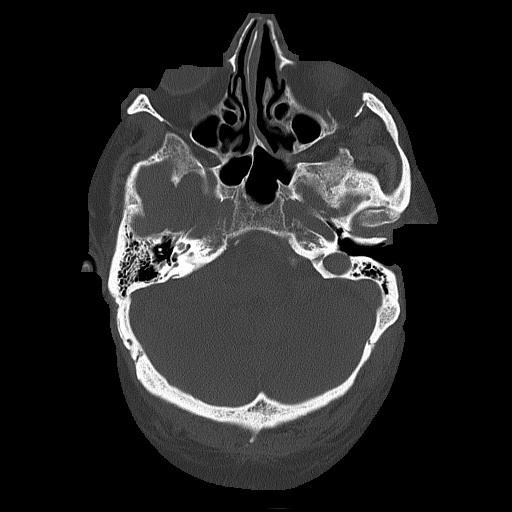
[im 25/83  bone]
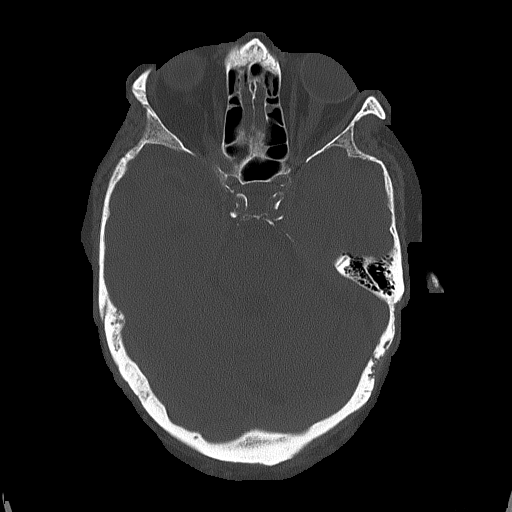

[Series 3: head wo · axial · 0.44mm/px · z∈[+330,+450]mm · 7 of 34 slices shown, 9 images]
[im 5/34  brain]
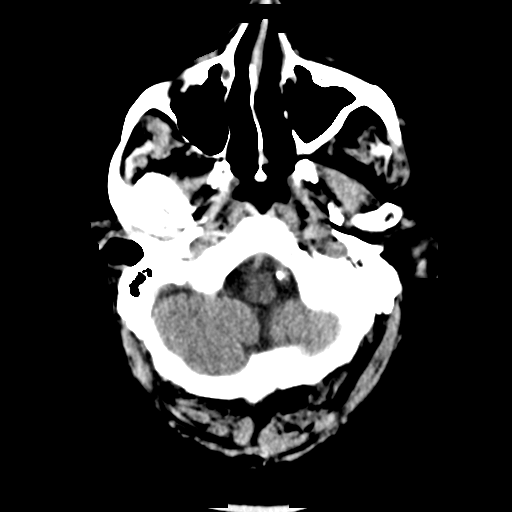
[im 5/34  bone]
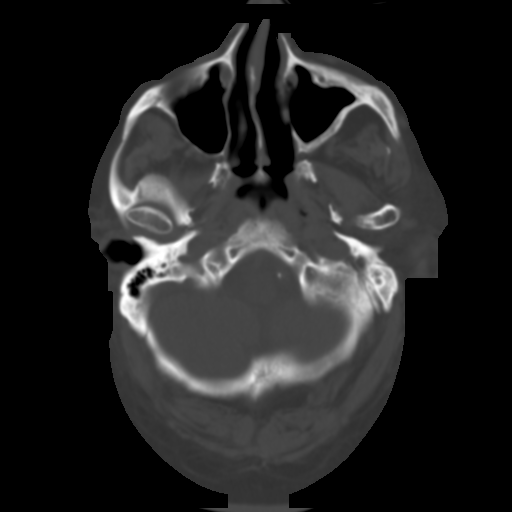
[im 9/34  brain]
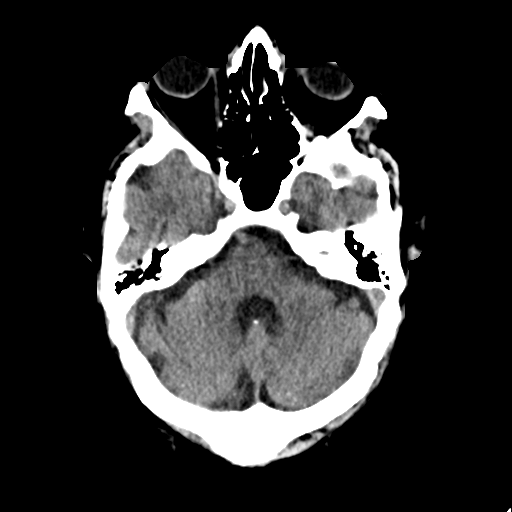
[im 13/34  brain]
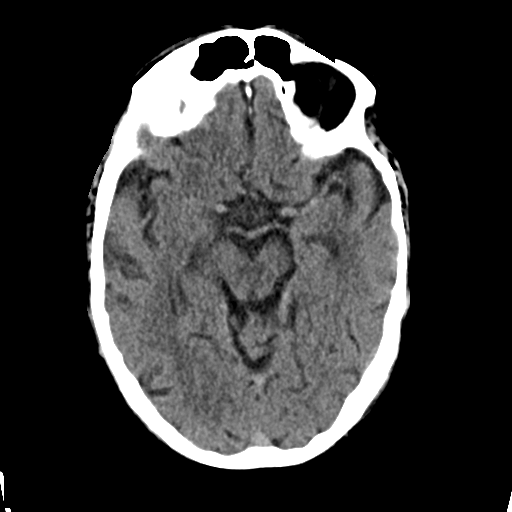
[im 17/34  brain]
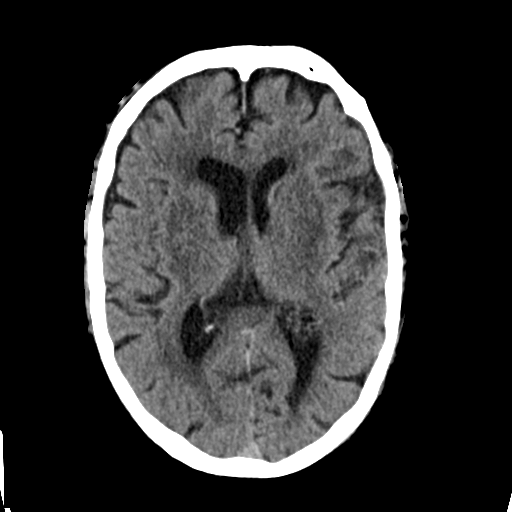
[im 21/34  brain]
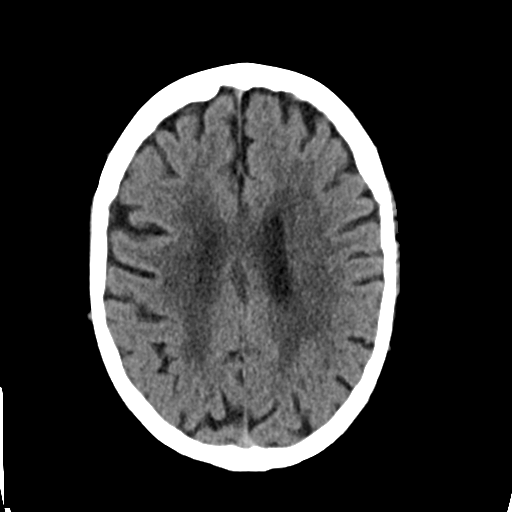
[im 21/34  bone]
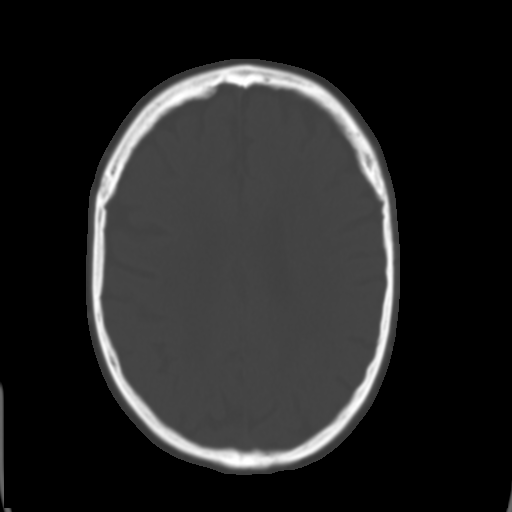
[im 25/34  brain]
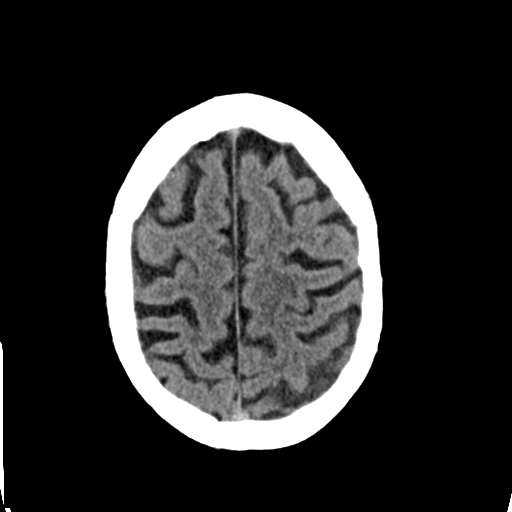
[im 29/34  brain]
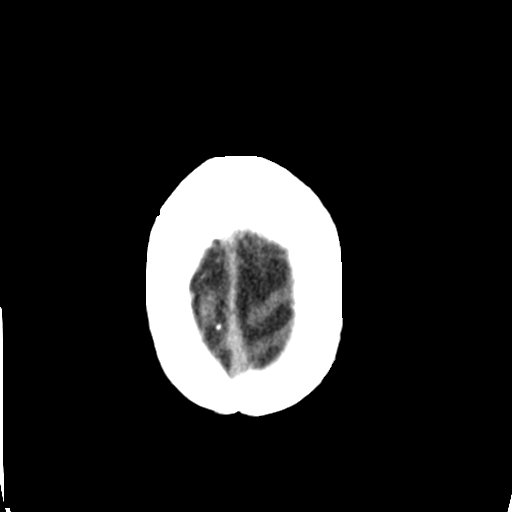

[Series 4: coronal soft tissue · coronal · 0.31mm/px · 3 of 70 slices shown]
[im 24/70  brain]
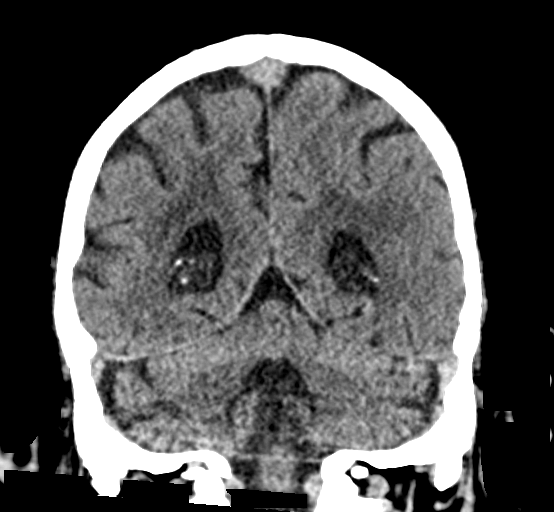
[im 31/70  brain]
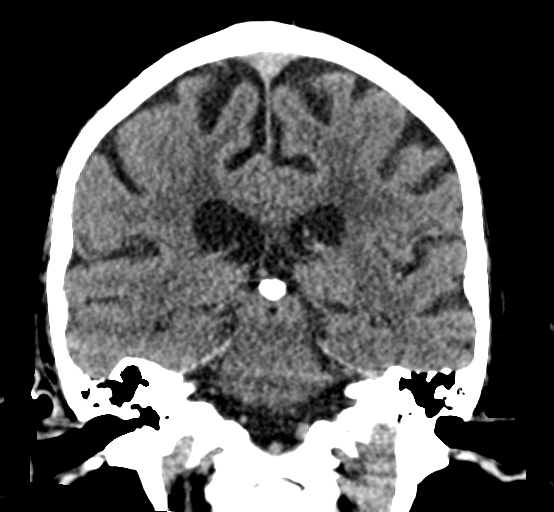
[im 39/70  brain]
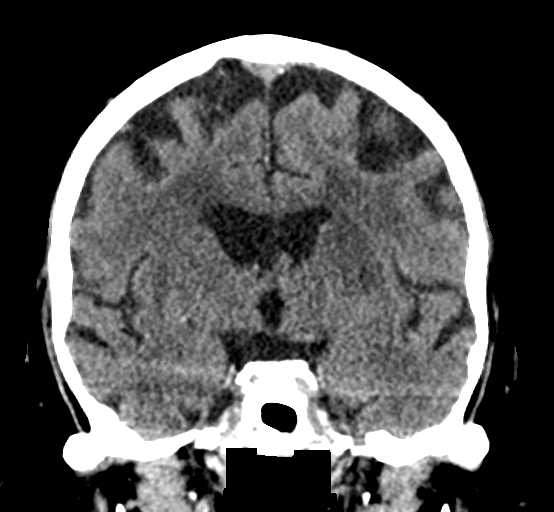

[Series 5: sagittal soft tissue · sagittal · 0.31mm/px · 3 of 58 slices shown]
[im 20/58  brain]
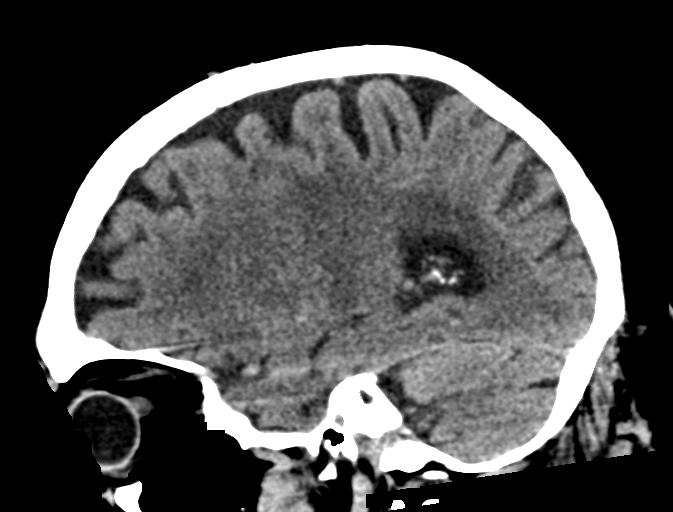
[im 29/58  brain]
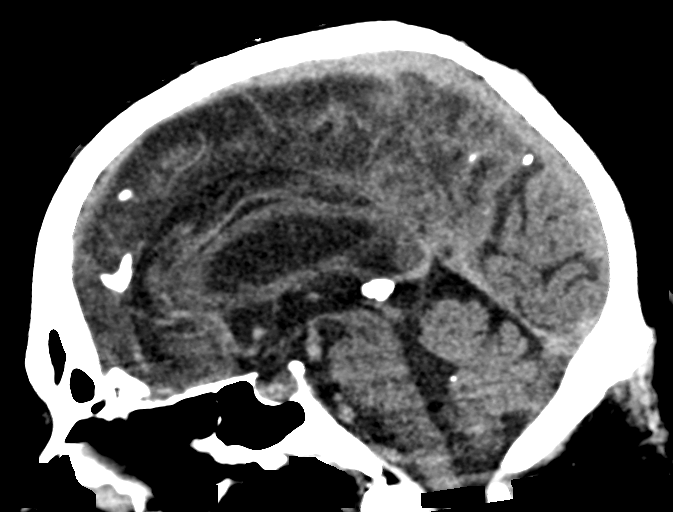
[im 39/58  brain]
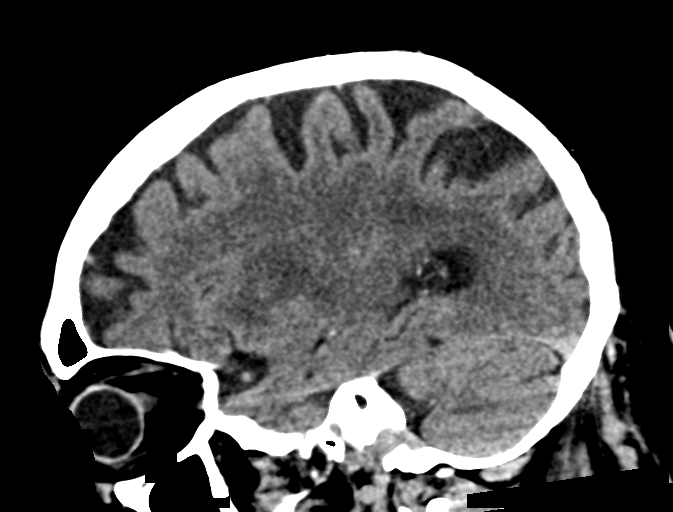

[16 of 47 positions shown; findings below may reference images not displayed]

FINDINGS: Brain: Mild chronic ischemic white matter disease is noted. No mass
effect or midline shift is noted. Ventricular size is within normal
limits. There is no evidence of mass lesion, hemorrhage or acute
infarction.

Vascular: No hyperdense vessel or unexpected calcification.

Skull: Normal. Negative for fracture or focal lesion.

Sinuses/Orbits: No acute finding.

Other: None.
IMPRESSION: No acute intracranial abnormality seen.

## 2023-04-01 ENCOUNTER — Ambulatory Visit: Payer: Medicare Other | Admitting: Urology

## 2023-04-01 VITALS — BP 178/78 | HR 69 | Ht 70.0 in | Wt 193.0 lb

## 2023-04-01 DIAGNOSIS — R3129 Other microscopic hematuria: Secondary | ICD-10-CM

## 2023-04-01 DIAGNOSIS — Z8551 Personal history of malignant neoplasm of bladder: Secondary | ICD-10-CM | POA: Diagnosis not present

## 2023-04-01 DIAGNOSIS — N3941 Urge incontinence: Secondary | ICD-10-CM

## 2023-04-01 DIAGNOSIS — N3001 Acute cystitis with hematuria: Secondary | ICD-10-CM

## 2023-04-01 DIAGNOSIS — R3915 Urgency of urination: Secondary | ICD-10-CM

## 2023-04-01 LAB — MICROSCOPIC EXAMINATION: RBC, Urine: >30 /hpf — AB (ref 0–2)

## 2023-04-01 LAB — URINALYSIS, COMPLETE
Bilirubin, UA: NEGATIVE
Glucose, UA: NEGATIVE
Ketones, UA: NEGATIVE
Leukocytes,UA: NEGATIVE
Nitrite, UA: NEGATIVE
Specific Gravity, UA: >1.03 — ABNORMAL HIGH (ref 1.005–1.030)
Urobilinogen, Ur: 0.2 mg/dL (ref 0.2–1.0)
pH, UA: 5.5 (ref 5.0–7.5)

## 2023-04-01 MED ORDER — MIRABEGRON ER 50 MG PO TB24
50.0000 mg | ORAL_TABLET | Freq: Every day | ORAL | 11 refills | Status: DC
Start: 2023-04-01 — End: 2024-05-18

## 2023-04-01 NOTE — Progress Notes (Signed)
Casey Adkins presents for a procedure visit. BP today is __178/78_________. He is complaint/ with BP medication. Greater than 140/90. Provider  notified. Pt advised to___f/u with PCP___________. Pt voiced understanding.

## 2023-04-01 NOTE — Progress Notes (Signed)
   03/19/2022 CC:  Chief Complaint  Patient presents with   Cysto    HPI: Casey Adkins is a 89 y.o. male  with a personal history of bladder cancer, elevated PSA, and urinary urgency, who presents today for cystoscopy.   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    He has since started BCG and has had 2 instillations with Dr. Achilles Dunk. This course was started on 12/04/2016 and a second dose was administered on 12/18/2016 then transferred his care to [facility] and complete the course which finished on 01/22/17. He completed maintenance BCG in 04/2017 and 08/2017.    Recent return to OR on 06/07/2019 for biopsy of erythematous patch.  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   He reports today that he continues to have issues with urgency frequency.  In the past, has been on various drugs.  He has not been taking any medications recently.  He wonders if he needs to go back on either [place] or Myrbetriq.  At nighttime, he sleeps to the night but has urgency frequency during the daytime.  This does have greater than 30 red blood cells otherwise unremarkable today.  Vitals:   04/01/23 1539  BP: (!) 184/78  Pulse: 69   NED. A&Ox3.   No respiratory distress   Abd soft, NT, ND Normal phallus with bilateral descended testicles  Cystoscopy Procedure Note  Patient identification was confirmed, informed consent was obtained, and patient was prepped using Betadine solution.  Lidocaine jelly was administered per urethral meatus.     Pre-Procedure: - Inspection reveals a normal caliber ureteral meatus.  Scant blood at the meatus.  Procedure: The flexible cystoscope was introduced  without difficulty - No urethral strictures/lesions are present. - Enlarged prostate  - Normal bladder neck - Bilateral ureteral orifices identified - Bladder mucosa  reveals no ulcers, tumors, or lesions - Stellate scars on left lateral bladder wall - No bladder stones - mild trabeculation  Retroflexion shows unremarkable    Post-Procedure: - Patient tolerated the procedure well   Assessment/ Plan:  1. History of CIS (carcinoma in situ of bladder) - NED  -Continue annual cystoscopy  2. Urinary urgency and incontinence  Will prescribe Myrbetriq 50 mg as this seems to be a better option based on his formulary  3.  Microscopic hematuria -Plan for upper tract ultrasound to rule out underlying pathology, mentions he has been having some left flank pain recently -Escalate imaging is appropriate  F/u cysto in 1 year, call with RUS results   Vanna Scotland, MD

## 2023-04-09 ENCOUNTER — Ambulatory Visit
Admission: RE | Admit: 2023-04-09 | Discharge: 2023-04-09 | Disposition: A | Discharge status: Home / Self Care | Payer: Medicare Other | Source: Ambulatory Visit | Attending: Urology | Admitting: Urology

## 2023-04-09 DIAGNOSIS — R3129 Other microscopic hematuria: Secondary | ICD-10-CM | POA: Insufficient documentation

## 2023-05-10 ENCOUNTER — Other Ambulatory Visit: Payer: Self-pay | Admitting: Urology

## 2023-05-12 NOTE — Telephone Encounter (Signed)
Patient states he is taking Finasteride and is taking Myrbetriq, he is asking if he should be on both medications?

## 2023-05-12 NOTE — Telephone Encounter (Signed)
Left detailed message for patient advising, ok per DPR on file

## 2024-02-03 ENCOUNTER — Ambulatory Visit: Admitting: Urology

## 2024-02-03 VITALS — BP 151/78 | HR 103

## 2024-02-03 DIAGNOSIS — R3915 Urgency of urination: Secondary | ICD-10-CM | POA: Diagnosis not present

## 2024-02-03 DIAGNOSIS — R350 Frequency of micturition: Secondary | ICD-10-CM

## 2024-02-03 DIAGNOSIS — N3001 Acute cystitis with hematuria: Secondary | ICD-10-CM | POA: Diagnosis not present

## 2024-02-03 DIAGNOSIS — Z8551 Personal history of malignant neoplasm of bladder: Secondary | ICD-10-CM

## 2024-02-03 LAB — URINALYSIS, COMPLETE
Glucose, UA: NEGATIVE
Ketones, UA: NEGATIVE
Leukocytes,UA: NEGATIVE
Specific Gravity, UA: >1.03 — ABNORMAL HIGH (ref 1.005–1.030)
Urobilinogen, Ur: 1 mg/dL (ref 0.2–1.0)
pH, UA: 5.5 (ref 5.0–7.5)

## 2024-02-03 LAB — MICROSCOPIC EXAMINATION: RBC, Urine: >30 /HPF — AB (ref 0–2)

## 2024-02-03 LAB — BLADDER SCAN AMB NON-IMAGING: Scan Result: 19

## 2024-02-03 NOTE — Progress Notes (Signed)
 I,Amy L Pierron,acting as a scribe for Vanna Scotland, MD.,have documented all relevant documentation on the behalf of Vanna Scotland, MD,as directed by  Vanna Scotland, MD while in the presence of Vanna Scotland, MD.  02/03/2024 3:40 PM   Sherrine Maples Michell Heinrich [id number] [phone]  Referring provider: Danella Penton, MD [phone] [facility] [address] [facility] [place],  [place] [address]  Chief Complaint  Patient presents with   Follow-up    HPI: 89 year-old male with a personal history of bladder cancer, urgency/ frequency, and elevated PSA presents today with intermittent gross hematuria.  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    He has since started BCG and has had 2 instillations with Dr. Achilles Dunk. This course was started on 12/04/2016 and a second dose was administered on 12/18/2016 then transferred his care to [facility] and complete the course which finished on 01/22/17. He completed maintenance BCG in 04/2017 and 08/2017.    Recent return to OR on 06/07/2019 for biopsy of erythematous patch.  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  His last cystoscopy in May 2024 was unremarkable. His last upper tract imaging in the form of renal ultrasound, also in May 2024, showed no hydronephrosis and an indeterminate 2 cm right hypoechoic mass.   Urinalysis today shows greater than 30 red blood cells per high powered field.   He is accompanied today with a couple family members.   He mentioned after his visit last May he bled for two days afterwards. And since then every month or so he passes blood clots.   He reports urgency and frequency, particularly in the morning.  At the beginning of the year when he went to pick up Myrbetriq from the pharmacy it was too costly so he didn't get it. However, he did start taking it again a week or so ago.  He also takes Finasteride. He is on Plavix, which may contribute to bleeding.   He has been experiencing burning with urination.   Additionally he has had sporadic weakness, fevers, and chills since Christmas. He is going to a neurologist for further evaluation of this.   PMH: Past Medical History:  Diagnosis Date   Allergic rhinitis    Arthritis    Cancer (HCC)    Bladder Cancer   Cancer [facility])    Prostate Cancer   Diabetes mellitus without complication (HCC)    Diverticulosis    Dysphagia    GERD (gastroesophageal reflux disease)    History of kidney stones    h/o   Hypertension    Stroke [facility]) 2006   possible light stroke-no deficits    Surgical History: Past Surgical History:  Procedure Laterality Date   CYSTOSCOPY W/ RETROGRADES Bilateral 06/07/2019   Procedure: CYSTOSCOPY WITH RETROGRADE PYELOGRAM;  Surgeon: Vanna Scotland, MD;  Location: [facility] ORS;  Service: Urology;  Laterality: Bilateral;   CYSTOSCOPY WITH BIOPSY N/A 06/07/2019   Procedure: CYSTOSCOPY WITH Bladder BIOPSY;  Surgeon: Vanna Scotland, MD;  Location: [facility] ORS;  Service: Urology;  Laterality: N/A;   ESOPHAGOGASTRODUODENOSCOPY N/A 04/21/2015   Procedure: ESOPHAGOGASTRODUODENOSCOPY (EGD);  Surgeon: Wallace Cullens, MD;  Location: [facility] ENDOSCOPY;  Service: Gastroenterology;  Laterality: N/A;   LITHOTRIPSY  SINUS SURGERY WITH INSTATRAK  2008   STAPEDES SURGERY Left age 58   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   Elemental Sulfur Swelling   In throat and tongue    Prilosec [omeprazole]    GI sxs   Atorvastatin Rash        Medication List        Accurate as of February 03, 2024  3:40 PM. If you have any questions, ask your nurse or doctor.          acetaminophen 500 MG tablet Commonly known as: TYLENOL Take 500 mg by mouth every 6 (six) hours as needed for moderate pain.   amLODipine 5 MG tablet Commonly known as: NORVASC Take 5 mg by mouth daily.   Calcium-D 600-400 MG-UNIT Tabs Take 1 tablet by mouth daily.   clopidogrel 75 MG tablet Commonly known as: PLAVIX Take 1 tablet (75 mg total) by mouth daily.   finasteride 5 MG tablet Commonly known as: PROSCAR TAKE 1 TABLET BY MOUTH EVERY DAY   glimepiride 4 MG tablet Commonly known as: AMARYL Take 1 tablet by mouth daily before breakfast.   losartan 50 MG tablet Commonly known as: COZAAR Take 50 mg by mouth daily.   mirabegron ER 50 MG Tb24 tablet Commonly known as: MYRBETRIQ Take 1 tablet (50 mg total) by mouth daily.   pioglitazone 15 MG tablet Commonly known as: ACTOS Take 15 mg by mouth daily.   PRESERVISION AREDS PO Take 1 tablet by mouth 2 (two) times daily.   primidone 50 MG tablet Commonly known as: MYSOLINE Take 25-50 mg by mouth 2 (two) times daily.   propranolol 20 MG tablet Commonly known as: INDERAL Take 20 mg by mouth 2 (two) times daily.   rosuvastatin 10 MG tablet Commonly known as: CRESTOR Take 10 mg by mouth daily.   vitamin B-12 500 MCG tablet Commonly known as: CYANOCOBALAMIN Take 500 mcg by mouth daily.        Allergies:  Allergies  Allergen Reactions   Sulfa Antibiotics Anaphylaxis    Tongue swelling.   Elemental Sulfur Swelling    In throat and tongue    Prilosec [Omeprazole]     GI sxs   Atorvastatin Rash    Family History: Family History  Problem Relation Age of Onset   Prostate cancer Neg Hx    Bladder Cancer Neg Hx    Kidney cancer Neg Hx     Social History:  reports that he quit smoking about 37 years ago. His smoking use included cigarettes. He started smoking about 67 years ago. He has a 60 pack-year smoking history. He  has never used smokeless tobacco. He reports that he does not drink alcohol and does not use drugs.   Physical Exam: BP (!) 151/78   Pulse (!) 103   Constitutional:  Alert and oriented, No acute distress. HEENT: [place] AT, moist mucus membranes.  Trachea midline, no masses. Neurologic: Grossly intact, no focal deficits, moving all 4 extremities. Psychiatric: Normal mood and affect.   Pertinent Imaging: Results for orders placed during the hospital encounter of 04/09/23  Ultrasound renal complete  Narrative CLINICAL DATA:  Microscopic hematuria  EXAM: RENAL / URINARY TRACT ULTRASOUND COMPLETE  COMPARISON:  CT abdomen 02/05/2012  FINDINGS: Right Kidney:  Renal  measurements: 11.1 x 6.0 x 5.3 cm = volume: 183.3 mL. Normal renal cortical thickness and echogenicity. No hydronephrosis. There is a 2.1 x 1.9 x 2.1 cm exophytic hypoechoic mass inferior pole.  Left Kidney:  Renal measurements: 12.5 x 6.3 x 5.5 cm = volume: 228.2 mL. Echogenicity within normal limits. No mass or hydronephrosis visualized.  Bladder:  Not fully distended.  Other:  None.  IMPRESSION: 1. No hydronephrosis. 2. Indeterminate hypoechoic mass inferior pole right kidney. Recommend further evaluation with pre and post contrast-enhanced abdominal MRI. 3. These results will be called to the ordering clinician or representative by the Radiologist Assistant, and communication documented in the PACS or [employer].  Electronically Signed By: Annia Belt M.D. On: 04/09/2023 15:06 Personally reviewed the above scan and agree with radiologic interpretation.   Assessment & Plan:    1. Possible cystitis versus prostatitis  -  Will send out urine for culture. He doesn't have any white blood cells or nitrates but given that he's having some pain will go ahead and treat him for presumed infection.  - Cystoscopy scheduled for May but will move up sooner.   2. Urinary Urgency and Frequency - The recent  lapse in taking Myrbetriq may have contributed to the worsening symptoms. Advise the patient to continue taking it for at least two more weeks to assess improvement. If symptoms persist, can consider alternative medications.  Return in about 2 weeks (around 02/17/2024).  I have reviewed the above documentation for accuracy and completeness, and I agree with the above.   Vanna Scotland, MD    [facility] [address], [address] [place], [place] [address] [phone]

## 2024-02-06 LAB — CULTURE, URINE COMPREHENSIVE

## 2024-02-10 ENCOUNTER — Ambulatory Visit: Admitting: Urology

## 2024-02-10 VITALS — BP 132/71 | HR 118 | Ht 70.0 in | Wt 175.2 lb

## 2024-02-10 DIAGNOSIS — Z8551 Personal history of malignant neoplasm of bladder: Secondary | ICD-10-CM

## 2024-02-10 DIAGNOSIS — R3129 Other microscopic hematuria: Secondary | ICD-10-CM

## 2024-02-10 LAB — URINALYSIS, COMPLETE
Bilirubin, UA: NEGATIVE
Glucose, UA: NEGATIVE
Ketones, UA: NEGATIVE
Leukocytes,UA: NEGATIVE
Nitrite, UA: NEGATIVE
Specific Gravity, UA: >1.03 — ABNORMAL HIGH (ref 1.005–1.030)
Urobilinogen, Ur: 0.2 mg/dL (ref 0.2–1.0)
pH, UA: 5.5 (ref 5.0–7.5)

## 2024-02-10 LAB — MICROSCOPIC EXAMINATION

## 2024-02-10 NOTE — Progress Notes (Unsigned)
   03/19/2022 CC:  Chief Complaint  Patient presents with   Cysto    HPI: Casey Adkins is a 89 y.o. male  with a personal history of bladder cancer, elevated PSA, and urinary urgency, who presents today for cystoscopy.   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    He has since started BCG and has had 2 instillations with Dr. Achilles Dunk. This course was started on 12/04/2016 and a second dose was administered on 12/18/2016 then transferred his care to [facility] and complete the course which finished on 01/22/17. He completed maintenance BCG in 04/2017 and 08/2017.    Recent return to OR on 06/07/2019 for biopsy of erythematous patch.  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     Vitals:   02/10/24 1319  BP: 132/71  Pulse: (!) 118   NED. A&Ox3.   No respiratory distress   Abd soft, NT, ND Normal phallus with bilateral descended testicles  Cystoscopy Procedure Note  Patient identification was confirmed, informed consent was obtained, and patient was prepped using Betadine solution.  Lidocaine jelly was administered per urethral meatus.     Pre-Procedure: - Inspection reveals a normal caliber ureteral meatus.  Scant blood at the meatus.  Procedure: The flexible cystoscope was introduced without difficulty - No urethral strictures/lesions are present. - Enlarged prostate  - Normal bladder neck - Bilateral ureteral orifices identified - Bladder mucosa with several stellate scars.  There is 1 area on the left lateral wall slightly more erythematous and focal.  Concerning for recurrent CIS versus cystitis. - Stellate scars on left lateral bladder wall - No bladder  stones - mild trabeculation  Retroflexion shows unremarkable    Post-Procedure: - Patient tolerated the procedure well   Assessment/ Plan:  1. History of CIS (carcinoma in situ of bladder)/ gross hematuria -Cystoscopy today with 1 somewhat suspicious area although may be related to recent infection  Had lengthy conversation with the patient as well as his wife and daughter who agree with a more conservative approach.  Will plan to rescoped him in about 6 weeks as well as obtain a urine cytology.  Unfortunately, he did not provide enough urine today for this, will collect at follow-up visit.  Will consider additional upper tract imaging after next visit in light of gross hematuria episodes pending results for repeat cystoscopy.  2. Urinary urgency and incontinence  Continue Myrbetriq 50 mg / finasteride   F/u cysto 6 weeks/ cytology   Vanna Scotland, MD

## 2024-02-11 ENCOUNTER — Other Ambulatory Visit: Payer: Self-pay | Admitting: Neurology

## 2024-02-11 DIAGNOSIS — R296 Repeated falls: Secondary | ICD-10-CM

## 2024-02-11 DIAGNOSIS — R569 Unspecified convulsions: Secondary | ICD-10-CM

## 2024-02-11 DIAGNOSIS — R2689 Other abnormalities of gait and mobility: Secondary | ICD-10-CM

## 2024-02-18 ENCOUNTER — Ambulatory Visit
Admission: RE | Admit: 2024-02-18 | Discharge: 2024-02-18 | Disposition: A | Discharge status: Home / Self Care | Source: Ambulatory Visit | Attending: Neurology | Admitting: Neurology

## 2024-02-18 DIAGNOSIS — R296 Repeated falls: Secondary | ICD-10-CM | POA: Insufficient documentation

## 2024-02-18 DIAGNOSIS — R569 Unspecified convulsions: Secondary | ICD-10-CM | POA: Diagnosis present

## 2024-02-18 DIAGNOSIS — R2689 Other abnormalities of gait and mobility: Secondary | ICD-10-CM | POA: Insufficient documentation

## 2024-03-31 ENCOUNTER — Other Ambulatory Visit: Payer: Self-pay | Admitting: Urology

## 2024-04-06 ENCOUNTER — Other Ambulatory Visit: Admitting: Urology

## 2024-05-25 DEATH — deceased
